# Patient Record
Sex: Male | Born: 1960 | Race: White | Hispanic: No | Smoking: Current every day smoker
Health system: Southern US, Community
[De-identification: ages and names within clinical notes are randomized; demographics above are authoritative.]

## PROBLEM LIST (undated history)

## (undated) DIAGNOSIS — F39 Unspecified mood [affective] disorder: Secondary | ICD-10-CM

## (undated) DIAGNOSIS — G0490 Encephalitis and encephalomyelitis, unspecified: Secondary | ICD-10-CM

## (undated) DIAGNOSIS — M25512 Pain in left shoulder: Secondary | ICD-10-CM

## (undated) DIAGNOSIS — R569 Unspecified convulsions: Secondary | ICD-10-CM

## (undated) DIAGNOSIS — Z5900 Homelessness unspecified: Secondary | ICD-10-CM

## (undated) DIAGNOSIS — A419 Sepsis, unspecified organism: Secondary | ICD-10-CM

## (undated) DIAGNOSIS — L03115 Cellulitis of right lower limb: Secondary | ICD-10-CM

## (undated) DIAGNOSIS — F1210 Cannabis abuse, uncomplicated: Secondary | ICD-10-CM

## (undated) DIAGNOSIS — G510 Bell's palsy: Secondary | ICD-10-CM

## (undated) DIAGNOSIS — G8929 Other chronic pain: Secondary | ICD-10-CM

## (undated) DIAGNOSIS — F4310 Post-traumatic stress disorder, unspecified: Secondary | ICD-10-CM

## (undated) DIAGNOSIS — F32A Depression, unspecified: Secondary | ICD-10-CM

## (undated) DIAGNOSIS — F319 Bipolar disorder, unspecified: Secondary | ICD-10-CM

## (undated) DIAGNOSIS — F329 Major depressive disorder, single episode, unspecified: Secondary | ICD-10-CM

## (undated) HISTORY — PX: KNEE SURGERY: SHX244

## (undated) HISTORY — DX: Bipolar disorder, unspecified: F31.9

## (undated) HISTORY — PX: APPENDECTOMY: SHX54

## (undated) HISTORY — PX: CHOLECYSTECTOMY: SHX55

## (undated) HISTORY — PX: FOOT SURGERY: SHX648

## (undated) HISTORY — PX: OTHER SURGICAL HISTORY: SHX169

---

## 2003-10-10 ENCOUNTER — Inpatient Hospital Stay (HOSPITAL_COMMUNITY): Admission: EM | Admit: 2003-10-10 | Discharge: 2003-10-13 | Payer: Self-pay | Admitting: Psychiatry

## 2003-11-24 ENCOUNTER — Emergency Department (HOSPITAL_COMMUNITY): Admission: AD | Admit: 2003-11-24 | Discharge: 2003-11-24 | Payer: Self-pay | Admitting: Emergency Medicine

## 2003-11-25 ENCOUNTER — Inpatient Hospital Stay (HOSPITAL_COMMUNITY): Admission: EM | Admit: 2003-11-25 | Discharge: 2003-11-28 | Payer: Self-pay | Admitting: Psychiatry

## 2004-06-02 ENCOUNTER — Other Ambulatory Visit: Payer: Self-pay

## 2004-07-19 ENCOUNTER — Inpatient Hospital Stay (HOSPITAL_COMMUNITY): Admission: EM | Admit: 2004-07-19 | Discharge: 2004-07-29 | Payer: Self-pay | Admitting: Psychiatry

## 2004-09-06 ENCOUNTER — Ambulatory Visit: Payer: Self-pay | Admitting: Psychiatry

## 2004-09-06 ENCOUNTER — Inpatient Hospital Stay (HOSPITAL_COMMUNITY): Admission: EM | Admit: 2004-09-06 | Discharge: 2004-09-16 | Payer: Self-pay | Admitting: Psychiatry

## 2004-12-16 ENCOUNTER — Inpatient Hospital Stay (HOSPITAL_COMMUNITY): Admission: RE | Admit: 2004-12-16 | Discharge: 2004-12-30 | Payer: Self-pay | Admitting: Psychiatry

## 2004-12-16 ENCOUNTER — Ambulatory Visit: Payer: Self-pay | Admitting: Psychiatry

## 2005-06-04 ENCOUNTER — Ambulatory Visit: Payer: Self-pay | Admitting: Psychiatry

## 2005-06-04 ENCOUNTER — Encounter: Payer: Self-pay | Admitting: Emergency Medicine

## 2005-06-04 ENCOUNTER — Inpatient Hospital Stay (HOSPITAL_COMMUNITY): Admission: EM | Admit: 2005-06-04 | Discharge: 2005-06-09 | Payer: Self-pay | Admitting: Psychiatry

## 2005-07-29 ENCOUNTER — Emergency Department (HOSPITAL_COMMUNITY): Admission: EM | Admit: 2005-07-29 | Discharge: 2005-07-30 | Payer: Self-pay | Admitting: Emergency Medicine

## 2005-08-03 ENCOUNTER — Emergency Department (HOSPITAL_COMMUNITY): Admission: EM | Admit: 2005-08-03 | Discharge: 2005-08-03 | Payer: Self-pay | Admitting: *Deleted

## 2006-03-14 ENCOUNTER — Ambulatory Visit: Payer: Self-pay | Admitting: *Deleted

## 2006-03-14 ENCOUNTER — Inpatient Hospital Stay (HOSPITAL_COMMUNITY): Admission: RE | Admit: 2006-03-14 | Discharge: 2006-03-21 | Payer: Self-pay | Admitting: *Deleted

## 2006-04-12 ENCOUNTER — Inpatient Hospital Stay (HOSPITAL_COMMUNITY): Admission: RE | Admit: 2006-04-12 | Discharge: 2006-04-19 | Payer: Self-pay | Admitting: Psychiatry

## 2006-04-13 ENCOUNTER — Ambulatory Visit: Payer: Self-pay | Admitting: Psychiatry

## 2006-05-05 ENCOUNTER — Emergency Department (HOSPITAL_COMMUNITY): Admission: EM | Admit: 2006-05-05 | Discharge: 2006-05-05 | Payer: Self-pay | Admitting: Emergency Medicine

## 2006-05-11 ENCOUNTER — Emergency Department (HOSPITAL_COMMUNITY): Admission: EM | Admit: 2006-05-11 | Discharge: 2006-05-11 | Payer: Self-pay | Admitting: Emergency Medicine

## 2006-05-17 ENCOUNTER — Emergency Department (HOSPITAL_COMMUNITY): Admission: EM | Admit: 2006-05-17 | Discharge: 2006-05-17 | Payer: Self-pay | Admitting: Emergency Medicine

## 2006-05-27 ENCOUNTER — Emergency Department (HOSPITAL_COMMUNITY): Admission: EM | Admit: 2006-05-27 | Discharge: 2006-05-27 | Payer: Self-pay | Admitting: Emergency Medicine

## 2006-06-11 ENCOUNTER — Inpatient Hospital Stay (HOSPITAL_COMMUNITY): Admission: AD | Admit: 2006-06-11 | Discharge: 2006-06-20 | Payer: Self-pay | Admitting: *Deleted

## 2006-06-12 ENCOUNTER — Ambulatory Visit: Payer: Self-pay | Admitting: *Deleted

## 2006-09-14 ENCOUNTER — Emergency Department (HOSPITAL_COMMUNITY): Admission: EM | Admit: 2006-09-14 | Discharge: 2006-09-15 | Payer: Self-pay | Admitting: Emergency Medicine

## 2006-10-18 ENCOUNTER — Emergency Department (HOSPITAL_COMMUNITY): Admission: EM | Admit: 2006-10-18 | Discharge: 2006-10-18 | Payer: Self-pay | Admitting: Family Medicine

## 2006-10-18 ENCOUNTER — Emergency Department (HOSPITAL_COMMUNITY): Admission: EM | Admit: 2006-10-18 | Discharge: 2006-10-18 | Payer: Self-pay | Admitting: Emergency Medicine

## 2006-12-04 ENCOUNTER — Emergency Department (HOSPITAL_COMMUNITY): Admission: EM | Admit: 2006-12-04 | Discharge: 2006-12-05 | Payer: Self-pay | Admitting: Emergency Medicine

## 2006-12-08 ENCOUNTER — Emergency Department (HOSPITAL_COMMUNITY): Admission: EM | Admit: 2006-12-08 | Discharge: 2006-12-08 | Payer: Self-pay | Admitting: Emergency Medicine

## 2006-12-23 ENCOUNTER — Inpatient Hospital Stay (HOSPITAL_COMMUNITY): Admission: AD | Admit: 2006-12-23 | Discharge: 2006-12-27 | Payer: Self-pay | Admitting: *Deleted

## 2006-12-23 ENCOUNTER — Ambulatory Visit: Payer: Self-pay | Admitting: *Deleted
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
## 2007-01-28 IMAGING — CR DG CHEST 2V
2 series · 2 of 2 positions shown · non-contrast
Comparison: 05/05/2006.

CLINICAL DATA: Dyspnea.  Cough. 
 CHEST - 2 VIEW:

[w chest pa]
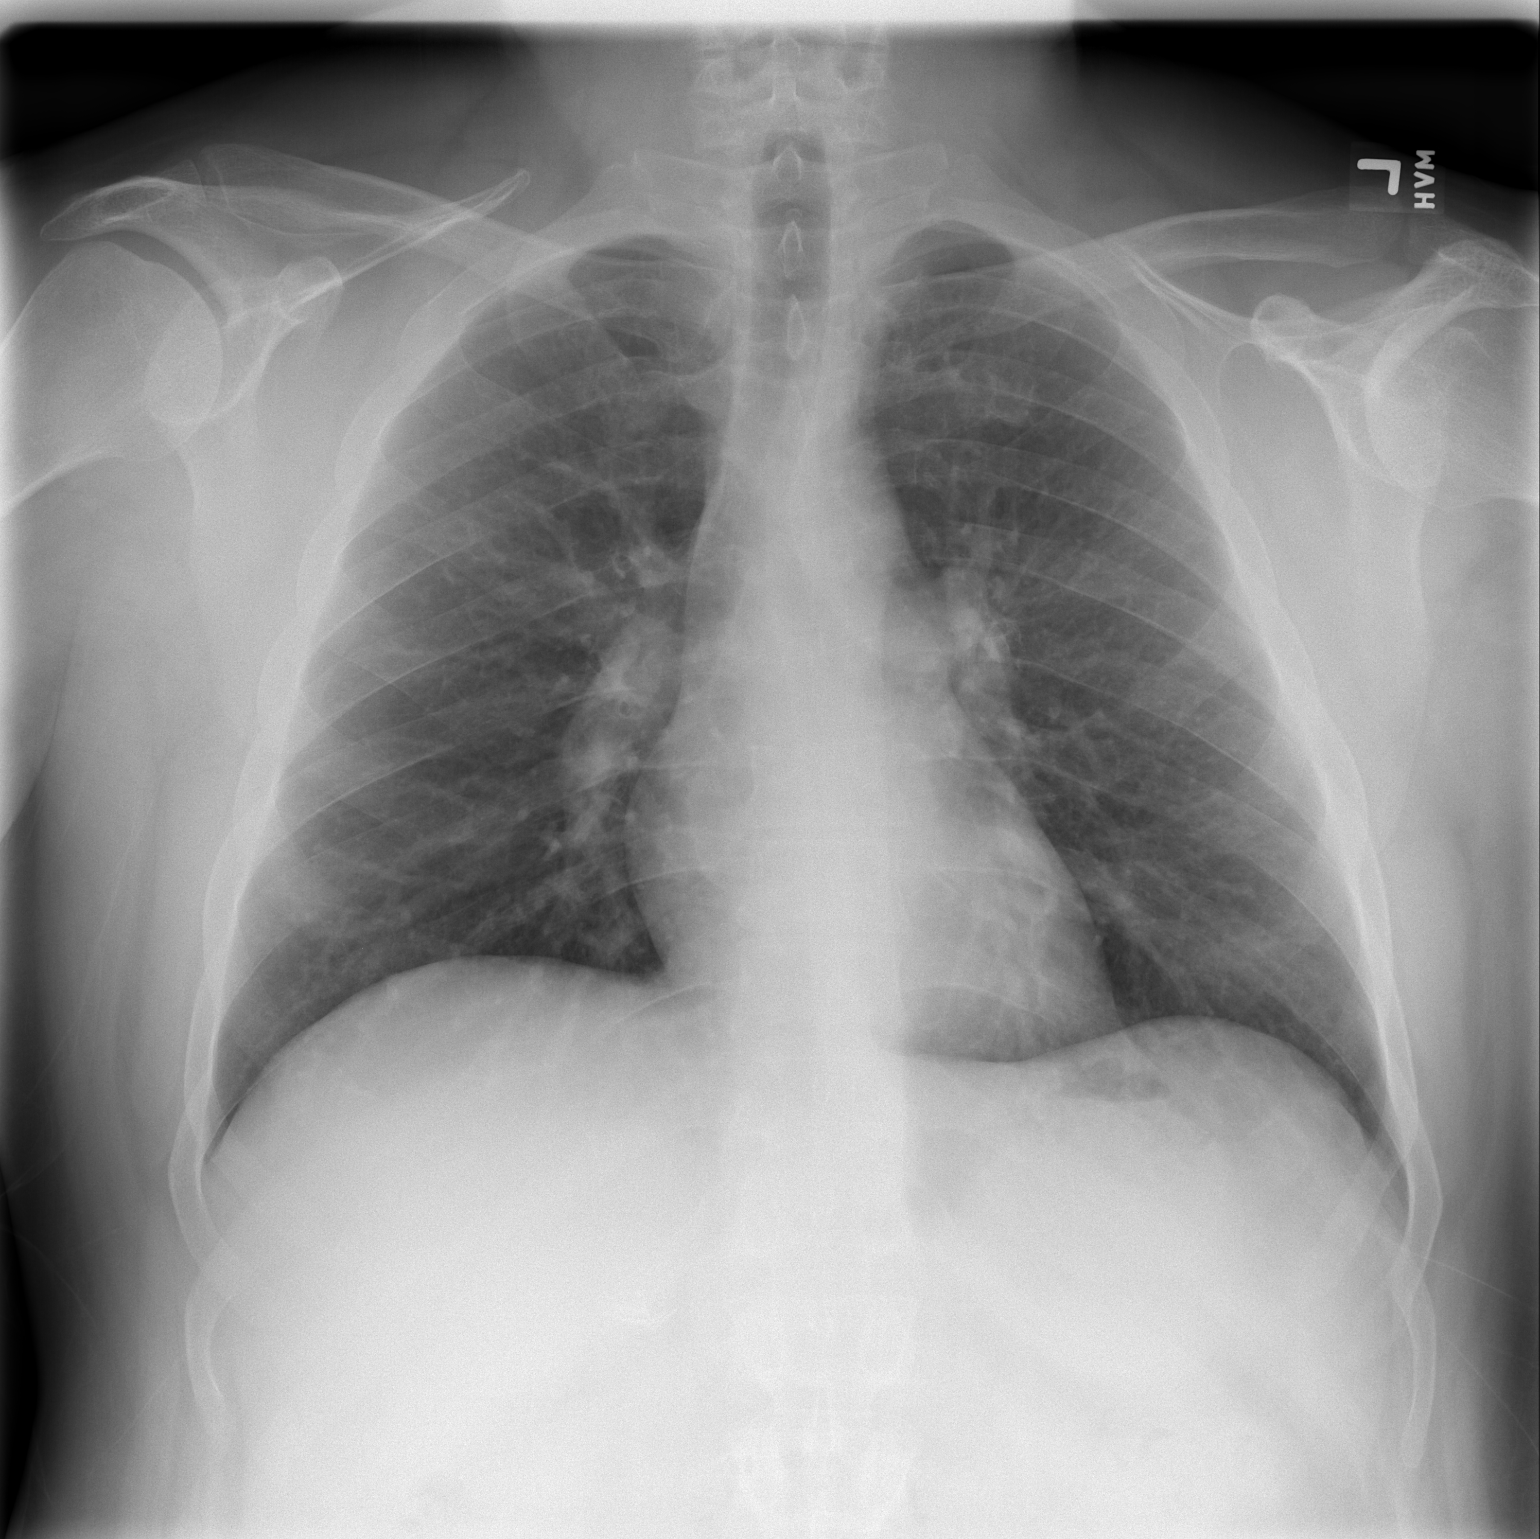

[w chest lat]
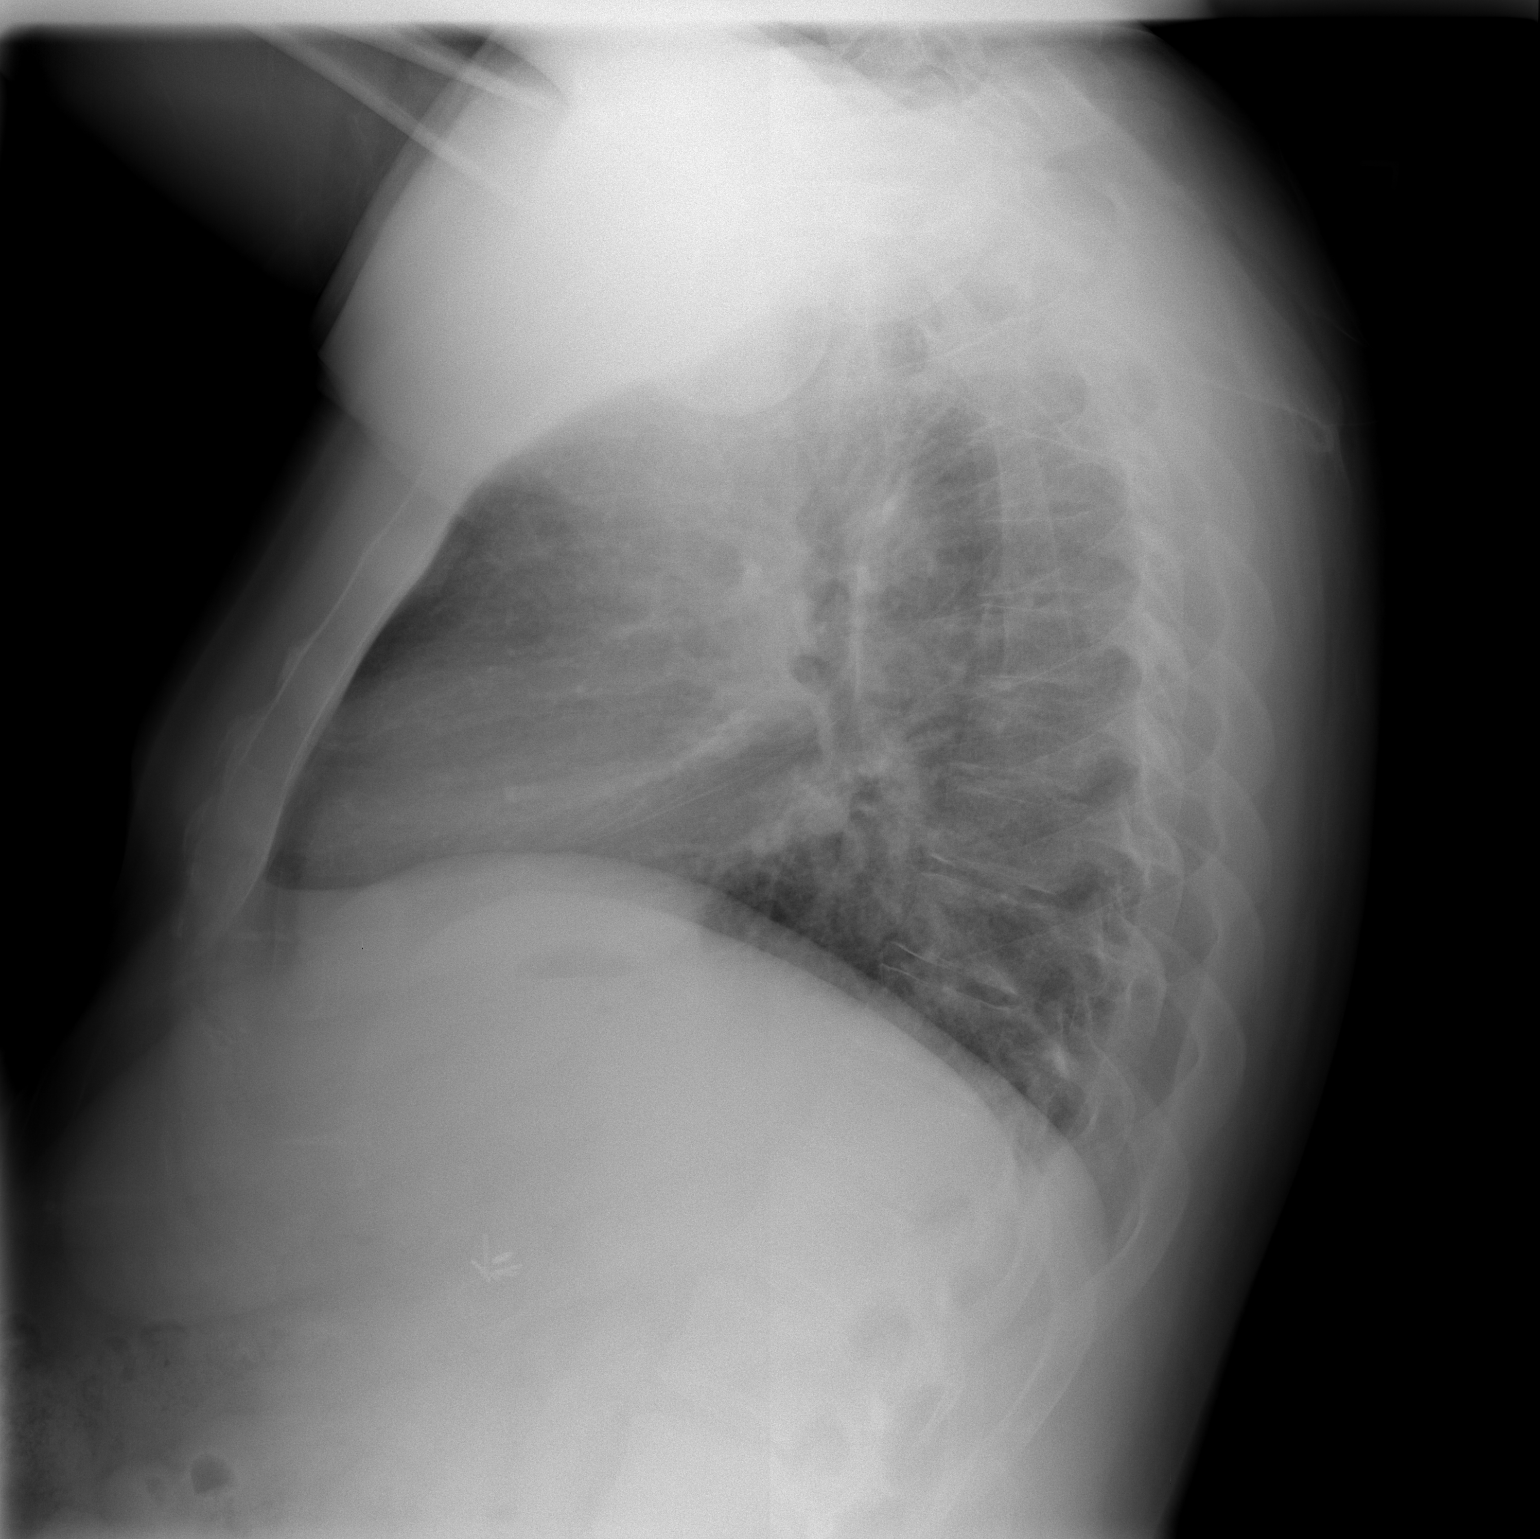

[2 of 2 positions shown; findings below may reference images not displayed]

Cardiomediastinal silhouette is stable.  Mild peribronchial thickening is unchanged.  The lungs are otherwise clear.  No evidence of pleural effusions or pneumothorax.
IMPRESSION: 1.  No evidence of acute cardiopulmonary disease. 
 2.  Chronic bronchitic changes.

## 2007-01-31 ENCOUNTER — Emergency Department (HOSPITAL_COMMUNITY): Admission: EM | Admit: 2007-01-31 | Discharge: 2007-01-31 | Payer: Self-pay | Admitting: Emergency Medicine

## 2007-02-10 ENCOUNTER — Emergency Department (HOSPITAL_COMMUNITY): Admission: EM | Admit: 2007-02-10 | Discharge: 2007-02-11 | Payer: Self-pay | Admitting: Emergency Medicine

## 2007-02-10 ENCOUNTER — Ambulatory Visit: Payer: Self-pay | Admitting: *Deleted

## 2014-09-10 ENCOUNTER — Emergency Department (HOSPITAL_COMMUNITY): Payer: Medicare HMO

## 2014-09-10 ENCOUNTER — Encounter (HOSPITAL_COMMUNITY): Payer: Self-pay | Admitting: Emergency Medicine

## 2014-09-10 ENCOUNTER — Emergency Department (HOSPITAL_COMMUNITY)
Admission: EM | Admit: 2014-09-10 | Discharge: 2014-09-11 | Disposition: A | Discharge status: Other Acute Inpatient Hospital | Payer: Medicare HMO | Source: Home / Self Care | Attending: Emergency Medicine | Admitting: Emergency Medicine

## 2014-09-10 DIAGNOSIS — Z79899 Other long term (current) drug therapy: Secondary | ICD-10-CM

## 2014-09-10 DIAGNOSIS — Z8669 Personal history of other diseases of the nervous system and sense organs: Secondary | ICD-10-CM

## 2014-09-10 DIAGNOSIS — F919 Conduct disorder, unspecified: Secondary | ICD-10-CM

## 2014-09-10 DIAGNOSIS — F329 Major depressive disorder, single episode, unspecified: Secondary | ICD-10-CM | POA: Insufficient documentation

## 2014-09-10 DIAGNOSIS — F332 Major depressive disorder, recurrent severe without psychotic features: Secondary | ICD-10-CM | POA: Diagnosis not present

## 2014-09-10 DIAGNOSIS — G40909 Epilepsy, unspecified, not intractable, without status epilepticus: Secondary | ICD-10-CM

## 2014-09-10 DIAGNOSIS — Z720 Tobacco use: Secondary | ICD-10-CM

## 2014-09-10 DIAGNOSIS — R45851 Suicidal ideations: Secondary | ICD-10-CM

## 2014-09-10 DIAGNOSIS — F32A Depression, unspecified: Secondary | ICD-10-CM

## 2014-09-10 DIAGNOSIS — R4689 Other symptoms and signs involving appearance and behavior: Secondary | ICD-10-CM

## 2014-09-10 HISTORY — DX: Bell's palsy: G51.0

## 2014-09-10 HISTORY — DX: Post-traumatic stress disorder, unspecified: F43.10

## 2014-09-10 HISTORY — DX: Depression, unspecified: F32.A

## 2014-09-10 HISTORY — DX: Unspecified convulsions: R56.9

## 2014-09-10 HISTORY — DX: Major depressive disorder, single episode, unspecified: F32.9

## 2014-09-10 LAB — COMPREHENSIVE METABOLIC PANEL
ALK PHOS: 56 U/L (ref 39–117)
ALT: 19 U/L (ref 0–53)
ANION GAP: 12 (ref 5–15)
AST: 18 U/L (ref 0–37)
Albumin: 3.8 g/dL (ref 3.5–5.2)
BUN: 22 mg/dL (ref 6–23)
CALCIUM: 8.9 mg/dL (ref 8.4–10.5)
CO2: 27 meq/L (ref 19–32)
Chloride: 105 mEq/L (ref 96–112)
Creatinine, Ser: 0.88 mg/dL (ref 0.50–1.35)
GLUCOSE: 88 mg/dL (ref 70–99)
POTASSIUM: 3.8 meq/L (ref 3.7–5.3)
Sodium: 144 mEq/L (ref 137–147)
TOTAL PROTEIN: 6.9 g/dL (ref 6.0–8.3)
Total Bilirubin: 0.2 mg/dL — ABNORMAL LOW (ref 0.3–1.2)

## 2014-09-10 LAB — CBC
HEMATOCRIT: 44.2 % (ref 39.0–52.0)
HEMOGLOBIN: 15.2 g/dL (ref 13.0–17.0)
MCH: 29.2 pg (ref 26.0–34.0)
MCHC: 34.4 g/dL (ref 30.0–36.0)
MCV: 84.8 fL (ref 78.0–100.0)
Platelets: 235 10*3/uL (ref 150–400)
RBC: 5.21 MIL/uL (ref 4.22–5.81)
RDW: 14 % (ref 11.5–15.5)
WBC: 8.2 10*3/uL (ref 4.0–10.5)

## 2014-09-10 LAB — SALICYLATE LEVEL: Salicylate Lvl: <2 mg/dL — ABNORMAL LOW (ref 2.8–20.0)

## 2014-09-10 LAB — RAPID URINE DRUG SCREEN, HOSP PERFORMED
Amphetamines: NOT DETECTED
Barbiturates: NOT DETECTED
Benzodiazepines: NOT DETECTED
Cocaine: NOT DETECTED
OPIATES: NOT DETECTED
TETRAHYDROCANNABINOL: NOT DETECTED

## 2014-09-10 LAB — ACETAMINOPHEN LEVEL: Acetaminophen (Tylenol), Serum: <15 ug/mL (ref 10–30)

## 2014-09-10 LAB — VALPROIC ACID LEVEL: Valproic Acid Lvl: 10.8 ug/mL — ABNORMAL LOW (ref 50.0–100.0)

## 2014-09-10 LAB — ETHANOL: Alcohol, Ethyl (B): <11 mg/dL (ref 0–11)

## 2014-09-10 MED ORDER — DIVALPROEX SODIUM ER 500 MG PO TB24
1000.0000 mg | ORAL_TABLET | Freq: Every evening | ORAL | Status: DC
  Administered 2014-09-11: 1000 mg via ORAL
  Filled 2014-09-10: qty 2

## 2014-09-10 MED ORDER — NICOTINE 21 MG/24HR TD PT24
21.0000 mg | MEDICATED_PATCH | Freq: Every day | TRANSDERMAL | Status: DC
  Administered 2014-09-10: 21 mg via TRANSDERMAL
  Filled 2014-09-10: qty 1

## 2014-09-10 MED ORDER — IBUPROFEN 800 MG PO TABS
800.0000 mg | ORAL_TABLET | Freq: Two times a day (BID) | ORAL | Status: DC
  Administered 2014-09-11: 800 mg via ORAL
  Filled 2014-09-10: qty 1

## 2014-09-10 MED ORDER — CITALOPRAM HYDROBROMIDE 10 MG PO TABS: 10.0000 mg | ORAL_TABLET | Freq: Every day | ORAL | Status: DC

## 2014-09-10 MED ORDER — QUETIAPINE FUMARATE 25 MG PO TABS
150.0000 mg | ORAL_TABLET | Freq: Every day | ORAL | Status: DC
  Administered 2014-09-10: 150 mg via ORAL
  Filled 2014-09-10: qty 6

## 2014-09-10 MED ORDER — CITALOPRAM HYDROBROMIDE 10 MG PO TABS
10.0000 mg | ORAL_TABLET | Freq: Every evening | ORAL | Status: DC
  Administered 2014-09-10: 10 mg via ORAL
  Filled 2014-09-10: qty 1

## 2014-09-10 MED ORDER — QUETIAPINE FUMARATE 25 MG PO TABS: 150.0000 mg | ORAL_TABLET | Freq: Every day | ORAL | Status: DC

## 2014-09-10 MED ORDER — DIVALPROEX SODIUM ER 500 MG PO TB24: 1000.0000 mg | ORAL_TABLET | Freq: Every evening | ORAL | Status: DC
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
## 2014-09-10 NOTE — ED Notes (Signed)
Pt reports recent SI w/ a plan to overdose on medications - pt admits to intermittent depression over the past 7127yrs however states he has not been suicidal in 6027yrs since he began his psych medications, pt is concerned and feels his medications may need to be adjusted. Pt is A&Ox4 - unable to recall date however knows it is currently Oct 2015.
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
## 2014-09-10 NOTE — ED Notes (Signed)
Pt to radiology with sitter.;

## 2014-09-10 NOTE — ED Notes (Signed)
Patient transported to X-ray 

## 2014-09-11 ENCOUNTER — Inpatient Hospital Stay (HOSPITAL_COMMUNITY)
Admission: RE | Admit: 2014-09-11 | Discharge: 2014-09-17 | DRG: 885 | Disposition: A | Discharge status: Home / Self Care | Payer: Medicare HMO | Source: Intra-hospital | Attending: Psychiatry | Admitting: Psychiatry

## 2014-09-11 ENCOUNTER — Encounter (HOSPITAL_COMMUNITY): Payer: Self-pay | Admitting: *Deleted

## 2014-09-11 DIAGNOSIS — R51 Headache: Secondary | ICD-10-CM | POA: Diagnosis present

## 2014-09-11 DIAGNOSIS — F39 Unspecified mood [affective] disorder: Secondary | ICD-10-CM | POA: Diagnosis present

## 2014-09-11 DIAGNOSIS — Z23 Encounter for immunization: Secondary | ICD-10-CM | POA: Diagnosis not present

## 2014-09-11 DIAGNOSIS — R45851 Suicidal ideations: Secondary | ICD-10-CM | POA: Diagnosis present

## 2014-09-11 DIAGNOSIS — F419 Anxiety disorder, unspecified: Secondary | ICD-10-CM | POA: Diagnosis present

## 2014-09-11 DIAGNOSIS — F17210 Nicotine dependence, cigarettes, uncomplicated: Secondary | ICD-10-CM | POA: Diagnosis present

## 2014-09-11 DIAGNOSIS — F1290 Cannabis use, unspecified, uncomplicated: Secondary | ICD-10-CM | POA: Diagnosis present

## 2014-09-11 DIAGNOSIS — F4310 Post-traumatic stress disorder, unspecified: Secondary | ICD-10-CM | POA: Diagnosis present

## 2014-09-11 DIAGNOSIS — G4700 Insomnia, unspecified: Secondary | ICD-10-CM | POA: Diagnosis present

## 2014-09-11 DIAGNOSIS — F332 Major depressive disorder, recurrent severe without psychotic features: Principal | ICD-10-CM | POA: Diagnosis present

## 2014-09-11 DIAGNOSIS — F322 Major depressive disorder, single episode, severe without psychotic features: Secondary | ICD-10-CM | POA: Diagnosis present

## 2014-09-11 LAB — TSH: TSH: 2.38 u[IU]/mL (ref 0.350–4.500)

## 2014-09-11 MED ORDER — ALUM & MAG HYDROXIDE-SIMETH 200-200-20 MG/5ML PO SUSP
30.0000 mL | ORAL | Status: DC | PRN
  Administered 2014-09-13 – 2014-09-15 (×2): 30 mL via ORAL

## 2014-09-11 MED ORDER — CITALOPRAM HYDROBROMIDE 10 MG PO TABS
10.0000 mg | ORAL_TABLET | Freq: Every day | ORAL | Status: DC
  Administered 2014-09-11: 10 mg via ORAL
  Filled 2014-09-11 (×3): qty 1

## 2014-09-11 MED ORDER — DIVALPROEX SODIUM ER 500 MG PO TB24
1500.0000 mg | ORAL_TABLET | Freq: Every evening | ORAL | Status: DC
  Administered 2014-09-11 – 2014-09-12 (×2): 1500 mg via ORAL
  Administered 2014-09-13: 500 mg via ORAL
  Administered 2014-09-14 – 2014-09-16 (×3): 1500 mg via ORAL
  Filled 2014-09-11 (×4): qty 3
  Filled 2014-09-11 (×2): qty 9
  Filled 2014-09-11 (×4): qty 3

## 2014-09-11 MED ORDER — ACETAMINOPHEN 325 MG PO TABS
650.0000 mg | ORAL_TABLET | Freq: Four times a day (QID) | ORAL | Status: DC | PRN
  Administered 2014-09-13 – 2014-09-15 (×3): 650 mg via ORAL
  Filled 2014-09-11 (×3): qty 2

## 2014-09-11 MED ORDER — IBUPROFEN 800 MG PO TABS
800.0000 mg | ORAL_TABLET | Freq: Two times a day (BID) | ORAL | Status: DC
  Administered 2014-09-11 – 2014-09-17 (×13): 800 mg via ORAL
  Filled 2014-09-11 (×19): qty 1

## 2014-09-11 MED ORDER — DIVALPROEX SODIUM ER 500 MG PO TB24
1000.0000 mg | ORAL_TABLET | Freq: Every evening | ORAL | Status: DC
  Filled 2014-09-11 (×2): qty 2

## 2014-09-11 MED ORDER — HYDROXYZINE HCL 25 MG PO TABS
25.0000 mg | ORAL_TABLET | Freq: Four times a day (QID) | ORAL | Status: DC | PRN
  Administered 2014-09-14 – 2014-09-16 (×5): 25 mg via ORAL
  Filled 2014-09-11 (×5): qty 1

## 2014-09-11 MED ORDER — MAGNESIUM HYDROXIDE 400 MG/5ML PO SUSP: 30.0000 mL | Freq: Every day | ORAL | Status: DC | PRN

## 2014-09-11 MED ORDER — PNEUMOCOCCAL VAC POLYVALENT 25 MCG/0.5ML IJ INJ
0.5000 mL | INJECTION | INTRAMUSCULAR | Status: AC
  Administered 2014-09-12: 0.5 mL via INTRAMUSCULAR

## 2014-09-11 MED ORDER — NICOTINE 21 MG/24HR TD PT24
21.0000 mg | MEDICATED_PATCH | Freq: Every day | TRANSDERMAL | Status: DC
  Administered 2014-09-11 – 2014-09-17 (×7): 21 mg via TRANSDERMAL
  Filled 2014-09-11 (×10): qty 1

## 2014-09-11 MED ORDER — TRAZODONE HCL 50 MG PO TABS
50.0000 mg | ORAL_TABLET | Freq: Every evening | ORAL | Status: DC | PRN
  Administered 2014-09-13 – 2014-09-15 (×6): 50 mg via ORAL
  Filled 2014-09-11 (×4): qty 1
  Filled 2014-09-11 (×2): qty 6
  Filled 2014-09-11 (×3): qty 1
  Filled 2014-09-11: qty 6
  Filled 2014-09-11 (×7): qty 1
  Filled 2014-09-11: qty 6
  Filled 2014-09-11 (×2): qty 1

## 2014-09-11 MED ORDER — OLANZAPINE 5 MG PO TBDP
5.0000 mg | ORAL_TABLET | Freq: Every evening | ORAL | Status: DC | PRN
  Administered 2014-09-11 – 2014-09-12 (×2): 5 mg via ORAL
  Filled 2014-09-11 (×2): qty 1

## 2014-09-11 MED ORDER — CLONAZEPAM 1 MG PO TABS
1.0000 mg | ORAL_TABLET | Freq: Two times a day (BID) | ORAL | Status: DC | PRN
  Administered 2014-09-11 – 2014-09-12 (×4): 1 mg via ORAL
  Filled 2014-09-11 (×4): qty 1

## 2014-09-11 NOTE — Progress Notes (Signed)
Patient ID: Anthony [name], male   DOB: 07/08/1961, 53 y.o.   MRN: [id number] Received report from Kim B. RN  D: Client in bed eyes closed, respirations even. A: Writer observed s/s of distress. Staff will monitor q5215min for safety. R: Client is safe on unit, no distress noted.
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
## 2014-09-11 NOTE — Progress Notes (Signed)
Patient ID: St. Leo BingJeffrey Jerrett, male   DOB: Nov 24, 1961, 53 y.o.   MRN: [id number] PER STATE REGULATIONS 482.30  THIS CHART WAS REVIEWED FOR MEDICAL NECESSITY WITH RESPECT TO THE PATIENT'S ADMISSION/ DURATION OF STAY.  NEXT REVIEW DATE: 09/15/2014  Willa RoughJENNIFER JONES Juanelle Trueheart, RN, BSN CASE MANAGER

## 2014-09-11 NOTE — [facility] Group Notes (Signed)
Adult Psychoeducational Group Note  Date:  09/11/2014 Time:  10:30 PM  Group Topic/Focus:  NA Meeting  Participation Level:  Minimal  Participation Quality:  Appropriate and Attentive  Affect:  Flat  Cognitive:  Alert  Insight: Good  Engagement in Group:  Limited  Modes of Intervention:  Discussion and Education  Additional Comments:  Anthony [name] attended group.  Anthony [name], Anthony [name] 09/11/2014, 10:30 PM

## 2014-09-11 NOTE — Clinical Social Work Note (Signed)
CSW attempted to complete PSA, patient asleep and difficult to awaken. CSW to complete PSA at another time.  Samuella BruinKristin Jalesha Plotz, MSW, [employer] IncLCSWA Clinical Social Worker [facility] [phone]
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
## 2014-09-11 NOTE — [facility] Group Notes (Signed)
[facility] LCSW Group Therapy 09/11/2014  1:15 PM   Type of Therapy: Group Therapy  Participation Level: Did Not Attend.   Laqueta Bonaventura, MSW, LCSWA Clinical Social Worker [facility] [phone]   
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
## 2014-09-11 NOTE — [facility] Group Notes (Signed)
[facility]  09/11/2014  8:45 AM  Participation Quality: Did Not Attend.  Samuella BruinKristin Haleigh Desmith, MSW, [employer]CSWA Clinical Social Worker [facility] [phone]
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
## 2014-09-12 DIAGNOSIS — F4310 Post-traumatic stress disorder, unspecified: Secondary | ICD-10-CM | POA: Diagnosis present

## 2014-09-12 DIAGNOSIS — F332 Major depressive disorder, recurrent severe without psychotic features: Secondary | ICD-10-CM | POA: Diagnosis not present

## 2014-09-12 NOTE — Progress Notes (Signed)
The focus of this group is to educate the patient on the purpose and policies of crisis stabilization and provide a format to answer questions about their admission.  The group details unit policies and expectations of patients while admitted. Patient did not attend this group. 
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
## 2014-09-12 NOTE — Progress Notes (Signed)
Patient ID: Anthony [name], male   DOB: 03/03/1961, 53 y.o.   MRN: [id number] D: Patient reported "I did not feel like killing myself today". Pt reports his medication regimen is improving his mood and also leaving home and not dealing with his fiance's running her mouth. Pt mood and affect appeared depressed and flat. Pt denies SI/HI/AVH and pain. Pt attended evening NA group and engage in discussion. Cooperative with assessment. No acute distressed noted at this time.   A: Met with pt 1:1. Medications administered as prescribed. Writer encouraged pt to discuss feelings. Pt encouraged to come to staff with any question or concerns.   R: Patient remains safe. He is complaint with medications and denies any adverse reaction.

## 2014-09-12 NOTE — Progress Notes (Signed)
Adult Psychoeducational Group Note  Date:  09/12/2014 Time:  10:55 PM  Group Topic/Focus:  Wrap-Up Group:   The focus of this group is to help patients review their daily goal of treatment and discuss progress on daily workbooks.  Participation Level:  Active  Participation Quality:  Appropriate  Affect:  Appropriate  Cognitive:  Alert  Insight: Appropriate  Engagement in Group:  Engaged  Modes of Intervention:  Discussion  Additional Comments:  Pt stated that he felt "up and down" all day because he's Bipolar. He also stated that his medications are starting to work. His goal is to [facility]. He has been pleasant and cooperative all evening.   [place] Shihomas, Mayrene Bastarache K 09/12/2014, 10:55 PM

## 2014-09-12 NOTE — [facility] Suicide Risk Assessment (Signed)
[facility] INPATIENT:  Family/Significant Other Suicide Prevention Education  Suicide Prevention Education:  Education Completed; girlfriend Anthony [name] [phone] ,  (name of family member/significant other) has been identified by the patient as the family member/significant other with whom the patient will be residing, and identified as the person(s) who will aid the patient in the event of a mental health crisis (suicidal ideations/suicide attempt).  With written consent from the patient, the family member/significant other has been provided the following suicide prevention education, prior to the and/or following the discharge of the patient.  The suicide prevention education provided includes the following:  Suicide risk factors  Suicide prevention and interventions  National Suicide Hotline telephone number  [facility] assessment telephone number  [facility] Emergency Assistance 911  [facility] and/or Residential Mobile Crisis Unit telephone number  Request made of family/significant other to:  Remove weapons (e.g., guns, rifles, knives), all items previously/currently identified as safety concern.    Remove drugs/medications (over-the-counter, prescriptions, illicit drugs), all items previously/currently identified as a safety concern.  The family member/significant other verbalizes understanding of the suicide prevention education information provided.  The family member/significant other agrees to remove the items of safety concern listed above.  Anthony [name], Anthony [name] 09/12/2014, 1:01 PM
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
## 2014-09-12 NOTE — [facility] Group Notes (Signed)
[facility] LCSW Group Therapy 09/12/2014 1:15 PM Type of Therapy: Group Therapy Participation Level: Active  Participation Quality: Attentive, Sharing and Supportive  Affect: Depressed and Flat  Cognitive: Alert and Oriented  Insight: Developing/Improving and Engaged  Engagement in Therapy: Developing/Improving and Engaged  Modes of Intervention: Activity, Clarification, Confrontation, Discussion, Education, Exploration, Limit-setting, Orientation, Problem-solving, Rapport Building, Dance movement psychotherapisteality Testing, Socialization and Support  Summary of Progress/Problems: Patient was attentive and engaged with speaker from [employer]. Patient was attentive to speaker while they shared their story of dealing with mental health and overcoming it. Patient expressed interest in their programs and services and received information on their agency. Patient processed ways they can relate to the speaker. Patient attended group but was observed sleeping during most of presentation.  Samuella BruinKristin Pang Robers, MSW, [employer]CSWA Clinical Social Worker [facility] [phone]

## 2014-09-12 NOTE — Progress Notes (Signed)
Patient ID: Anthony [name], male   DOB: September 14, 1961, 53 y.o.   MRN: [id number] He has been up and to three groups today.  He  Voided in the bed last night and he did take a shower. He stated" I slept all night the 1st time in a long time".  He is a fall rist due to his unsteady gait..Marland Kitchen

## 2014-09-13 DIAGNOSIS — F4310 Post-traumatic stress disorder, unspecified: Secondary | ICD-10-CM

## 2014-09-13 NOTE — Progress Notes (Signed)
09/13/14 2309 09/13/14 2330 09/13/14 2348  What Happened  Was fall witnessed? --  --  No  Was patient injured? --  --  No  Patient found --  --  on floor  Found by --  --  Staff-comment  Stated prior activity --  --  other (comment) (pt reported he was trying to move table next to bed)  Follow Up  MD notified --  --  Eloisa Northern(Kober)  Time MD notified --  --  2330  Family notified --  --  No- patient refusal  Additional tests --  --  No  Progress note created (see row info) --  --  Yes  Adult Fall Risk Assessment  Risk Factor Category (scoring not indicated) --  --  Fall has occurred during this admission (document High fall risk)  Age --  --  0  Fall History: Fall within 6 months prior to admission --  --  5  Elimination; Bowel and/or Urine Incontinence --  --  0  Elimination; Bowel and/or Urine Urgency/Frequency --  --  0  Medications: includes PCA/Opiates, Anti-convulsants, Anti-hypertensives, Diuretics, Hypnotics, Laxatives, Sedatives, and Psychotropics --  --  5  Patient Care Equipment --  --  2  Mobility-Assistance --  --  2  Mobility-Gait --  --  2  Mobility-Sensory Deficit --  --  0  Cognition-Awareness --  --  0  Cognition-Impulsiveness --  --  0  Cognition-Limitations --  --  4  Total Score --  --  20  Patient's Fall Risk --  --  High Fall Risk (>13 points)  Adult Fall Risk Interventions  Required Bundle Interventions *See Row Information* --  --  High fall risk - low, moderate, and high requirements implemented  Additional Interventions --  --  Assess orthostatic BP;Fall risk signage;Individualized elimination schedule;Specialty bed:  Low bed;24 hour supervision/sitter  Vitals  Temp 97.5 F (36.4 C) --  --   Temp Source Oral --  --   BP 115/62 mmHg 107/85 mmHg --   BP Method Automatic Automatic --   Patient Position (if appropriate) Lying Sitting --   Pulse Rate 75 84 --   Pulse Rate Source Dinamap Dinamap --   Resp 18 16 --   Oxygen Therapy  SpO2 95 % --  --   Pain  Assessment  Pain Assessment --  --  No/denies pain  Pain Score --  --  0  Neurological  Neuro (WDL) --  --  WDL  Musculoskeletal  Musculoskeletal (WDL) --  --  WDL  Integumentary  Integumentary (WDL) --  --  WDL

## 2014-09-13 NOTE — [facility] Group Notes (Signed)
[facility]   09/13/2014 12:29 PM  Participation Quality:  Patient did not attend group - patient in bed.    Anthony [name], Anthony [name]

## 2014-09-13 NOTE — Progress Notes (Signed)
Pt observed walking using his walker and he has significant diff remembering to walk INTO the walker and to consistently pick up his right foot  When he walks and he is seen walking down the hall leaning to the right, dragging his foot . He refuses to use a wheel chair but he will agree to use the call bell and call for help when he has to get OOB. He is moved from bed #2 ( by the window) to bed #1 ( closest to the call bell jack in the wall)and   Team is updated on need for pt to use call bell and rely on staff help when he has to get OOB.

## 2014-09-13 NOTE — Plan of Care (Signed)
Problem: Diagnosis: Increased Risk For Suicide Attempt Goal: STG-Patient Will Comply With Medication Regime Outcome: Progressing Pt compliant with medication regime this shift

## 2014-09-13 NOTE — Tx Team (Signed)
Interdisciplinary Treatment Plan Update   Date Reviewed:  09/13/2014  Time Reviewed:  8:45 AM  Progress in Treatment:   Attending groups: Yes Participating in groups: Yes Taking medication as prescribed: Yes  Tolerating medication: Yes Family/Significant other contact made:  No, but will ask patient for consent for collateral contact Patient understands diagnosis: Yes  Discussing patient identified problems/goals with staff: Yes Medical problems stabilized or resolved: Yes Denies suicidal/homicidal ideation: Yes Patient has not harmed self or others: Yes  For review of initial/current patient goals, please see plan of care.  Estimated Length of Stay:  3-5 days  Reasons for Continued Hospitalization:  Anxiety Depression Medication stabilization   New Problems/Goals identified:    Discharge Plan or Barriers:   Home with outpatient follow up to be determined  Additional Comments:  53 Y/O male who states he has been feeling increasingly more depressed and suicidal for the last several months. He states that being disabled is getting to him. He states he feels the medications are not working. Thinks that they might be making things worst for him. He admits to having mood instability, with episodic agressiveness   Patient and CSW reviewed patients identified goals and treatment plan.  Patient verbalized understanding and agreed to treatment plan.   Attendees:  Patient:  09/13/2014 8:45 AM   Signature:  Sallyanne HaversF. Cobos, MD 09/13/2014 8:45 AM  Signature: Geoffery LyonsIrving Lugo, MD 09/13/2014 8:45 AM  Signature: Lamount Crankerhris Judge, RN 09/13/2014 8:45 AM  Signature:  Robbie LouisVivian Kent, RN 09/13/2014 8:45 AM  Signature:  Genelle GatherPatti Dukes, RN 09/13/2014 8:45 AM  Signature:  Juline PatchQuylle Brailyn Delman, LCSW 09/13/2014 8:45 AM  Signature:  Belenda CruiseKristin Drinkard, LCSW-A 09/13/2014 8:45 AM  Signature:  Cleopatra Cedareggie King,  [facility] Team 09/13/2014 8:45 AM  Signature:  09/13/2014 8:45 AM  Signature:  09/13/2014  8:45 AM  Signature:    Onnie BoerJennifer Clark, RN [facility] 09/13/2014  8:45 AM  Signature:  Santa GeneraAnne Cunningham Lead Social Worker LCSW 09/13/2014  8:45 AM    Scribe for Treatment Team:   [place] Energy[employer]ylle Catheleen Langhorne,  09/13/2014 8:45 AM

## 2014-09-13 NOTE — [facility] Group Notes (Signed)
[facility] LCSW Group Therapy      Feelings About Diagnosis 1:15 - 2:30 PM          09/13/2014 3:27 PM  Type of Therapy:  Group Therapy  Participation Level:  Did Not Attend - patient in bed.  Wynn BankerHodnett, [place] Welcome Hairston 09/13/2014, 3:27 PM

## 2014-09-13 NOTE — Plan of Care (Signed)
Problem: Ineffective individual coping Goal: STG: Patient will remain free from self harm Outcome: Progressing Pt denies SI and is free of self harm     

## 2014-09-13 NOTE — Progress Notes (Signed)
Pt was found in his room lying on his right side on the ground.  Pt stated "I fell.  I have to use the bathroom.  I tried to move the table and I fell."  Earlier in shift, pt was using walker to ambulate.  Pt had no complaints of dizziness prior to fall.  Pt's only complaint prior to fall was chronic left shoulder pain of 8 out of 10 and PRN medication was administered.  Prior to fall, pt was instructed to use call bell prior to attempting to get up because pt's gait is unsteady.  Pt verbalized understanding, however pt did not use call bell prior to fall.  Pt reports he fell on his right side.  Pt has no complaints of pain or injury after fall.  Pt reports "I'm fine.  I fall all the time at home."  Pt refused to allow staff to notify a family member of fall.  Pt's vitals after fall were: blood pressure 115/62, pulse 75, respirations 18, O2: 95% lying down.  Pt's vitals while sitting were: blood pressure 107/85, pulse 84, respirations 16.  Pt was provided with yellow socks, pt has high fall risk wristband, high fall risk signage on door and on check sheet.  On call provider was notified of pt's fall and pt was placed on a 1:1 for safety.  Pt educated on fall prevention strategies and that he was now on a 1:1 for safety.  Pt was irritable about being placed on 1:1 initially, stating "I don't need someone here.  I can't go to sleep with somebody watching me.  I'm not going to fall.  I'll use the call bell if I need to get up."  Informed pt of importance of 1:1 monitoring for pt's safety and to prevent another fall, pt verbalized understanding.  Staff is currently monitoring pt 1:1 for safety.  Pt is in no distress at this time.  Will continue to monitor and assess for safety.

## 2014-09-13 NOTE — Evaluation (Signed)
Physical Therapy Evaluation Patient Details Name: Anthony [name]: [id number] DOB: 1961-10-10 Today's Date: 09/13/2014   History of Present Illness  pt admit due to feelign suicidal with hx of depression, substance abuse, PTSD and TBI.   Clinical Impression  Pt with increased weakness from baseline and pt stated he had recently just left a rehab facility [place] therapy in [place]. Pt has unsteady gait and with cues adjusts, to benefit from Pt to find appropriate AD for safety of pt and to help increase balance and awareness for improved mobility.     Follow Up Recommendations Home health PT    Equipment Recommendations  Rolling walker with 5" wheels    Recommendations for Other Services       Precautions / Restrictions Precautions Precautions: Fall      Mobility  Bed Mobility Overal bed mobility: Needs Assistance Bed Mobility: Supine to Sit     Supine to sit: Min assist     General bed mobility comments: pt had trouble getting supine to sit with his LES and upper body as well. also had a lot of difficulty scooting hips forward to prepare for sit to stand at EOB.   Transfers Overall transfer level: Needs assistance Equipment used: Rolling walker (2 wheeled) Transfers: Sit to/from Stand Sit to Stand: Min assist            Ambulation/Gait Ambulation/Gait assistance: Min assist Ambulation Distance (Feet): 50 Feet Assistive device: Rolling walker (2 wheeled) Gait Pattern/deviations: Step-through pattern     General Gait Details: synergistic pattern on R swing through phase with festinating gait at times . With cues to slow downa dn isolate hip flexion and knee flexion for foot clearnce pt was able to do so. Educated pt on how to be aware of this when gait speed gets to fast and balance off to slow and pick up RLE for foot clearance. he was aware of this.   Stairs            Wheelchair Mobility    Modified Rankin (Stroke Patients Only)       Balance                                              Pertinent Vitals/Pain Pain Assessment: No/denies pain    Home Living Family/patient expects to be discharged to:: Private residence Living Arrangements: Spouse/significant other Available Help at Discharge: Friend(s) Type of Home: House Home Access: Stairs to enter Entrance Stairs-Rails: Right (pt states he holds ontot he rail and doesn't have trouble with the steps) Entrance Stairs-Number of Steps: 4-5 Home Layout: One level Home Equipment: Cane - single point (unsure what other equipment he has)      Prior Function Level of Independence: Independent         Comments: really unclear, pt states he was doing fine that these new meds have him not very steady, but he refuses to use a WC . Nurse has seen him over the year and states that he does look a little more unsteady and weak this time.      Hand Dominance        Extremity/Trunk Assessment               Lower Extremity Assessment: Generalized weakness         Communication   Communication: No difficulties  Cognition Arousal/Alertness: Awake/alert Behavior  During Therapy: WFL for tasks assessed/performed Overall Cognitive Status: Within Functional Limits for tasks assessed                      General Comments      Exercises        Assessment/Plan    PT Assessment Patient needs continued PT services  PT Diagnosis Difficulty walking;Generalized weakness   PT Problem List Decreased strength;Decreased activity tolerance;Decreased mobility;Decreased balance;Decreased knowledge of use of DME  PT Treatment Interventions DME instruction;Gait training;Functional mobility training;Therapeutic exercise;Therapeutic activities;Neuromuscular re-education   PT Goals (Current goals can be found in the Care Plan section) Acute Rehab PT Goals Patient Stated Goal: I don't want to use a WC .Marland Kitchen. if I fall I will get back up.  PT Goal Formulation:  With patient Time For Goal Achievement: 09/27/14 Potential to Achieve Goals: Good    Frequency Min 2X/week   Barriers to discharge        Co-evaluation               End of Session   Activity Tolerance: Patient tolerated treatment well Patient left: Other (comment) [facility] Nurse Communication: Mobility status    Functional Assessment Tool Used: clinical judgement Functional Limitation: Mobility: Walking and moving around Mobility: Walking and Moving Around Current Status ([id number]([id number]): At least 1 percent but less than 20 percent impaired, limited or restricted Mobility: Walking and Moving Around Goal Status [phone]([id number]): At least 1 percent but less than 20 percent impaired, limited or restricted    Time: 1600-1630 PT Time Calculation (min): 30 min   Charges:   PT Evaluation $Initial PT Evaluation Tier I: 1 Procedure PT Treatments $Gait Training: 8-22 mins $Therapeutic Activity: 8-22 mins   PT G Codes:   Functional Assessment Tool Used: clinical judgement Functional Limitation: Mobility: Walking and moving around    [place]ITT, [facility] Hospital[facility]ARRON 09/13/2014, 7:00 PM Marella BileSharron Railyn House, PT Pager: [phone] 09/13/2014

## 2014-09-13 NOTE — Progress Notes (Addendum)
Anthony [name] stayed in his bed late  This morning....and then when he got up, he urinated a large amount of foul-smelling urine on himself and his bed. He is observed ambulating with a cane  , leaning to the far right as he walks and he assures this Clinical research associatewriter that he does not need help walking, that he is stable and that he will be safe and pay attention as he walks.    A He is compliant with his medications this morning, saying  " I'm ok....*I don't need anybody.. I  won't fall". He denies SI within the past 24 hrs.   R Pr remains safe. Addendum: Pt slept a long period of time this afternoon. He was difficult to arouse and it took about 5 minutes for him to acclamate, but he was able to do so and then he was able to practice ambulating using a walker ( he requested to do this and that's when Dr. Dub MikesLugo ordered PT consult. PT was able to observe Anthony [name] walk, using his walker and give nursing and pt input on how to continue safety with this patient. Pt was able to verbalize " I'm not going to fall...ibuprofen been leaning like this for a long time and I know myslef". Pt given pointers from PT as well as nursing and pt agreed to use walker when ambulating and HIGH FALL RISK documented.

## 2014-09-13 NOTE — ED Provider Notes (Signed)
Medical screening examination/treatment/procedure(s) were performed by non-physician practitioner and as supervising physician I was immediately available for consultation/collaboration.   EKG Interpretation   Date/Time:  Tuesday September 10 2014 22:25:51 EDT Ventricular Rate:  61 PR Interval:  135 QRS Duration: 77 QT Interval:  398 QTC Calculation: 401 R Axis:   54 Text Interpretation:  Sinus rhythm Low voltage, precordial leads  Borderline T abnormalities, inferior leads No old tracing to compare  Confirmed by Kairy Folsom  MD, Hilmer Aliberti ([id number]) on 09/10/2014 10:31:40 PM        Rolan BuccoMelanie Caragh Gasper, MD 09/13/14 [phone]
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
## 2014-09-14 DIAGNOSIS — F322 Major depressive disorder, single episode, severe without psychotic features: Secondary | ICD-10-CM

## 2014-09-14 LAB — VALPROIC ACID LEVEL: Valproic Acid Lvl: 47.3 ug/mL — ABNORMAL LOW (ref 50.0–100.0)

## 2014-09-14 NOTE — [facility] Group Notes (Signed)
[facility] Group Notes: (Clinical Social Work)   09/14/2014      Type of Therapy:  Group Therapy   Participation Level:  Did Not Attend - refused   Ambrose MantleMareida Grossman-Orr, LCSW 09/14/2014, 12:55 PM

## 2014-09-14 NOTE — Progress Notes (Signed)
Patient ID: Belle BingJeffrey Dunk, male   DOB: 04-26-1961, 53 y.o.   MRN: [id number]   D:Pt in the dayroom interacting with his peers, pt is  not in any distress..  Pt reported that he is looking forward to being off the 1:1, pt reported that he was walking much better.  Pt reported that soon he is going to start using his cane. Pt reported being negative SI/HI, no AH/VH noted. A: Pt on a 1:1 for patient safety and high fall risk. R: Pt safety maintained.

## 2014-09-14 NOTE — Progress Notes (Signed)
Patient did attend the evening speaker AA meeting.  

## 2014-09-14 NOTE — Progress Notes (Signed)
D: Pt is resting in his room with eyes closed. Respirations are even, unlabored, WNL. Pt is in no distress. A: 1:1 staff remains with pt for safety. R: Will continue to monitor and assess pt for safety. Pt remains on 1:1 per order.   

## 2014-09-14 NOTE — Progress Notes (Signed)
D:Pt in the dayroom interacting with his peers, pt reported that he was feeling much better. Pt reported that he felt like the only reason why he fail was because of his medications. Pt reported that he is looking forward to being off the 1:1, pt reported that he was walking much better. Pt was not in any distress. Pt reported being negative SI/HI, no AH/VH noted. A: Pt on a 1:1 for patient safety and high fall risk. R: Pt safety maintained.

## 2014-09-14 NOTE — Progress Notes (Signed)
D: Pt is resting in his room and is in no distress.   A: 1:1 staff remains with patient for high fall risk and for safety. R: Pt denies needs and concerns at this time.  Will continue to monitor pt 1:1 per order.

## 2014-09-14 NOTE — Progress Notes (Signed)
The patient attended the A. A. Meeting and was appropriate. Patient admits to having attended A.A. Meetings in the past but lost interest in the matter.
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
## 2014-09-14 NOTE — Progress Notes (Addendum)
Patient ID: Anthony [name], male   DOB: December 15, 1960, 53 y.o.   MRN: [id number]   D:Pt is resting in his room and is in no distress.  Pt reported being negative SI/HI, no AH/VH noted. A: Pt on a 1:1 for patient safety and high fall risk. R: Pt safety maintained.

## 2014-09-14 NOTE — Plan of Care (Signed)
Problem: Diagnosis: Increased Risk For Suicide Attempt Goal: STG-Patient Will Attend All Groups On The Unit Outcome: Progressing Pt attended evening group on 09/13/2014.  Problem: Alteration in mood Goal: LTG-Patient reports reduction in suicidal thoughts (Patient reports reduction in suicidal thoughts and is able to verbalize a safety plan for whenever patient is feeling suicidal)  Outcome: Progressing Pt denied SI this shift.

## 2014-09-14 NOTE — Progress Notes (Signed)
D: Pt is currently in dayroom talking with 1:1 staff.  Pt denies SI/HI, denies hallucinations, denies pain.  Pt reports "I've been getting around better today.  I'm ready to see PT tomorrow so I can get this pink band off of me."   A: Reminded pt of fall prevention techniques.  Provided encouragement and support.  Notified pt that his night medication would be brought to his room for him when he was ready to go to bed.  Safety maintained with 1:1 staff. R: Pt states "okay, that'll be good" in regard to his night medication.    Pt verbally contracts for safety and is in no distress.  Will continue to monitor pt with 1:1 staff for high fall risk and safety.

## 2014-09-14 NOTE — Progress Notes (Signed)
D:  Pt has depressed affect, anxious mood. Pt denies SI, denies hallucinations.  Pt reports "I stayed in the bed most of the day.  I didn't make it to all of the groups like I wanted to."  Pt interacts with peers and staff appropriately.  Attended evening group. Pt's gait is unsteady and pt uses a walker.  Pt is a high fall risk. A: Medications administered per order.  Pt received PRN medication for anxiety, pain, and insomnia, see flowsheet.  Met with pt 1:1 and provided support and encouragement.  Pt was educated on fall prevention techniques, verbalized understanding.  Placed on 1:1 for high fall risk and for safety.   R: Pt is compliant with medications.  Pt verbally contracts for safety and is in no distress.  1:1 sitter is at bedside for safety.  Pt is resting in room with eyes closed.  Respirations are even, unlabored, WNL.  Will continue to monitor and assess for safety.

## 2014-09-15 MED ORDER — MIRTAZAPINE 7.5 MG PO TABS
7.5000 mg | ORAL_TABLET | Freq: Every day | ORAL | Status: DC
  Administered 2014-09-15: 7.5 mg via ORAL
  Filled 2014-09-15: qty 3
  Filled 2014-09-15 (×3): qty 1
  Filled 2014-09-15: qty 3
  Filled 2014-09-15: qty 1

## 2014-09-15 NOTE — Progress Notes (Signed)
Patient did attend the evening speaker AA meeting.  

## 2014-09-15 NOTE — Progress Notes (Signed)
Psychoeducational Group Note  Date:  09/15/2014 Time:  1015  Group Topic/Focus:  Making Healthy Choices:   The focus of this group is to help patients identify negative/unhealthy choices they were using prior to admission and identify positive/healthier coping strategies to replace them upon discharge.  Participation Level:  Did not attend Anthony [name], Anthony [name] 09/15/2014

## 2014-09-15 NOTE — Plan of Care (Signed)
Problem: Diagnosis: Increased Risk For Suicide Attempt Goal: LTG-Patient Will Report Improved Mood and Deny Suicidal LTG (by discharge) Patient will report improved mood and deny suicidal ideation.  Outcome: Progressing Pt denied SI this shift.
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
## 2014-09-15 NOTE — Progress Notes (Addendum)
Anthony [name] remains quiet and asleep. HE sleeps very quietly , remains quite instable when he gets up and continues to be unhappy about having a 1:1 sitter.   A HE says " just let me go back to sleep" and he gets back into the bed...after  Using the bathroom.   R Safety in place.

## 2014-09-15 NOTE — [facility] Group Notes (Signed)
[facility] Group Notes:  (Nursing/MHT/Case Management/Adjunct)  Date:  09/15/2014  Time:  2:03 PM  Type of Therapy:  Psychoeducational Skills- Pt Self Inventory Group  Participation Level:  Did Not Attend  Ada Holness Shanta 09/15/2014, 2:03 PM 

## 2014-09-15 NOTE — Plan of Care (Signed)
Problem: Diagnosis: Increased Risk For Suicide Attempt Goal: STG-Patient Will Report Suicidal Feelings to Staff Outcome: Progressing Pt denied SI this shift.  Pt agreed to notify staff if he thinks of harming himself.

## 2014-09-15 NOTE — Progress Notes (Signed)
1:1 1834 Anthony [name] is seen sitting in the dayroom, watching TV with the other patients. When he stops this nurse , to speak with me, he is observed drooling as he talks...with saliva dribbling out of the right side of his lips.  A HE does not complete his morning assessment and says " I don't want to do it" when prompted by this Clinical research associatewriter. He remaisn unsteady on his feet.HE refuses to use wheelchair and to allow staff to help him as he ambulates. R Safety is in palc enad poc cont.

## 2014-09-15 NOTE — Progress Notes (Signed)
D: Pt is resting in his room with eyes closed. Respirations are even, unlabored, WNL. Pt is in no distress. A: 1:1 staff remains with pt for safety. R: Will continue to monitor and assess pt for safety. Pt remains on 1:1 per order.

## 2014-09-15 NOTE — Progress Notes (Signed)
D: Pt has flat affect, depressed mood.  Pt denies SI/HI, denies hallucinations.  Pt is in dayroom interacting with staff and peer.  Pt reports "I was walking around a lot today.  I had someone with me, but I was getting around okay."  Pt attended evening group.   A: Informed pt that scheduled medications at bedtime would be brought to his room.  Provided support and encouragement.  Safety maintained with 1:1 staff. R: Pt verbally contracts for safety and is in no distress.  Will continue to monitor pt on 1:1 for high fall risk and for safety per order.

## 2014-09-15 NOTE — Progress Notes (Signed)
Patient ID: Anthony [name], male   DOB: Apr 30, 1961, 53 y.o.   MR[facility]: [id number] [facility]  09/15/2014 1:50 PM Anthony [name]  MRN:  [id number] Subjective:   Patient states "I am still very tired. I am still depressed and anxious. I talked to my fiance. I told her to stop calling me names. I don't want to end up in jail."   Objective:  Patient assessed in his room this morning. He continues to appear depressed and his hygiene is very poor. The patient was encouraged to shower and attend groups. Nursing staff report that he continues to sleep during the day and only gets up to use the bathroom. His gait remains unstable and he is a high fall risk. Anthony [name] was encouraged by writer to get up so as to not disrupt his sleep tonight. He continues to justify staying in bed by reporting poor sleep. Patient reminded that staying in bed all day was only worsening the problem. He compliant with medications and denies adverse effects. The patient appears to have low energy and no motivation.   Diagnosis:   DSM5: Depressive Disorders:  Major Depressive Disorder - Severe (296.23) Total Time spent with patient: 20 minutes  Axis I: Major Depression, Recurrent severe and Post Traumatic Stress Disorder  ADL's:  Impaired, Affected by his physical limitations  Sleep: Fair  Appetite:  Fair  Psychiatric Specialty Exam: Physical Exam  Review of Systems  Constitutional: Positive for malaise/fatigue.  HENT: Negative.   Eyes: Negative.   Respiratory: Negative.   Cardiovascular: Negative.   Gastrointestinal: Negative.   Genitourinary: Negative.   Musculoskeletal: Positive for back pain.  Skin: Negative.   Neurological: Positive for weakness.  Endo/Heme/Allergies: Negative.   Psychiatric/Behavioral: Positive for depression. The patient has insomnia.     Blood pressure 98/78, pulse 81, temperature 97.5 F (36.4 C), temperature source Oral, resp. rate 18, SpO2 98.00%.There is no height or weight on  file to calculate BMI.  General Appearance: Fairly Groomed  Patent attorneyye Contact::  Fair  Speech:  Slurred  Volume:  Decreased  Mood:  Anxious and worried  Affect:  anxious worried  Thought Process:  Coherent and Goal Directed  Orientation:  Full (Time, Place, and Person)  Thought Content:  symtpoms events worries concerns  Suicidal Thoughts:  No  Homicidal Thoughts:  No  Memory:  Immediate;   Fair Recent;   Fair Remote;   Fair  Judgement:  Fair  Insight:  Present and Shallow  Psychomotor Activity:  Normal  Concentration:  Fair  Recall:  [employer]Fair  Fund of Knowledge:Fair  Language: Fair  Akathisia:  No  Handed:    AIMS (if indicated):     Assets:  Desire for Improvement Housing Social Support  Sleep:  Number of Hours: 2.75   Musculoskeletal: Strength & Muscle Tone: abnormal and decreased Gait & Station: affected by his physical limitations Patient leans: N/A  Current Medications: Current Facility-Administered Medications  Medication Dose Route Frequency Provider Last Rate Last Dose  . acetaminophen (TYLENOL) tablet 650 mg  650 mg Oral Q6H PRN Kerry HoughSpencer E Simon, PA-C   650 mg at 09/15/14 0055  . alum & mag hydroxide-simeth (MAALOX/MYLANTA) 200-200-20 MG/5ML suspension 30 mL  30 mL Oral Q4H PRN Kerry HoughSpencer E Simon, PA-C   30 mL at 09/13/14 0123  . divalproex (DEPAKOTE ER) 24 hr tablet 1,500 mg  1,500 mg Oral QPM Rachael FeeIrving A Lugo, MD   1,500 mg at 09/14/14 1609  . hydrOXYzine (ATARAX/VISTARIL) tablet 25 mg  25 mg  Oral Q6H PRN Kerry HoughSpencer E Simon, PA-C   25 mg at 09/15/14 0055  . ibuprofen (ADVIL,MOTRIN) tablet 800 mg  800 mg Oral BID Kerry HoughSpencer E Simon, PA-C   800 mg at 09/15/14 1251  . magnesium hydroxide (MILK OF MAGNESIA) suspension 30 mL  30 mL Oral Daily PRN Kerry HoughSpencer E Simon, PA-C      . nicotine (NICODERM CQ - dosed in mg/24 hours) patch 21 mg  21 mg Transdermal Daily Rachael FeeIrving A Lugo, MD   21 mg at 09/15/14 1251  . traZODone (DESYREL) tablet 50 mg  50 mg Oral QHS,MR X 1 Kerry HoughSpencer E Simon, PA-C   50 mg  at 09/15/14 16100004    Lab Results:  Results for orders placed during the hospital encounter of 09/11/14 (from the past 48 hour(s))  VALPROIC ACID LEVEL     Status: Abnormal   Collection Time    09/14/14  7:40 PM      Result Value Ref Range   Valproic Acid Lvl 47.3 (*) 50.0 - 100.0 ug/mL   Comment[facility]    Physical Findings: AIMS: Facial and Oral Movements Muscles of Facial Expression: None, normal Lips and Perioral Area: None, normal Jaw: None, normal Tongue: None, normal,Extremity Movements Upper (arms, wrists, hands, fingers): None, normal Lower (legs, knees, ankles, toes): None, normal, Trunk Movements Neck, shoulders, hips: None, normal, Overall Severity Severity of abnormal movements (highest score from questions above): None, normal Incapacitation due to abnormal movements: None, normal Patient's awareness of abnormal movements (rate only patient's report): No Awareness, Dental Status Current problems with teeth and/or dentures?: No Does patient usually wear dentures?: No  CIWA:  CIWA-Ar Total: 0 COWS:     Treatment Plan Summary: Daily contact with patient to assess and evaluate symptoms and progress in treatment Medication management  Plan:  1. Continue crisis management and stabilization.  2. Medication management:  -Continue Depakote 1,500 mg every evening for improved mood stability. Depakote level currently is 47.3 which is sub therapeutic and can be increased the dose if needed.  -Start Remeron 7.5 mg hs to help with depression and insomnia.  3. Encouraged patient to attend groups and participate in group counseling sessions and activities.  4. Discharge plan in progress.  5. Continue current treatment plan.  6. Address health issues: Vitals reviewed and stable. Continue 1:1 observation for fall prevention. Physical therapy consult has been ordered.   Medical Decision Making Problem Points:  Established problem, worsening (2) and  Review of psycho-social stressors (1) Data Points:  Review of medication regiment & side effects (2) Review of new medications or change in dosage (2)  I certify that inpatient services furnished can reasonably be expected to improve the patient's condition.   DAVIS, LAURA NP-C 09/15/2014, 1:50 PM  Reviewed the information documented and agree with the treatment plan.  Bettie Swavely,JANARDHAHA R. 09/15/2014 2:24 PM

## 2014-09-15 NOTE — Progress Notes (Signed)
Anthony [name] remains asleep at the present. He got OOB mid=morning and requested his morning medication, which he took without diff.   A He remains a very high fall risk , tottering back and forth as he ambulates down the hall  With his MHT sitter at his side. He contracts for safety. He remains intrusive.compulsive and has limited short term memory. 1:1 order renewed by practitioner for pt safety.   R Safety in place.

## 2014-09-15 NOTE — Progress Notes (Signed)
D: Pt is resting in room and is in no distress.  Respirations are even and unlabored.  Pt appears to be asleep. A: 1:1 staff remains with pt for high fall risk and for safety.  R: Will continue to monitor and assess pt.  Remains on 1:1 per order.

## 2014-09-16 MED ORDER — QUETIAPINE FUMARATE 50 MG PO TABS
50.0000 mg | ORAL_TABLET | Freq: Every evening | ORAL | Status: DC | PRN
  Administered 2014-09-16: 50 mg via ORAL
  Filled 2014-09-16: qty 3
  Filled 2014-09-16: qty 1

## 2014-09-16 NOTE — Progress Notes (Addendum)
0745  Patient sitting in bed eating breakfast with 1:1 present per MD order for safety.  Patient stated he slept well last night.  Denied SI and HI.  Denied A/V hallucinations.  Denied pain.  Ate 100% of breakfast.  Patient smiling and talking.  No signs/symptoms of pain/distress noted on patient's face or body movements.  Respirations even and unlabored.  1:1 continues for safety.  0830  Patient stated his main goal today is to work on discharge.  Plans to go to more groups today.  1:1 continues for safety.   Safety maintained.    0920  Patient's self inventory sheet, patient slept good last night, sleep medication is helpful.  Good appetite, normal energy level, poor concentration.  Rated depression and hopeless #6, rated anxiety #8.  Denied withdrawals.  Denied SI.  Physical problems, shoulder pain, worst pain #7, pain meds are helpful.  Goal is to get out of hospital, talk to MD.  No discharge plans.  No problems following discharge.  1:1 continues for safety.  Safety maintained.  1130  Patient woke up from nap.  1:1 present for safety.  Patient denied SI and HI.  Denied A/V hallucinations.  Denied pain.  Respirations even and unlabored.  No signs/symptoms of pain/distress noted on patient's face/body movements.  1:1 continues per MD order for safety.  1430  Patient in group this afternoon.  Patient went to dining room for lunch.  1:1 continues for safety.   Safety maintained.  Patient talked with MD after lunch.  Respirations even and unlabored.  No signs/symptoms of pain/distress noted on patient's face/body movements.  Patient ambulating with cane.    1500  Patient continues to be in group.  Patient denied SI and HI.  Denied A/V hallucinations.  Stated he will be discharged tomorrow and is looking forward to his discharge.  Patient continues to ambulate with cane and 1:1 present for safety.  Respirations even and unlabored.  No signs/symptoms of pain/distress noted on patient's face or body  movements.    1730  Patient sitting in dayroom with 1:1 present, talking to peer.  MHT stated patient had been disrespectful to her.  Patient stated he wanted to keep MHT with him at this time and that he would be nicer to her.  Charge nurse informed.  Patient given vistaril for anxiety, stated he wanted stronger anxiety medication.  Patient informed that MD had written order for seroquel tonight.  1:1 continues for safety.  Respirations even and unlabored.  No signs/symptoms of pain/distress noted on patient's face/body movements.    1845  Patient went to dining room for dinner, ambulating with cane.  Stated he ate good dinner.  1:1 present for safety.  Patient denied SI and HI.  Denied A/V hallucination.  Looking forward to discharge tomorrow.  MD aware that patient wants stronger anxiety medication.  Patient sitting in dayroom looking at ball game.  Patient stated he is feeling good.  Respirations even and unlabored.  No signs/symptoms of pain/distress noted on patient's face/body movements.  Safety maintained with 1:1 present.

## 2014-09-16 NOTE — Progress Notes (Addendum)
1:1 Note  D:  Patient in the bathroom on approach. Patient is unsteady on her feel and on 1:1.  Sitter is using the gait belt but patient does not want assistance.  Patient states he had a good day.  Patient states he is supposed to discharge tomorrow.  Patient denies SI/HI and denies AVH.  Patient has had some periods of irritability with sitters but none witness by Clinical research associatewriter.  Patient sitting in the dayroom watching football quietly. A: Staff to monitor Q 15 mins for safety.  Encouragement and support offered.  Scheduled medications administered per orders. R: Patient remains safe on the unit.  Patient attended group tonight.    Patient visible on the unit.  Patient is flirtatious with staff.

## 2014-09-16 NOTE — Progress Notes (Signed)
[facility] Group Notes:  (Nursing/MHT/Case Management/Adjunct)  Date:  09/16/2014  Time:  2:54 PM  Type of Therapy:  Therapeutic Activity  Participation Level:  Active  Participation Quality:  Appropriate  Affect:  Appropriate  Cognitive:  Appropriate  Insight:  Appropriate  Engagement in Group:  Engaged  Modes of Intervention:  Activity  Summary of Progress/Problems: Pts played "Leisure ABC's." Pts identified positive, healthy activities that correspond with each letter of the alphabet.  Anthony [name] C 09/16/2014, 2:54 PM 

## 2014-09-16 NOTE — Progress Notes (Addendum)
D: Pt is in no distress and is resting in room with eyes closed.  Respirations are even and unlabored. A: 1:1 staff remains with patient for high fall risk and for safety.   R: Will continue to monitor pt 1:1 per order.

## 2014-09-16 NOTE — Progress Notes (Addendum)
1:1 Note D: Patient resting in bed with eyes closed.  Respirations even and unlabored.  Patient appears to be in no apparent distress. A: Staff to monitor Q 15 mins for safety.  Patient remains on 1:1 for safety. R:Patient remains safe on the unit.  

## 2014-09-16 NOTE — [facility] Group Notes (Signed)
[facility]  09/16/2014  8:45 AM  Participation Quality: Did Not Attend- patient in bed.  Anthony BruinKristin Jazon Jipson, MSW, [employer]LCSWA Clinical Social Worker [facility] [phone]

## 2014-09-16 NOTE — Progress Notes (Signed)
Adult Psychoeducational Group Note  Date:  09/16/2014 Time:  10:00AM  Group Topic/Focus:  Dimensions of Wellness:   The focus of this group is to introduce the topic of wellness and discuss the role each dimension of wellness plays in total health.  Participation Level:  Did Not Attend  Additional Comments:  Pt did not attend group therapy. Pt was in his room, in the bed asleep at this time  [name] K 09/16/2014, 10:27 AM

## 2014-09-16 NOTE — [facility] Group Notes (Signed)
[facility] LCSW Group Therapy 09/16/2014  1:15 pm  Type of Therapy: Group Therapy Participation Level: Active  Participation Quality: Attentive and Supportive  Affect: Depressed and Flat  Cognitive: Alert and Oriented  Insight: Developing/Improving and Engaged  Engagement in Therapy: Developing/Improving and Engaged  Modes of Intervention: Clarification, Confrontation, Discussion, Education, Exploration,  Limit-setting, Orientation, Problem-solving, Rapport Building, Dance movement psychotherapisteality Testing, Socialization and Support  Summary of Progress/Problems: Pt identified obstacles faced currently and processed barriers involved in overcoming these obstacles. Pt identified steps necessary for overcoming these obstacles and explored motivation (internal and external) for facing these difficulties head on. Pt further identified one area of concern in their lives and chose a goal to focus on for today. Patient actively listened during group but did not want to share on topic today.  Samuella BruinKristin Maxima Skelton, MSW, [employer]LCSWA Clinical Social Worker [facility] [phone]

## 2014-09-16 NOTE — Progress Notes (Signed)
Patient ID: Anthony [name], male   DOB: 03-Jun-1961, 53 y.o.   MRN: [id number] PER STATE REGULATIONS 482.30  THIS CHART WAS REVIEWED FOR MEDICAL NECESSITY WITH RESPECT TO THE PATIENT'S ADMISSION/ DURATION OF STAY.  NEXT REVIEW DATE: 09/18/2014  Anthony RoughJENNIFER JONES Keaton Stirewalt, RN, BSN CASE MANAGER

## 2014-09-16 NOTE — Progress Notes (Signed)
D: Pt has woken up multiple times, but is able to go back to sleep without difficulty.  Pt is currently resting in his room and is in no distress. Respirations are even and unlabored. A: 1:1 staff remains with pt for high fall risk and for safety. R: Will continue to monitor pt 1:1 per order.
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
## 2014-09-16 NOTE — Progress Notes (Signed)
Physical Therapy Treatment Patient Details Name: Anthony [name] MRN: [id number] DOB: Apr 06, 1961 Today's Date: 09/16/2014    History of Present Illness Pt is a 53 year old male [facility] for feeling suicidal and depression with hx of depression, substance abuse, PTSD, TBI    PT Comments    Pt assisted with ambulation using SPC today and performed LE exercises.  Pt making good progress.  Follow Up Recommendations  Home health PT     Equipment Recommendations  Rolling walker with 5" wheels    Recommendations for Other Services       Precautions / Restrictions Precautions Precautions: Fall    Mobility  Bed Mobility               General bed mobility comments: pt up in bathroom on entering room  Transfers Overall transfer level: Needs assistance Equipment used: Rolling walker (2 wheeled) Transfers: Sit to/from Stand Sit to Stand: Min assist         General transfer comment: sit to stand from bed with min assist to rise  Ambulation/Gait Ambulation/Gait assistance: Min guard;Min assist Ambulation Distance (Feet): 350 Feet Assistive device: Rolling walker (2 wheeled);Straight cane Gait Pattern/deviations: Step-through pattern;Decreased dorsiflexion - left;Decreased dorsiflexion - right     General Gait Details: synergistic pattern on R swing through phase with festinating gait at times still observed this visit. continues to need cues for improving hip flexion and knee flexion for foot clearnce with improvement in clearance observed just after cues however short duration. pt used RW for 150 feet and then [facility]     Stairs            Wheelchair Mobility    Modified Rankin (Stroke Patients Only)       Balance Overall balance assessment: Needs assistance                             High Level Balance Comments: pt performed eyes closed x2, only able to hold 10 sec, performed SLS x2 each LE with SPC approx 30 sec each    Cognition  Arousal/Alertness: Awake/alert Behavior During Therapy: WFL for tasks assessed/performed Overall Cognitive Status: Within Functional Limits for tasks assessed                      Exercises Total Joint Exercises Hip ABduction/ADduction: AROM;Both;10 reps;Limitations;Standing Hip Abduction/Adduction Limitations: decreased range on L LE Long Arc Quad: AROM;Both;10 reps;Seated Marching in Standing: AROM;Seated;Both;10 reps General Exercises - Lower Extremity Toe Raises: AROM;Standing;Both;15 reps Heel Raises: AROM;Standing;Both;15 reps Mini-Sqauts: AROM;Strengthening;Both;15 reps    General Comments        Pertinent Vitals/Pain Pain Assessment: No/denies pain    Home Living                      Prior Function            PT Goals (current goals can now be found in the care plan section) Progress towards PT goals: Progressing toward goals    Frequency  Min 2X/week    PT Plan Current plan remains appropriate    Co-evaluation             End of Session Equipment Utilized During Treatment: Gait belt Activity Tolerance: Patient tolerated treatment well Patient left:  (with nsg tech 1:1 for lunch)     Time: 4098-11911200-1223 PT Time Calculation (min): 23 min  Charges:  $Gait Training: 8-22 mins $Therapeutic  Exercise: 8-22 mins                    G Codes:      Anthony [name],Anthony [name] 09/16/2014, 2:14 PM Anthony JarredKati Venna [name], PT, DPT 09/16/2014 Pager: [phone]

## 2014-09-17 DIAGNOSIS — F332 Major depressive disorder, recurrent severe without psychotic features: Principal | ICD-10-CM

## 2014-09-17 MED ORDER — TRAZODONE HCL 50 MG PO TABS: 50.0000 mg | ORAL_TABLET | Freq: Every evening | ORAL | Status: DC | PRN

## 2014-09-17 MED ORDER — MIRTAZAPINE 7.5 MG PO TABS: 7.5000 mg | ORAL_TABLET | Freq: Every day | ORAL | Status: DC

## 2014-09-17 MED ORDER — IBUPROFEN 800 MG PO TABS: 800.0000 mg | ORAL_TABLET | Freq: Two times a day (BID) | ORAL | Status: DC

## 2014-09-17 MED ORDER — QUETIAPINE FUMARATE 50 MG PO TABS: 50.0000 mg | ORAL_TABLET | Freq: Every evening | ORAL | Status: DC | PRN

## 2014-09-17 MED ORDER — DIVALPROEX SODIUM ER 500 MG PO TB24
1500.0000 mg | ORAL_TABLET | Freq: Every evening | ORAL | Status: DC
Start: 2014-09-17 — End: 2016-10-20
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
## 2014-09-17 NOTE — Progress Notes (Addendum)
0830  Patient has 1:1 for safety.  Patient denied SI and HI.  Denied A/V hallucinations.  Denied pain.  Stated he is ready to go home today.  Ate 100% breakfast.  Respirations even and unlabored.  No signs/symptoms of pain/distreses noted on patient's face or body movements.  1:1 continues for safety per MD order.  1120  Patient attended group this morning with 1:1 present.  Patient participated in group.  Looking forward to discharge today.  Denied SI and HI.  Denied A/V hallucinations.  Denied pain.  Patient sitting in dayroom talking to staff and peers.  Safety maintained with 1:1 per MD orders.  Respirations even and unlabored.  No signs/symptoms of pain/distress noted on patient's face/body movements.  1230  Patient went to dining room for lunch with 1:1 for safety.  Patient anxious for discharge.  Respirations even and unlabored.  No signs/symptoms of pain/distress noted on patient's face/body movements.   Patient's self inventory sheet, patient had poor sleep, sleep medication is not helpful.  Good appetite, normal energy level, good concentration.  Rated depression 5, hopeless and anxiety 6.  Denied withdrawals.  SI sometimes, contracts for safety.  Sometimes feels lightheaded.  Physical pain in past 24 hours, worst pain 8, shoulders and head.  Goal today is balance, use walker.  Needs better sleep medicine.  No discharge plans.  No problems with follow up after discharge.

## 2014-09-17 NOTE — Progress Notes (Signed)
Recreation Therapy Notes  Animal-Assisted Activity/Therapy (AAA/T) Program Checklist/Progress Notes Patient Eligibility Criteria Checklist & Daily Group note for Rec Tx Intervention  Date: 10.20.2015 Time: 2:45pm Location: [address] Dayroom   AAA/T Program Assumption of Risk Form signed by Patient/ or Parent Legal Guardian yes  Patient is free of allergies or sever asthma yes  Patient reports no fear of animals yes  Patient reports no history of cruelty to animals yes   Patient understands his/her participation is voluntary yes  Patient washes hands before animal contact yes  Patient washes hands after animal contact yes  Behavioral Response: Appropriate   Education: Hand Washing, Appropriate Animal Interaction   Education Outcome: Acknowledges education.   Clinical Observations/Feedback: Patient interacted appropriately with therapy dog, petting him appropriately. Patient additionally shared information about his pet with group and asked appropriate questions about therapy dog and his training.   Marykay Lexenise L Roshaunda Starkey, LRT/CTRS  Korey Prashad L 09/17/2014 4:11 PM

## 2014-09-17 NOTE — [facility] Group Notes (Signed)
The focus of this group is to educate the patient on the purpose and policies of crisis stabilization and provide a format to answer questions about their admission.  The group details unit policies and expectations of patients while admitted.  Patient attended 0900 nurse education orientation group this morning.  Patient actively participated, appropriate affect, alert, appropriate insight and engagement.  Today patient will work on 3 goals for discharge.  

## 2014-09-17 NOTE — [facility] Group Notes (Signed)
[facility] LCSW Group Therapy 09/17/2014  1:15 PM   Type of Therapy: Group Therapy  Participation Level: Did Not Attend.   Samuella BruinKristin Judah Chevere, MSW, [employer] Clinical Social Worker [facility] [phone]

## 2014-09-17 NOTE — Progress Notes (Signed)
[facility] Discharge [facility] :  Will you be returning to the same living situation after discharge: Yes,  patient will return home At discharge, do you have transportation home?:Yes,  patient reports that he can have someone pick him up Do you have the ability to pay for your medications:Yes,  patient will be provided with prescriptions at discharge and verbalizes his ability to obtain his medications.  Release of information consent forms completed and in the chart;  Patient's signature needed at discharge.  Patient to Follow up at: Follow-up Information   Follow up with [facility] On 09/20/2014. (Appointment on Friday September 20, 2014 at 1 pm for assessment for therapy services. Please bring your I.D., social security card, and insurance card. Please call office if you need to reschedule appointment.)    Contact information:   Phone: [phone] Fax: [phone] Address: [address], [place], [place] [address]      [facility] On 09/[facility]/2014. (Please present to medication management appointment with Dr. Ethelene Halamos on Wednesday Oct. 28th at 10:45. Please bring your medications with you. Call office if you need to reschedule.)    Contact information:   Address: [address], [place], [place] [address] [phone]      Patient denies SI/HI:   Yes,  denies    Safety Planning and Suicide Prevention discussed:  Yes,  with patient and girlfriend  Anthony [name], [place] CarboKristin [name] 09/17/2014, 10:42 AM

## 2014-09-17 NOTE — [facility] Suicide Risk Assessment (Signed)
Suicide Risk Assessment  Discharge Assessment     Demographic Factors:  Caucasian  Total Time spent with patient: 30 minutes  Psychiatric Specialty Exam:     Blood pressure 94/66, pulse 81, temperature 97.6 F (36.4 C), temperature source Oral, resp. rate 16, SpO2 98.00%.There is no height or weight on file to calculate BMI.  General Appearance: Fairly Groomed  Patent attorneyye Contact::  Fair  Speech:  Clear and Coherent  Volume:  Normal  Mood:  Anxious  Affect:  Appropriate  Thought Process:  Coherent and Goal Directed  Orientation:  Full (Time, Place, and Person)  Thought Content:  plans as he he moves on, what he needs to do to do well at home  Suicidal Thoughts:  No  Homicidal Thoughts:  No  Memory:  Immediate;   Fair Recent;   Fair Remote;   Fair  Judgement:  Fair  Insight:  Present and Shallow  Psychomotor Activity:  Restlessness  Concentration:  Fair  Recall:  [employer]  Fund of Knowledge:NA  Language: Fair  Akathisia:  No  Handed:    AIMS (if indicated):     Assets:  Desire for Improvement Housing  Sleep:  Number of Hours: 4.75    Musculoskeletal: Strength & Muscle Tone: spastic and decreased Gait & Station: unsteady Patient leans: N/A   Mental Status Per Nursing Assessment::   On Admission:  NA  Current Mental Status by Physician: In full contact with reality. There are no active SI plans or intent. He states that he now realizes he needs to go back to taking the Depakote every day. He wants to be sure he does not go off on his wife again. [facility] and [facility] medical doctor and plans to ask them to help with medications for anxiety (specifically wants Klonopin)    Loss Factors: Decline in physical health  Historical Factors: NA  Risk Reduction Factors:   Sense of responsibility to family and Living with another person, especially a relative  Continued Clinical Symptoms:  Bipolar Disorder:   Depressive phase Depression:    Aggression Impulsivity  Cognitive Features That Contribute To Risk:  Closed-mindedness Polarized thinking Thought constriction (tunnel vision)    Suicide Risk:  Minimal: No identifiable suicidal ideation.  Patients presenting with no risk factors but with morbid ruminations; may be classified as minimal risk based on the severity of the depressive symptoms  Discharge Diagnoses:   AXIS I:  PTSD, Major Depression recurrent severe, Bipolar Depression( R/O) AXIS II:  No diagnosis AXIS III:   Past Medical History  Diagnosis Date  . Depression   . Post traumatic stress disorder   . Bell's palsy   . Seizures     last seizure 12 yrs ago   AXIS IV:  other psychosocial or environmental problems AXIS V:  61-70 mild symptoms  Plan Of Care/Follow-up recommendations:  Activity:  as tolerated Diet:  regular [place] [facility] Is patient on multiple antipsychotic therapies at discharge:  No   Has Patient had three or more failed trials of antipsychotic monotherapy by history:  No  Recommended Plan for Multiple Antipsychotic Therapies: NA    Shirla Hodgkiss A 09/17/2014, 12:17 PM

## 2014-09-17 NOTE — Progress Notes (Signed)
D: Patient resting in bed with eyes closed.  Respirations even and unlabored.  Patient appears to be in no apparent distress. A: Staff to monitor Q 15 mins for safety.  Patient remains on 1:1 for fall risk safety R:Patient remains safe on the unit.  

## 2014-09-17 NOTE — Progress Notes (Addendum)
Discharge Note:  Patient discharged home with girlfriend.  Denied SI and HI.  Denied A/V hallucinations.  Suicide prevention information given and discussed with patient who stated he understood and had no questions.  Patient stated he received all his belongings, clothing, toiletries, misc items, prescriptions, medications.  Patient stated he appreciated all [facility].  When patient was being discharged, girlfriend and another friend came back on unit after asking security guard to go to the bathroom.  When nurse took patient to lobby after discharge, patient's girlfriend and friend started asking nurse about patient's status.  Girlfriend very close to nurse when hands on her hips, demanding to know information about patient.  Nurse informed charge nurse.
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
## 2014-09-20 NOTE — Progress Notes (Signed)
Patient Discharge Instructions:  After Visit Summary (AVS):   Faxed to:  09/20/14 Discharge Summary Note:   Faxed to:  09/20/14 Psychiatric Admission Assessment Note:   Faxed to:  09/20/14 Suicide Risk Assessment - Discharge Assessment:   Faxed to:  09/20/14 Faxed/Sent to the Next Level Care provider:  09/20/14 Faxed to [facility] @ [phone] Faxed to [facility] @ [phone]  Jerelene ReddenSheena E [place], 09/20/2014, 1:28 PM

## 2016-10-19 ENCOUNTER — Encounter (HOSPITAL_COMMUNITY): Payer: Self-pay | Admitting: Emergency Medicine

## 2016-10-19 ENCOUNTER — Emergency Department (HOSPITAL_COMMUNITY)
Admission: EM | Admit: 2016-10-19 | Discharge: 2016-10-19 | Disposition: A | Discharge status: Home / Self Care | Payer: Medicare HMO | Attending: Emergency Medicine | Admitting: Emergency Medicine

## 2016-10-19 DIAGNOSIS — R21 Rash and other nonspecific skin eruption: Secondary | ICD-10-CM | POA: Insufficient documentation

## 2016-10-19 DIAGNOSIS — Z79899 Other long term (current) drug therapy: Secondary | ICD-10-CM | POA: Diagnosis not present

## 2016-10-19 DIAGNOSIS — F17210 Nicotine dependence, cigarettes, uncomplicated: Secondary | ICD-10-CM | POA: Insufficient documentation

## 2016-10-19 MED ORDER — FAMOTIDINE 20 MG PO TABS
20.0000 mg | ORAL_TABLET | Freq: Once | ORAL | Status: AC
  Administered 2016-10-19: 20 mg via ORAL
  Filled 2016-10-19: qty 1

## 2016-10-19 MED ORDER — DIPHENHYDRAMINE HCL 25 MG PO CAPS: 25.0000 mg | ORAL_CAPSULE | Freq: Four times a day (QID) | ORAL | 0 refills | Status: DC | PRN

## 2016-10-19 MED ORDER — HYDROCORTISONE 1 % EX CREA: TOPICAL_CREAM | CUTANEOUS | 0 refills | Status: DC

## 2016-10-19 MED ORDER — DIPHENHYDRAMINE HCL 25 MG PO CAPS
25.0000 mg | ORAL_CAPSULE | Freq: Once | ORAL | Status: AC
  Administered 2016-10-19: 25 mg via ORAL
  Filled 2016-10-19: qty 1

## 2016-10-19 MED ORDER — DEXAMETHASONE SODIUM PHOSPHATE 10 MG/ML IJ SOLN
10.0000 mg | Freq: Once | INTRAMUSCULAR | Status: AC
  Administered 2016-10-19: 10 mg via INTRAMUSCULAR
  Filled 2016-10-19: qty 1

## 2016-10-19 MED ORDER — FAMOTIDINE 20 MG PO TABS: 20.0000 mg | ORAL_TABLET | Freq: Two times a day (BID) | ORAL | 0 refills | Status: DC

## 2016-10-19 MED ORDER — PREDNISONE 20 MG PO TABS: ORAL_TABLET | ORAL | 0 refills | Status: DC

## 2016-10-19 NOTE — ED Triage Notes (Signed)
Per pt, states he broke out in rash around 0630 this am-on trunk, upper legs and arms-warm to touch-doesn't think he ate anything different, nothing new

## 2016-10-19 NOTE — ED Provider Notes (Signed)
[facility]-EMERGENCY DEPT Provider Note    By signing my name below, I, Earmon PhoenixJennifer Waddell, attest that this documentation has been prepared under the direction and in the presence of Felicie Mornavid Eston Heslin, FNP. Electronically Signed: Earmon PhoenixJennifer Waddell, ED Scribe. 10/19/16. 6:55 PM.   History   Chief Complaint Chief Complaint  Patient presents with  . Rash   The history is provided by the patient and medical records. No language interpreter was used.    HPI Comments:  Anthony [name] is a 55 y.o. male who presents to the Emergency Department complaining of a burning, itching rash that appeared about 12 hours ago. He reports the rash is located on torso, forehead, BLE and BUE. He has taken Vistaril for itching. He denies modifying factors. He denies any new soaps, creams, detergents, other personal hygiene products, foods or medications. He denies fever, chills, nausea, vomiting, difficultly swallowing or breathing. PCP is Dr. Kathrynn SpeedArvind.   Past Medical History:  Diagnosis Date  . Bell's palsy   . Depression   . Post traumatic stress disorder   . Seizures (HCC)    last seizure 12 yrs ago    Patient Active Problem List   Diagnosis Date Noted  . PTSD (post-traumatic stress disorder) 09/12/2014  . MDD (major depressive disorder), severe (HCC) 09/11/2014  . Episodic mood disorder (HCC) 09/11/2014    Past Surgical History:  Procedure Laterality Date  . brain damage         Home Medications    Prior to Admission medications   Medication Sig Start Date End Date Taking? Authorizing Provider  diphenhydrAMINE (BENADRYL) 25 mg capsule Take 1 capsule (25 mg total) by mouth every 6 (six) hours as needed. 10/19/16   Felicie Mornavid Krisinda Giovanni, NP  divalproex (DEPAKOTE ER) 500 MG 24 hr tablet Take 3 tablets (1,500 mg total) by mouth every evening. 09/17/14   Thermon LeylandLaura A Davis, NP  famotidine (PEPCID) 20 MG tablet Take 1 tablet (20 mg total) by mouth 2 (two) times daily. 10/19/16   Felicie Mornavid Tulio Facundo, NP  hydrocortisone cream 1  % Apply to affected area 2 times daily 10/19/16   Felicie Mornavid Lynna Zamorano, NP  ibuprofen (ADVIL,MOTRIN) 800 MG tablet Take 1 tablet (800 mg total) by mouth 2 (two) times daily. 09/17/14   Thermon LeylandLaura A Davis, NP  mirtazapine (REMERON) 7.5 MG tablet Take 1 tablet (7.5 mg total) by mouth at bedtime. 09/17/14   Thermon LeylandLaura A Davis, NP  predniSONE (DELTASONE) 20 MG tablet 2 tabs po daily x 3 days. 10/22/16   Felicie Mornavid Estevan Kersh, NP  QUEtiapine (SEROQUEL) 50 MG tablet Take 1 tablet (50 mg total) by mouth at bedtime as needed (sleep). 09/17/14   Thermon LeylandLaura A Davis, NP  traZODone (DESYREL) 50 MG tablet Take 1 tablet (50 mg total) by mouth at bedtime and may repeat dose one time if needed. 09/17/14   Thermon LeylandLaura A Davis, NP    Family History No family history on file.  Social History Social History  Substance Use Topics  . Smoking status: Current Every Day Smoker    Packs/day: 1.00    Years: 38.00    Types: Cigarettes  . Smokeless tobacco: Not on file  . Alcohol use No     Comment: no etoh ffor 12 yrs     Allergies   Patient has no known allergies.   Review of Systems Review of Systems  Constitutional: Negative for chills and fever.  HENT: Negative for trouble swallowing.   Respiratory: Negative for shortness of breath.   Gastrointestinal: Negative for nausea  and vomiting.  Skin: Positive for rash.  All other systems reviewed and are negative.    Physical Exam Updated Vital Signs BP 129/93 (BP Location: Right Arm)   Pulse 105   Temp 97.9 F (36.6 C) (Oral)   Resp 18   SpO2 100%   Physical Exam  Constitutional: He is oriented to person, place, and time. He appears well-developed and well-nourished.  HENT:  Head: Normocephalic and atraumatic.  Neck: Normal range of motion.  Cardiovascular: Normal rate.   Pulmonary/Chest: Effort normal.  Musculoskeletal: Normal range of motion.  Neurological: He is alert and oriented to person, place, and time.  Skin: Skin is warm and dry. Rash noted.  Erythematous, pruritic  rash on lower extremites, trunk and face.  Psychiatric: He has a normal mood and affect. His behavior is normal.  Nursing note and vitals reviewed.    ED Treatments / Results  DIAGNOSTIC STUDIES: Oxygen Saturation is 100% on RA, normal by my interpretation.   COORDINATION OF CARE: 5:52 PM- Will treat with Decadron injection, Benadryl and Pepcid. Pt verbalizes understanding and agrees to plan.  Medications  dexamethasone (DECADRON) injection 10 mg (10 mg Intramuscular Given 10/19/16 1820)  diphenhydrAMINE (BENADRYL) capsule 25 mg (25 mg Oral Given 10/19/16 1818)  famotidine (PEPCID) tablet 20 mg (20 mg Oral Given 10/19/16 1818)    Labs (all labs ordered are listed, but only abnormal results are displayed) Labs Reviewed - No data to display  EKG  EKG Interpretation None       Radiology No results found.  Procedures Procedures (including critical care time)  Medications Ordered in ED Medications  dexamethasone (DECADRON) injection 10 mg (10 mg Intramuscular Given 10/19/16 1820)  diphenhydrAMINE (BENADRYL) capsule 25 mg (25 mg Oral Given 10/19/16 1818)  famotidine (PEPCID) tablet 20 mg (20 mg Oral Given 10/19/16 1818)     Initial Impression / Assessment and Plan / ED Course  I have reviewed the triage vital signs and the nursing notes.  Pertinent labs & imaging results that were available during my care of the patient were reviewed by me and considered in my medical decision making (see chart for details).  Clinical Course     Patient with nonspecific eruption. No signs of infection. Discharge with symptomatic treatment. Follow up with PCP in 2-3 days.  I personally performed the services described in this documentation, which was scribed in my presence. The recorded information has been reviewed and is accurate.   Final Clinical Impressions(s) / ED Diagnoses   Final diagnoses:  Rash and nonspecific skin eruption    New Prescriptions New Prescriptions    DIPHENHYDRAMINE (BENADRYL) 25 MG CAPSULE    Take 1 capsule (25 mg total) by mouth every 6 (six) hours as needed.   FAMOTIDINE (PEPCID) 20 MG TABLET    Take 1 tablet (20 mg total) by mouth 2 (two) times daily.   HYDROCORTISONE CREAM 1 %    Apply to affected area 2 times daily   PREDNISONE (DELTASONE) 20 MG TABLET    2 tabs po daily x 3 days.     Felicie Mornavid Ajax Schroll, NP 10/19/16 1920    Doug SouSam Jacubowitz, MD 10/20/16 [phone]

## 2016-10-20 ENCOUNTER — Emergency Department (HOSPITAL_COMMUNITY)
Admission: EM | Admit: 2016-10-20 | Discharge: 2016-10-22 | Disposition: A | Discharge status: Other Acute Inpatient Hospital | Payer: Medicare HMO | Attending: Emergency Medicine | Admitting: Emergency Medicine

## 2016-10-20 ENCOUNTER — Encounter (HOSPITAL_COMMUNITY): Payer: Self-pay

## 2016-10-20 DIAGNOSIS — Z79899 Other long term (current) drug therapy: Secondary | ICD-10-CM | POA: Diagnosis not present

## 2016-10-20 DIAGNOSIS — F17210 Nicotine dependence, cigarettes, uncomplicated: Secondary | ICD-10-CM | POA: Insufficient documentation

## 2016-10-20 DIAGNOSIS — R45851 Suicidal ideations: Secondary | ICD-10-CM | POA: Insufficient documentation

## 2016-10-20 DIAGNOSIS — R21 Rash and other nonspecific skin eruption: Secondary | ICD-10-CM | POA: Diagnosis present

## 2016-10-20 DIAGNOSIS — F322 Major depressive disorder, single episode, severe without psychotic features: Secondary | ICD-10-CM | POA: Diagnosis not present

## 2016-10-20 DIAGNOSIS — Z9889 Other specified postprocedural states: Secondary | ICD-10-CM | POA: Diagnosis not present

## 2016-10-20 DIAGNOSIS — T7840XA Allergy, unspecified, initial encounter: Secondary | ICD-10-CM | POA: Insufficient documentation

## 2016-10-20 LAB — RAPID URINE DRUG SCREEN, HOSP PERFORMED
Amphetamines: NOT DETECTED
BARBITURATES: NOT DETECTED
BENZODIAZEPINES: NOT DETECTED
COCAINE: NOT DETECTED
OPIATES: NOT DETECTED
TETRAHYDROCANNABINOL: NOT DETECTED

## 2016-10-20 LAB — CBC WITH DIFFERENTIAL/PLATELET
BASOS ABS: 0 10*3/uL (ref 0.0–0.1)
Basophils Relative: 0 %
EOS ABS: 0 10*3/uL (ref 0.0–0.7)
Eosinophils Relative: 0 %
HEMATOCRIT: 41.4 % (ref 39.0–52.0)
HEMOGLOBIN: 14.2 g/dL (ref 13.0–17.0)
LYMPHS PCT: 4 %
Lymphs Abs: 0.8 10*3/uL (ref 0.7–4.0)
MCH: 29.4 pg (ref 26.0–34.0)
MCHC: 34.3 g/dL (ref 30.0–36.0)
MCV: 85.7 fL (ref 78.0–100.0)
MONOS PCT: 3 %
Monocytes Absolute: 0.6 10*3/uL (ref 0.1–1.0)
NEUTROS ABS: 18.3 10*3/uL — AB (ref 1.7–7.7)
NEUTROS PCT: 93 %
Platelets: 285 10*3/uL (ref 150–400)
RBC: 4.83 MIL/uL (ref 4.22–5.81)
RDW: 14 % (ref 11.5–15.5)
WBC: 19.7 10*3/uL — AB (ref 4.0–10.5)

## 2016-10-20 LAB — ETHANOL

## 2016-10-20 LAB — COMPREHENSIVE METABOLIC PANEL
ALBUMIN: 3.8 g/dL (ref 3.5–5.0)
ALT: 20 U/L (ref 17–63)
ANION GAP: 11 (ref 5–15)
AST: 25 U/L (ref 15–41)
Alkaline Phosphatase: 54 U/L (ref 38–126)
BILIRUBIN TOTAL: 0.6 mg/dL (ref 0.3–1.2)
BUN: 37 mg/dL — AB (ref 6–20)
CALCIUM: 8.8 mg/dL — AB (ref 8.9–10.3)
CO2: 21 mmol/L — AB (ref 22–32)
CREATININE: 1.49 mg/dL — AB (ref 0.61–1.24)
Chloride: 106 mmol/L (ref 101–111)
GFR calc non Af Amer: 52 mL/min — ABNORMAL LOW (ref >60)
Glucose, Bld: 130 mg/dL — ABNORMAL HIGH (ref 65–99)
POTASSIUM: 4.4 mmol/L (ref 3.5–5.1)
Sodium: 138 mmol/L (ref 135–145)
Total Protein: 6.4 g/dL — ABNORMAL LOW (ref 6.5–8.1)

## 2016-10-20 MED ORDER — PREDNISONE 20 MG PO TABS
60.0000 mg | ORAL_TABLET | Freq: Once | ORAL | Status: AC
  Administered 2016-10-20: 60 mg via ORAL
  Filled 2016-10-20: qty 3

## 2016-10-20 MED ORDER — HYDROXYZINE HCL 50 MG PO TABS
50.0000 mg | ORAL_TABLET | Freq: Three times a day (TID) | ORAL | Status: DC | PRN
  Administered 2016-10-21 (×2): 50 mg via ORAL
  Filled 2016-10-20 (×2): qty 1

## 2016-10-20 MED ORDER — VENLAFAXINE HCL ER 150 MG PO CP24
150.0000 mg | ORAL_CAPSULE | Freq: Every day | ORAL | Status: DC
  Administered 2016-10-21 – 2016-10-22 (×2): 150 mg via ORAL
  Filled 2016-10-20 (×2): qty 1

## 2016-10-20 MED ORDER — DIPHENHYDRAMINE HCL 50 MG/ML IJ SOLN: 25.0000 mg | Freq: Once | INTRAMUSCULAR | Status: DC

## 2016-10-20 MED ORDER — RANITIDINE HCL 150 MG/10ML PO SYRP
150.0000 mg | ORAL_SOLUTION | Freq: Once | ORAL | Status: AC
  Administered 2016-10-20: 150 mg via ORAL
  Filled 2016-10-20: qty 10

## 2016-10-20 MED ORDER — DIPHENHYDRAMINE HCL 50 MG/ML IJ SOLN
INTRAMUSCULAR | Status: AC
  Administered 2016-10-20: 25 mg
  Filled 2016-10-20: qty 1

## 2016-10-20 NOTE — ED Notes (Signed)
Pt on with TTS.  

## 2016-10-20 NOTE — BH Assessment (Addendum)
Tele Assessment Note   [place] [name] is a 55 y.o. male, [facility]. Pt initially presented to the ED for treatment of an apparent allergic reaction. He was also in the ED on yesterday for that same reason. Upon completion of EDP's assessment, pt indicated that he was having SI w/ a plan to jump off of a bridge and requested a psych eval.  Pt reported that he had not been on his meds in 2 months. He indicated that he had been having SI "all last week" and had a plan to jump off of a bridge sometime last week, but didn't b/c "it got too late". Pt has been participating in an IP drug treatment program [facility] and has been there "1 week today". Pt has not advised staff with the program of his SI nor did he advise ED staff yesterday. Upon inquiry, pt stated, "I felt more suicidal today than yesterday". Pt identified the trigger to his SI being his "ex-old lady left me in August and I've been smoking crack and going crazy". It is noted that pt rec'd IP treatment at [facility] in August and September. When asked if he could contract for safety, pt replied, "yeah".  Clinician reiterated the question, explaining what contracting for safety meant and pt again replied, "yeah". When asked about last use of drugs, pt first stated "last month" and then indicated "last month and the beginning of this month". Lastly pt admitted that he used "10 days ago". Pt's UDS has not yet been completed. Disposition is deferred until labs have resulted. Pt will be seen in the morning by psychiatry.    Diagnosis: MDD, recurrent episode, severe; Cocaine Use d/o, moderate  Past Medical History:  Past Medical History:  Diagnosis Date  . Bell's palsy   . Depression   . Post traumatic stress disorder   . Seizures (HCC)    last seizure 12 yrs ago    Past Surgical History:  Procedure Laterality Date  . APPENDECTOMY    . brain damage    . CHOLECYSTECTOMY    . FOOT  SURGERY Bilateral   . KNEE SURGERY Right     Family History: No family history on file.  Social History:  reports that he has been smoking Cigarettes.  He has a 38.00 pack-year smoking history. He has never used smokeless tobacco. He reports that he uses drugs, including Marijuana. He reports that he does not drink alcohol.  Additional Social History:  Alcohol / Drug Use Pain Medications: see PTA meds Prescriptions: see PTA meds Over the Counter: see PTA meds History of alcohol / drug use?: Yes (pt admits to crack use. Vague with the details. Labs not completed yet.)  CIWA: CIWA-Ar BP: 123/79 Pulse Rate: 89 COWS:    PATIENT STRENGTHS: (choose at least two) Active sense of humor Average or above average intelligence Capable of independent living Motivation for treatment/growth  Allergies: No Known Allergies  Home Medications:  (Not in a hospital admission)  OB/GYN Status:  No LMP for male patient.  General Assessment Data [facility] Assessment: In system Is this a Tele or Face-to-Face Assessment?: Tele Assessment Is this an Initial Assessment or a Re-assessment for this encounter?: Initial Assessment Marital status: Single Living Arrangements: Other (Comment) [facility] Can pt return to current living arrangement?: Yes Admission Status: Voluntary Is patient capable of signing voluntary admission?: Yes Referral Source: Self/Family/Friend     Crisis Care  Plan Living Arrangements: Other (Comment) [facility] Name of Psychiatrist: pt [facility] Lil[facility]McDonald [facility] Name of Therapist: [facility]  Education Status Is patient currently in school?: No  Risk to self with the past 6 months Suicidal Ideation: Yes-Currently Present Has patient been a risk to self within the past 6 months prior to admission? : No Suicidal Intent: No-Not Currently/Within Last 6 Months Has patient had any suicidal intent within the past 6 months prior  to admission? : No Is patient at risk for suicide?: Yes Suicidal Plan?: No-Not Currently/Within Last 6 Months Has patient had any suicidal plan within the past 6 months prior to admission? : Yes Access to Means: Yes What has been your use of drugs/alcohol within the last 12 months?: see above Previous Attempts/Gestures: Yes How many times?: 2 Triggers for Past Attempts: Unknown Intentional Self Injurious Behavior: None Family Suicide History: Unknown Recent stressful life event(s): Other (Comment) (girlfriend left him in August) Persecutory voices/beliefs?: No Depression: Yes Depression Symptoms: Insomnia, Tearfulness, Isolating, Guilt, Loss of interest in usual pleasures, Feeling worthless/self pity, Feeling angry/irritable Substance abuse history and/or treatment for substance abuse?: Yes Suicide prevention information given to non-admitted patients: Not applicable  Risk to Others within the past 6 months Homicidal Ideation: No Does patient have any lifetime risk of violence toward others beyond the six months prior to admission? : No Thoughts of Harm to Others: No Current Homicidal Intent: No Current Homicidal Plan: No Access to Homicidal Means: No History of harm to others?: No Assessment of Violence: None Noted Does patient have access to weapons?: No Criminal Charges Pending?: No Does patient have a court date: No Is patient on probation?: No  Psychosis Hallucinations: None noted Delusions: None noted  Mental Status Report Appearance/Hygiene: Unremarkable Eye Contact: Good Motor Activity: Unremarkable Speech: Logical/coherent Level of Consciousness: Alert Mood: Apathetic Affect: Appropriate to circumstance Anxiety Level: None Thought Processes: Coherent, Relevant Judgement: Partial Orientation: Person, Place, Time, Situation Obsessive Compulsive Thoughts/Behaviors: None  Cognitive Functioning Concentration: Normal Memory: Recent Intact, Remote Impaired IQ:  Average Insight: see judgement above Impulse Control: Fair Appetite: Good Weight Gain: 10 Sleep: Decreased Vegetative Symptoms: None  ADLScreening [facility]) Patient's cognitive ability adequate to safely complete daily activities?: Yes Patient able to express need for assistance with ADLs?: Yes Independently performs ADLs?: Yes (appropriate for developmental age)  Prior Inpatient Therapy Prior Inpatient Therapy: Yes Prior Therapy Dates: 06/2016; 07/2016; 2015 Prior Therapy Facilty/Provider(s): [facility]; [facility] Reason for Treatment: SI; drug use  Prior Outpatient Therapy Prior Outpatient Therapy: No Does patient have an ACCT team?: No Does patient have Intensive In-House Services?  : No Does patient have [facility] services? : No Does patient have P4CC services?: No  ADL Screening (condition at time of admission) Patient's cognitive ability adequate to safely complete daily activities?: Yes Is the patient deaf or have difficulty hearing?: No Does the patient have difficulty seeing, even when wearing glasses/contacts?: No Does the patient have difficulty concentrating, remembering, or making decisions?: No Patient able to express need for assistance with ADLs?: Yes Does the patient have difficulty dressing or bathing?: No Independently performs ADLs?: Yes (appropriate for developmental age) Does the patient have difficulty walking or climbing stairs?: No Weakness of Legs: None Weakness of Arms/Hands: None  Home Assistive Devices/Equipment Home Assistive Devices/Equipment: None    Abuse/Neglect Assessment (Assessment to be complete while patient is alone) Physical Abuse: Denies Verbal Abuse: Denies Sexual Abuse: Denies Exploitation of patient/patient's resources: Denies Self-Neglect: Denies Values / Beliefs  Cultural Requests During Hospitalization: None   Advance Directives (For Healthcare) Does Patient Have a Medical Advance Directive?: Yes Does patient want  to make changes to medical advance directive?: No - Patient declined Type of Advance Directive: Living will    Additional Information 1:1 In Past 12 Months?: No CIRT Risk: No Elopement Risk: No Does patient have medical clearance?: No     Disposition:  Disposition Initial Assessment Completed for this Encounter: Yes (consulted with Elta Guadeloupe, NP) Disposition of Patient: Other dispositions Other disposition(s): Other (Comment) (Pt to be re-evaluated by psych in the AM once labs result)  Laddie Aquas 10/20/2016 7:22 PM

## 2016-10-20 NOTE — ED Notes (Signed)
Pt called out\. Increase in hives and itching. Charge Nurse made aware

## 2016-10-20 NOTE — ED Triage Notes (Signed)
Per [facility]: [facility] [place] [facility], was given medication and sent home. About 4 hours ago the rash started to reappear. Over the last 2 hours the pt started having shortness of breath and wheezing. Pt had hives on arms, back, chest, groin, and legs. Pt had sensation of tightness in his throat. EMS placed the pt on 4 L [place].   EMS gave 50 mg benadryl PO 0.3 epi 1:1 IM left deltoid 125 mg solumedrol IV 5 mg albuterol hhn  Wheezing has stopped, does still have some coarse rhonchi.  The hotel recently changed its soap.

## 2016-10-20 NOTE — ED Notes (Signed)
Pt urinated in the bed, full linen changed.

## 2016-10-20 NOTE — ED Provider Notes (Signed)
[facility] DEPT Provider Note   CSN: [id number] Arrival date & time: 10/20/16  1634     History   Chief Complaint No chief complaint on file.   HPI Anthony [name] is a 55 y.o. male.  HPI   Inpt drug treatment program, new foods, [place], ham, chicken, cabbage, greens, mac and cheese, biscuit No new medications yet No hx of allergic reaction  Yesterday after eating also had rash (hamburger/rice) itching, went to [place] and took steroids, famotidine, benadryl Unable to fill rx yet  Today 12PM right after lunch was in drug treatment class and started to have symptoms of itching, shortness of breath Benadryl in ambulance and feeling better Now rash and breathing is better, in fact rash has resolved.  Suicidal ideation over last week, worse today.  Thinks about jumping off of a bridge. Would like a psychiatric evaluation.     Past Medical History:  Diagnosis Date  . Bell's palsy   . Depression   . Post traumatic stress disorder   . Seizures (HCC)    last seizure 12 yrs ago    Patient Active Problem List   Diagnosis Date Noted  . PTSD (post-traumatic stress disorder) 09/12/2014  . MDD (major depressive disorder), severe (HCC) 09/11/2014  . Episodic mood disorder (HCC) 09/11/2014    Past Surgical History:  Procedure Laterality Date  . APPENDECTOMY    . brain damage    . CHOLECYSTECTOMY    . FOOT SURGERY Bilateral   . KNEE SURGERY Right        Home Medications    Prior to Admission medications   Medication Sig Start Date End Date Taking? Authorizing Provider  hydrOXYzine (ATARAX/VISTARIL) 50 MG tablet Take 50 mg by mouth 3 (three) times daily as needed for anxiety or itching.    Historical Provider, MD  QUEtiapine (SEROQUEL) 50 MG tablet Take 1 tablet (50 mg total) by mouth at bedtime as needed (sleep). Patient not taking: Reported on 10/20/2016 09/17/14   Thermon LeylandLaura A Davis, NP  traZODone (DESYREL) 50 MG tablet Take 1 tablet (50 mg total) by mouth at  bedtime and may repeat dose one time if needed. Patient not taking: Reported on 10/20/2016 09/17/14   Thermon LeylandLaura A Davis, NP  venlafaxine XR (EFFEXOR-XR) 150 MG 24 hr capsule Take 150 mg by mouth daily with breakfast.    Historical Provider, MD    Family History No family history on file.  Social History Social History  Substance Use Topics  . Smoking status: Current Every Day Smoker    Packs/day: 1.00    Years: 38.00    Types: Cigarettes  . Smokeless tobacco: Never Used  . Alcohol use No     Comment: no etoh ffor 20     Allergies   Nuedexta [dextromethorphan-quinidine]   Review of Systems Review of Systems  Constitutional: Negative for fever.  HENT: Negative for sore throat.   Eyes: Negative for visual disturbance.  Respiratory: Positive for shortness of breath.   Cardiovascular: Negative for chest pain.  Gastrointestinal: Negative for abdominal pain, nausea and vomiting.  Genitourinary: Negative for difficulty urinating.  Musculoskeletal: Negative for back pain and neck stiffness.  Skin: Positive for rash.  Neurological: Negative for syncope and headaches.  Psychiatric/Behavioral: Positive for suicidal ideas.     Physical Exam Updated Vital Signs BP 115/75   Pulse 81   Temp 98.5 F (36.9 C)   Resp 26   Ht 5\' 9"  (1.753 m)   Wt 220 lb (99.8 kg)  SpO2 94%   BMI 32.49 kg/m   Physical Exam  Constitutional: He is oriented to person, place, and time. He appears well-developed and well-nourished. No distress.  HENT:  Head: Normocephalic and atraumatic.  Eyes: Conjunctivae and EOM are normal.  Neck: Normal range of motion.  Cardiovascular: Normal rate, regular rhythm, normal heart sounds and intact distal pulses.  Exam reveals no gallop and no friction rub.   No murmur heard. Pulmonary/Chest: Effort normal and breath sounds normal. No respiratory distress. He has no wheezes. He has no rales.  Abdominal: Soft. He exhibits no distension. There is no tenderness. There  is no guarding.  Musculoskeletal: He exhibits no edema.  Neurological: He is alert and oriented to person, place, and time.  Skin: Skin is warm and dry. No rash (no rash at this time) noted. He is not diaphoretic.  Psychiatric: He is not actively hallucinating. He expresses suicidal ideation. He expresses no homicidal ideation. He expresses suicidal plans. He expresses no homicidal plans.  Nursing note and vitals reviewed.    ED Treatments / Results  Labs (all labs ordered are listed, but only abnormal results are displayed) Labs Reviewed  COMPREHENSIVE METABOLIC PANEL - Abnormal; Notable for the following:       Result Value   CO2 21 (*)    Glucose, Bld 130 (*)    BUN 37 (*)    Creatinine, Ser 1.49 (*)    Calcium 8.8 (*)    Total Protein 6.4 (*)    GFR calc non Af Amer 52 (*)    All other components within normal limits  CBC WITH DIFFERENTIAL/PLATELET - Abnormal; Notable for the following:    WBC 19.7 (*)    Neutro Abs 18.3 (*)    All other components within normal limits  ETHANOL  RAPID URINE DRUG SCREEN, HOSP PERFORMED    EKG  EKG Interpretation None       Radiology No results found.  Procedures Procedures (including critical care time)  Medications Ordered in ED Medications  diphenhydrAMINE (BENADRYL) injection 25 mg ( Intravenous Canceled Entry 10/20/16 2007)  hydrOXYzine (ATARAX/VISTARIL) tablet 50 mg (50 mg Oral Given 10/21/16 0034)  venlafaxine XR (EFFEXOR-XR) 24 hr capsule 150 mg (not administered)  predniSONE (DELTASONE) tablet 20 mg (not administered)  predniSONE (DELTASONE) tablet 60 mg (60 mg Oral Given 10/20/16 1917)  ranitidine (ZANTAC) 150 MG/10ML syrup 150 mg (150 mg Oral Given 10/20/16 1917)  diphenhydrAMINE (BENADRYL) 50 MG/ML injection (25 mg  Given 10/20/16 1958)  diphenhydrAMINE (BENADRYL) injection 25 mg (25 mg Intravenous Given 10/21/16 0120)     Initial Impression / Assessment and Plan / ED Course  I have reviewed the triage vital  signs and the nursing notes.  Pertinent labs & imaging results that were available during my care of the patient were reviewed by me and considered in my medical decision making (see chart for details).  Clinical Course    55 year old male with history of depression, posttraumatic stress disorder, seizure, presents with concern for rash and suicidal ideation. Patient was evaluated at [facility] last night for concern of pruritic rash, reports that he received prednisone, Zantac and Benadryl and felt significantly improved and went home. Unclear trigger for these episodes, with other episode beginning today after lunch. Patient reported shortness of breath with the episode, however denies any dyspnea at this time and reports full resolution with Benadryl. At this time there are no signs of anaphylaxis.  Suspect possible allergic reaction, although unclear cause, with several new  exposures (soaps/linen/food) at inpatient drug treatment facility. Recommend continuing the prednisone that was prescribed last night.  Patient is medically cleared, concern for mild allergic reaction. Recommend continuing the prednisone he been previously prescribed at discharge.  After discussing patient's allergic reaction, he reports that he's having suicidal ideation with thoughts of jumping off a bridge. Consult TTS. Patient is voluntary at this time and medically cleared. Home meds ordered.   Final Clinical Impressions(s) / ED Diagnoses   Final diagnoses:  Rash  Allergic reaction, initial encounter  Suicidal ideation    New Prescriptions New Prescriptions   No medications on file     Alvira MondayErin Ameliya Nicotra, MD 10/21/16 [phone]

## 2016-10-21 DIAGNOSIS — F322 Major depressive disorder, single episode, severe without psychotic features: Secondary | ICD-10-CM

## 2016-10-21 DIAGNOSIS — R21 Rash and other nonspecific skin eruption: Secondary | ICD-10-CM | POA: Diagnosis not present

## 2016-10-21 DIAGNOSIS — Z9889 Other specified postprocedural states: Secondary | ICD-10-CM

## 2016-10-21 DIAGNOSIS — Z888 Allergy status to other drugs, medicaments and biological substances status: Secondary | ICD-10-CM

## 2016-10-21 DIAGNOSIS — T7840XA Allergy, unspecified, initial encounter: Secondary | ICD-10-CM

## 2016-10-21 DIAGNOSIS — R45851 Suicidal ideations: Secondary | ICD-10-CM | POA: Insufficient documentation

## 2016-10-21 DIAGNOSIS — R45850 Homicidal ideations: Secondary | ICD-10-CM

## 2016-10-21 DIAGNOSIS — F17210 Nicotine dependence, cigarettes, uncomplicated: Secondary | ICD-10-CM

## 2016-10-21 DIAGNOSIS — Z79899 Other long term (current) drug therapy: Secondary | ICD-10-CM

## 2016-10-21 LAB — VALPROIC ACID LEVEL: Valproic Acid Lvl: <10 ug/mL — ABNORMAL LOW (ref 50.0–100.0)

## 2016-10-21 MED ORDER — PREDNISONE 20 MG PO TABS
20.0000 mg | ORAL_TABLET | Freq: Every day | ORAL | Status: DC
  Administered 2016-10-21 – 2016-10-22 (×2): 20 mg via ORAL
  Filled 2016-10-21 (×2): qty 1

## 2016-10-21 MED ORDER — DIPHENHYDRAMINE HCL 25 MG PO CAPS
25.0000 mg | ORAL_CAPSULE | Freq: Four times a day (QID) | ORAL | Status: DC | PRN
  Administered 2016-10-21 – 2016-10-22 (×3): 25 mg via ORAL
  Filled 2016-10-21 (×4): qty 1

## 2016-10-21 MED ORDER — QUETIAPINE FUMARATE 25 MG PO TABS
50.0000 mg | ORAL_TABLET | Freq: Every evening | ORAL | Status: DC | PRN
  Administered 2016-10-21: 50 mg via ORAL
  Filled 2016-10-21: qty 2

## 2016-10-21 MED ORDER — DIPHENHYDRAMINE HCL 50 MG/ML IJ SOLN
25.0000 mg | Freq: Once | INTRAMUSCULAR | Status: AC
  Administered 2016-10-21: 25 mg via INTRAVENOUS
  Filled 2016-10-21: qty 1

## 2016-10-21 MED ORDER — NICOTINE 21 MG/24HR TD PT24
21.0000 mg | MEDICATED_PATCH | Freq: Every day | TRANSDERMAL | Status: DC
  Administered 2016-10-21 – 2016-10-22 (×2): 21 mg via TRANSDERMAL
  Filled 2016-10-21 (×2): qty 1
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
## 2016-10-21 NOTE — ED Notes (Signed)
Pt reports increased itching and requests benadryl - states Vistaril made itching worse yesterday. Dansie PA made aware, VSS.

## 2016-10-21 NOTE — ED Notes (Signed)
Pt ambulated to restroom with sitter. Gait normal

## 2016-10-21 NOTE — ED Notes (Signed)
Spoke with PA regarding the low Valpoiric level he is aware and does not want to order any additional medications at this time

## 2016-10-21 NOTE — ED Notes (Signed)
MD notified that patient is itching and was seen for allergic reaction. MD gave order for some medication

## 2016-10-21 NOTE — ED Notes (Signed)
Pt using phone appears calm and cooperative at this time

## 2016-10-21 NOTE — Progress Notes (Signed)
Patient Referral submitted to:  [facility], [place], [facility], [facility], [place], [place], [place], [address] [facility], [address], [place], [place], [place], [place], [place], [place], [place], [place], [place], [facility]  Dalicia Kisner K. Sherlon HandingHarris, LCAS-A, LPC-A, [facility]  Counselor 10/21/2016 5:54 PM

## 2016-10-21 NOTE — ED Notes (Signed)
[facility] for [facility] for placement at this time will expect to hear something the earliest tomorrow

## 2016-10-22 DIAGNOSIS — T7840XA Allergy, unspecified, initial encounter: Secondary | ICD-10-CM | POA: Diagnosis not present

## 2016-10-22 NOTE — ED Notes (Signed)
[facility] and may be transferred after 10am

## 2016-10-22 NOTE — BH Assessment (Signed)
[facility] stating Pt has been accepted to their 3-North unit by Dr. Enedina FinnerGoli. Pt can be transported after 1000. Number for nursing report is [phone]. Notified Marlon Peliffany Greene, PA and Shanda BumpsJessica, RN of acceptance.   Harlin RainFord Ellis Patsy BaltimoreWarrick Jr, LPC, [facility], [facility] Triage Specialist [phone]

## 2016-10-22 NOTE — ED Provider Notes (Signed)
Assumed care at 7 AM. Patient accepted to outpt bed. On assessment, well-appearing. Labs reviewed, medically stable. He has mild leukocytosis 2/2 steroid use. Cr 1.49 but can follow-up as outpt, tolerating PO. Will have outpt PCP f/u, admit to psych unit.   Anthony Pollackameron Lugenia Assefa, MD 10/22/16 [phone]

## 2016-10-22 NOTE — ED Notes (Signed)
[facility] but is concerned about elevated WBC. Would like to be called at [phone], [phone]

## 2016-11-25 ENCOUNTER — Emergency Department (HOSPITAL_COMMUNITY)
Admission: EM | Admit: 2016-11-25 | Discharge: 2016-11-25 | Disposition: A | Discharge status: Home / Self Care | Payer: Medicare HMO | Attending: Emergency Medicine | Admitting: Emergency Medicine

## 2016-11-25 ENCOUNTER — Encounter (HOSPITAL_COMMUNITY): Payer: Self-pay | Admitting: Emergency Medicine

## 2016-11-25 DIAGNOSIS — F17210 Nicotine dependence, cigarettes, uncomplicated: Secondary | ICD-10-CM | POA: Insufficient documentation

## 2016-11-25 DIAGNOSIS — M545 Low back pain, unspecified: Secondary | ICD-10-CM

## 2016-11-25 DIAGNOSIS — M62830 Muscle spasm of back: Secondary | ICD-10-CM | POA: Diagnosis not present

## 2016-11-25 MED ORDER — METHOCARBAMOL 500 MG PO TABS
500.0000 mg | ORAL_TABLET | Freq: Once | ORAL | Status: AC
Start: 2016-11-25 — End: 2016-11-25
  Administered 2016-11-25: 500 mg via ORAL
  Filled 2016-11-25: qty 1

## 2016-11-25 MED ORDER — NAPROXEN 500 MG PO TABS: 500.0000 mg | ORAL_TABLET | Freq: Two times a day (BID) | ORAL | 0 refills | Status: DC

## 2016-11-25 MED ORDER — CARISOPRODOL 350 MG PO TABS: 350.0000 mg | ORAL_TABLET | Freq: Four times a day (QID) | ORAL | 0 refills | Status: DC | PRN

## 2016-11-25 MED ORDER — KETOROLAC TROMETHAMINE 60 MG/2ML IM SOLN
60.0000 mg | Freq: Once | INTRAMUSCULAR | Status: AC
  Administered 2016-11-25: 60 mg via INTRAMUSCULAR
  Filled 2016-11-25: qty 2

## 2016-11-25 NOTE — ED Notes (Signed)
Pt departed in NAD, refused use of wheelchair.  

## 2016-11-25 NOTE — ED Provider Notes (Signed)
[facility]-EMERGENCY DEPT Provider Note   CSN: [id number] Arrival date & time: 11/25/16  1934  By signing my name below, I, Orpah CobbMaurice Copeland, attest that this documentation has been prepared under the direction and in the presence of Felicie Mornavid Jahni Nazar, NP-C. Electronically Signed: Orpah CobbMaurice Copeland , ED Scribe. 11/25/16. 9:52 PM.   History   Chief Complaint Chief Complaint  Patient presents with  . Back Pain    HPI  HPI Comments:  Anthony [name] is a 55 y.o. male with h/o brain damage (1989) who presents to the Emergency Department complaining of mild to moderate back pain with sudden onset x1 day. Pt states that after getting out of the shower earlier tonight, he started experiencing sharp back spasms across the lower back. Pt denies any previous injuries, heavy lifting, falls. He states that ambulation exacerbates the back pain. Pt denies weakness, bladder/bowel incontinence, numbness between the legs. Pt is a smoker.    The history is provided by the patient. No language interpreter was used.    Past Medical History:  Diagnosis Date  . Bell's palsy   . Depression   . Post traumatic stress disorder   . Seizures ([facility])    last seizure 12 yrs ago    Patient Active Problem List   Diagnosis Date Noted  . Allergic reaction   . Rash   . Suicidal ideation   . PTSD (post-traumatic stress disorder) 09/12/2014  . MDD (major depressive disorder), severe ([facility]) 09/11/2014  . Episodic mood disorder ([facility]) 09/11/2014    Past Surgical History:  Procedure Laterality Date  . APPENDECTOMY    . brain damage    . CHOLECYSTECTOMY    . FOOT SURGERY Bilateral   . KNEE SURGERY Right        Home Medications    Prior to Admission medications   Medication Sig Start Date End Date Taking? Authorizing Provider  hydrOXYzine (ATARAX/VISTARIL) 50 MG tablet Take 50 mg by mouth 3 (three) times daily as needed for anxiety or itching.    Historical Provider, MD  QUEtiapine (SEROQUEL) 50 MG tablet Take 1  tablet (50 mg total) by mouth at bedtime as needed (sleep). Patient not taking: Reported on 10/20/2016 09/17/14   Thermon LeylandLaura A Davis, NP  traZODone (DESYREL) 50 MG tablet Take 1 tablet (50 mg total) by mouth at bedtime and may repeat dose one time if needed. Patient not taking: Reported on 10/20/2016 09/17/14   Thermon LeylandLaura A Davis, NP  venlafaxine XR (EFFEXOR-XR) 150 MG 24 hr capsule Take 150 mg by mouth daily with breakfast.    Historical Provider, MD    Family History No family history on file.  Social History Social History  Substance Use Topics  . Smoking status: Current Every Day Smoker    Packs/day: 1.00    Years: 38.00    Types: Cigarettes  . Smokeless tobacco: Never Used  . Alcohol use No     Comment: no etoh ffor 20     Allergies   Nuedexta [dextromethorphan-quinidine]   Review of Systems Review of Systems  Constitutional: Negative for chills and fever.  HENT: Negative for ear pain and sore throat.   Eyes: Negative for pain and visual disturbance.  Respiratory: Negative for cough and shortness of breath.   Cardiovascular: Negative for chest pain and palpitations.  Gastrointestinal: Negative for abdominal pain and vomiting.  Genitourinary: Negative for dysuria and hematuria.  Musculoskeletal: Positive for back pain. Negative for arthralgias.  Skin: Negative for color change and rash.  Neurological: Negative for seizures,  syncope, weakness and numbness.  All other systems reviewed and are negative.    Physical Exam Updated Vital Signs BP 121/85 (BP Location: Right Arm)   Pulse 66   Temp 98.7 F (37.1 C) (Oral)   Resp 16   Ht 5\' 9"  (1.753 m)   Wt 214 lb (97.1 kg)   SpO2 96%   BMI 31.60 kg/m   Physical Exam  Constitutional: He appears well-developed and well-nourished.  HENT:  Head: Normocephalic and atraumatic.  Eyes: Conjunctivae are normal.  Neck: Neck supple.  Cardiovascular: Normal rate and regular rhythm.   No murmur heard. Pulmonary/Chest: Effort  normal and breath sounds normal. No respiratory distress.  Abdominal: Soft. There is no tenderness.  Musculoskeletal: He exhibits no edema.  Neurological: He is alert. He has normal strength. No cranial nerve deficit.  No strength deficits in legs, reluctance to move due to pain, yet strength is equal bilaterally.   Skin: Skin is warm and dry.  Psychiatric: He has a normal mood and affect.  Nursing note and vitals reviewed.    ED Treatments / Results   DIAGNOSTIC STUDIES: Oxygen Saturation is 96% on RA, adequate by my interpretation.   COORDINATION OF CARE: 9:53 PM-Discussed next steps with pt. Pt verbalized understanding and is agreeable with the plan.    Labs (all labs ordered are listed, but only abnormal results are displayed) Labs Reviewed - No data to display  EKG  EKG Interpretation None       Radiology No results found.  Procedures Procedures (including critical care time)  Medications Ordered in ED Medications - No data to display   Initial Impression / Assessment and Plan / ED Course  I have reviewed the triage vital signs and the nursing notes.  Pertinent labs & imaging results that were available during my care of the patient were reviewed by me and considered in my medical decision making (see chart for details).  Clinical Course   Patient with back pain.  No neurological deficits and normal neuro exam.  Patient is ambulatory.  No loss of bowel or bladder control.  No concern for cauda equina.  No fever, night sweats, weight loss, h/o cancer, IVDA, no recent procedure to back. No urinary symptoms suggestive of UTI.  Supportive care and return precaution discussed. Appears safe for discharge at this time. Follow up as indicated in discharge paperwork.     Final Clinical Impressions(s) / ED Diagnoses   Final diagnoses:  Acute bilateral low back pain without sciatica  Muscle spasm of back    New Prescriptions Discharge Medication List as of  11/25/2016 10:47 PM    START taking these medications   Details  carisoprodol (SOMA) 350 MG tablet Take 1 tablet (350 mg total) by mouth 4 (four) times daily as needed for muscle spasms., Starting Thu 11/25/2016, Print    naproxen (NAPROSYN) 500 MG tablet Take 1 tablet (500 mg total) by mouth 2 (two) times daily., Starting Thu 11/25/2016, Print       I personally performed the services described in this documentation, which was scribed in my presence. The recorded information has been reviewed and is accurate.     Felicie Mornavid Jasma Seevers, NP 11/26/16 0118    Laurence Spatesachel Morgan Little, MD 11/26/16 [phone]

## 2016-11-25 NOTE — ED Triage Notes (Signed)
Patient reports low back pain onset last night , denies injury /ambulatory , no urinary discomfort , pain increases with movement /changing positions .

## 2016-12-08 ENCOUNTER — Encounter (HOSPITAL_COMMUNITY): Payer: Self-pay | Admitting: Behavioral Health

## 2016-12-08 ENCOUNTER — Emergency Department (HOSPITAL_COMMUNITY)
Admission: EM | Admit: 2016-12-08 | Discharge: 2016-12-09 | Disposition: A | Discharge status: Other Acute Inpatient Hospital | Payer: Medicare HMO | Attending: Emergency Medicine | Admitting: Emergency Medicine

## 2016-12-08 ENCOUNTER — Encounter (HOSPITAL_COMMUNITY): Payer: Self-pay | Admitting: Emergency Medicine

## 2016-12-08 ENCOUNTER — Ambulatory Visit (HOSPITAL_COMMUNITY)
Admission: RE | Admit: 2016-12-08 | Discharge: 2016-12-08 | Disposition: A | Discharge status: Home / Self Care | Payer: Medicare HMO | Attending: Psychiatry | Admitting: Psychiatry

## 2016-12-08 DIAGNOSIS — F17210 Nicotine dependence, cigarettes, uncomplicated: Secondary | ICD-10-CM | POA: Insufficient documentation

## 2016-12-08 DIAGNOSIS — Z79899 Other long term (current) drug therapy: Secondary | ICD-10-CM | POA: Insufficient documentation

## 2016-12-08 DIAGNOSIS — F4310 Post-traumatic stress disorder, unspecified: Secondary | ICD-10-CM | POA: Diagnosis present

## 2016-12-08 DIAGNOSIS — F314 Bipolar disorder, current episode depressed, severe, without psychotic features: Secondary | ICD-10-CM | POA: Diagnosis present

## 2016-12-08 DIAGNOSIS — F322 Major depressive disorder, single episode, severe without psychotic features: Secondary | ICD-10-CM | POA: Diagnosis present

## 2016-12-08 DIAGNOSIS — F332 Major depressive disorder, recurrent severe without psychotic features: Secondary | ICD-10-CM | POA: Insufficient documentation

## 2016-12-08 DIAGNOSIS — F251 Schizoaffective disorder, depressive type: Secondary | ICD-10-CM | POA: Diagnosis present

## 2016-12-08 LAB — COMPREHENSIVE METABOLIC PANEL
ALT: 19 U/L (ref 17–63)
ANION GAP: 7 (ref 5–15)
AST: 20 U/L (ref 15–41)
Albumin: 4.8 g/dL (ref 3.5–5.0)
Alkaline Phosphatase: 70 U/L (ref 38–126)
BILIRUBIN TOTAL: 0.6 mg/dL (ref 0.3–1.2)
BUN: 22 mg/dL — ABNORMAL HIGH (ref 6–20)
CO2: 25 mmol/L (ref 22–32)
Calcium: 9 mg/dL (ref 8.9–10.3)
Chloride: 104 mmol/L (ref 101–111)
Creatinine, Ser: 0.77 mg/dL (ref 0.61–1.24)
Glucose, Bld: 98 mg/dL (ref 65–99)
POTASSIUM: 4 mmol/L (ref 3.5–5.1)
Sodium: 136 mmol/L (ref 135–145)
TOTAL PROTEIN: 8 g/dL (ref 6.5–8.1)

## 2016-12-08 LAB — CBC
HCT: 44 % (ref 39.0–52.0)
Hemoglobin: 15.3 g/dL (ref 13.0–17.0)
MCH: 29.5 pg (ref 26.0–34.0)
MCHC: 34.8 g/dL (ref 30.0–36.0)
MCV: 84.9 fL (ref 78.0–100.0)
PLATELETS: 212 10*3/uL (ref 150–400)
RBC: 5.18 MIL/uL (ref 4.22–5.81)
RDW: 14.3 % (ref 11.5–15.5)
WBC: 13.3 10*3/uL — ABNORMAL HIGH (ref 4.0–10.5)

## 2016-12-08 LAB — RAPID URINE DRUG SCREEN, HOSP PERFORMED
AMPHETAMINES: NOT DETECTED
Barbiturates: NOT DETECTED
Benzodiazepines: NOT DETECTED
Cocaine: NOT DETECTED
OPIATES: NOT DETECTED
Tetrahydrocannabinol: NOT DETECTED

## 2016-12-08 LAB — ETHANOL

## 2016-12-08 LAB — SALICYLATE LEVEL

## 2016-12-08 LAB — ACETAMINOPHEN LEVEL: Acetaminophen (Tylenol), Serum: <10 ug/mL — ABNORMAL LOW (ref 10–30)

## 2016-12-08 MED ORDER — NICOTINE 21 MG/24HR TD PT24
21.0000 mg | MEDICATED_PATCH | Freq: Every day | TRANSDERMAL | Status: DC
  Administered 2016-12-09: 21 mg via TRANSDERMAL
  Filled 2016-12-08: qty 1

## 2016-12-08 MED ORDER — ALUM & MAG HYDROXIDE-SIMETH 200-200-20 MG/5ML PO SUSP: 30.0000 mL | ORAL | Status: DC | PRN

## 2016-12-08 MED ORDER — HYDROXYZINE HCL 25 MG PO TABS: 50.0000 mg | ORAL_TABLET | Freq: Three times a day (TID) | ORAL | Status: DC | PRN

## 2016-12-08 MED ORDER — ONDANSETRON HCL 4 MG PO TABS: 4.0000 mg | ORAL_TABLET | Freq: Three times a day (TID) | ORAL | Status: DC | PRN

## 2016-12-08 NOTE — ED Notes (Signed)
Patient escorted to shower for privacy to perform safety check for contraband. Patient educated about search process and term "contraband " and routine search performed.

## 2016-12-08 NOTE — ED Notes (Signed)
Blood draw attempt x1 unsuccessful.

## 2016-12-08 NOTE — ED Notes (Signed)
No respiratory or acute distress noted alert and oriented no pain voiced.

## 2016-12-08 NOTE — ED Provider Notes (Signed)
[facility]-EMERGENCY DEPT Provider Note   CSN: [id number] Arrival date & time: 12/08/16  1709     History   Chief Complaint Chief Complaint  Patient presents with  . Suicidal    HPI Anthony [name] is a 56 y.o. male.  56 year old-male who presented to behavior health because of suicidal ideations with plan to step into traffic and the run over by a bus. States that he has had increased stress recently related to having an argument with his significant other. He denies any homicidal ideations.  Does have a history of major depressive disorder. Denies any history of suicide attempt in the past. His symptoms have been persistent nothing makes them better. After being seen by behavior health was recommended that he needs inpatient hospitalization.      Past Medical History:  Diagnosis Date  . Bell's palsy   . Depression   . Post traumatic stress disorder   . Seizures (HCC)    last seizure 12 yrs ago    Patient Active Problem List   Diagnosis Date Noted  . Allergic reaction   . Rash   . Suicidal ideation   . PTSD (post-traumatic stress disorder) 09/12/2014  . MDD (major depressive disorder), severe (HCC) 09/11/2014  . Episodic mood disorder (HCC) 09/11/2014    Past Surgical History:  Procedure Laterality Date  . APPENDECTOMY    . brain damage    . CHOLECYSTECTOMY    . FOOT SURGERY Bilateral   . KNEE SURGERY Right        Home Medications    Prior to Admission medications   Medication Sig Start Date End Date Taking? Authorizing Provider  carisoprodol (SOMA) 350 MG tablet Take 1 tablet (350 mg total) by mouth 4 (four) times daily as needed for muscle spasms. 11/25/16   Felicie Morn, NP  hydrOXYzine (ATARAX/VISTARIL) 50 MG tablet Take 50 mg by mouth 3 (three) times daily as needed for anxiety or itching.    Historical Provider, MD  naproxen (NAPROSYN) 500 MG tablet Take 1 tablet (500 mg total) by mouth 2 (two) times daily. 11/25/16   Felicie Morn, NP  QUEtiapine (SEROQUEL)  50 MG tablet Take 1 tablet (50 mg total) by mouth at bedtime as needed (sleep). Patient not taking: Reported on 10/20/2016 09/17/14   Thermon Leyland, NP  traZODone (DESYREL) 50 MG tablet Take 1 tablet (50 mg total) by mouth at bedtime and may repeat dose one time if needed. Patient not taking: Reported on 10/20/2016 09/17/14   Thermon Leyland, NP  venlafaxine XR (EFFEXOR-XR) 150 MG 24 hr capsule Take 150 mg by mouth daily with breakfast.    Historical Provider, MD    Family History No family history on file.  Social History Social History  Substance Use Topics  . Smoking status: Current Every Day Smoker    Packs/day: 1.00    Years: 38.00    Types: Cigarettes  . Smokeless tobacco: Never Used  . Alcohol use No     Comment: no etoh ffor 20     Allergies   Nuedexta [dextromethorphan-quinidine]   Review of Systems Review of Systems  All other systems reviewed and are negative.    Physical Exam Updated Vital Signs BP 108/93 (BP Location: Left Arm)   Pulse 74   Temp 98.4 F (36.9 C) (Oral)   Resp 18   SpO2 97%   Physical Exam  Constitutional: He is oriented to person, place, and time. He appears well-developed and well-nourished.  Non-toxic appearance. No distress.  HENT:  Head: Normocephalic and atraumatic.  Eyes: Conjunctivae, EOM and lids are normal. Pupils are equal, round, and reactive to light.  Neck: Normal range of motion. Neck supple. No tracheal deviation present. No thyroid mass present.  Cardiovascular: Normal rate, regular rhythm and normal heart sounds.  Exam reveals no gallop.   No murmur heard. Pulmonary/Chest: Effort normal and breath sounds normal. No stridor. No respiratory distress. He has no decreased breath sounds. He has no wheezes. He has no rhonchi. He has no rales.  Abdominal: Soft. Normal appearance and bowel sounds are normal. He exhibits no distension. There is no tenderness. There is no rebound and no CVA tenderness.  Musculoskeletal: Normal  range of motion. He exhibits no edema or tenderness.  Neurological: He is alert and oriented to person, place, and time. He has normal strength. No cranial nerve deficit or sensory deficit. GCS eye subscore is 4. GCS verbal subscore is 5. GCS motor subscore is 6.  Skin: Skin is warm and dry. No abrasion and no rash noted.  Psychiatric: He has a normal mood and affect. His speech is normal and behavior is normal. He expresses suicidal ideation. He expresses no homicidal ideation. He expresses suicidal plans. He expresses no homicidal plans.  Nursing note and vitals reviewed.    ED Treatments / Results  Labs (all labs ordered are listed, but only abnormal results are displayed) Labs Reviewed  CBC - Abnormal; Notable for the following:       Result Value   WBC 13.3 (*)    All other components within normal limits  RAPID URINE DRUG SCREEN, HOSP PERFORMED  COMPREHENSIVE METABOLIC PANEL  ETHANOL  SALICYLATE LEVEL  ACETAMINOPHEN LEVEL    EKG  EKG Interpretation None       Radiology No results found.  Procedures Procedures (including critical care time)  Medications Ordered in ED Medications - No data to display   Initial Impression / Assessment and Plan / ED Course  I have reviewed the triage vital signs and the nursing notes.  Pertinent labs & imaging results that were available during my care of the patient were reviewed by me and considered in my medical decision making (see chart for details).  Clinical Course     Patient is a preliminary medically cleared pending his labs which will be followed by me. He is stable to go to [facility] for disposition by psychiatry  Final Clinical Impressions(s) / ED Diagnoses   Final diagnoses:  None    New Prescriptions New Prescriptions   No medications on file     Lorre NickAnthony Ronneisha Jett, MD 12/08/16 1924

## 2016-12-08 NOTE — H&P (Signed)
Behavioral Health Medical Screening Exam  Anthony [name] is an 56 y.o. male.  Patient came in as a walk in.  States that his ex wife called him "a son of a bitch."  Afterwards, he wanted to jump infront of traffic.  States he lives in a group home.  He states he is on Depakote, Seroquel and Effexor but its not working as he is crying all the time.  States that even when he is watching football, he is crying.  He states that he last saw his provider 6 mos ago and he missed a recent appt.  He denies non compliance with medications.  He also reports that he was in the [employer] and has "PBA."  Total Time spent with patient: 30 minutes  Psychiatric Specialty Exam: Physical Exam  Nursing note and vitals reviewed.   ROS  There were no vitals taken for this visit.There is no height or weight on file to calculate BMI.  General Appearance: Casual  Eye Contact:  Good  Speech:  Clear and Coherent and Slurred at times  Volume:  Normal  Mood:  Anxious  Affect:  Appropriate  Thought Process:  Coherent  Orientation:  Full (Time, Place, and Person)  Thought Content:  Logical and Rumination  Suicidal Thoughts:  Yes.  with intent/plan  Homicidal Thoughts:  No  Memory:  Immediate;   Fair Recent;   Fair Remote;   Fair  Judgement:  Poor  Insight:  Lacking  Psychomotor Activity:  Normal  Concentration: Concentration: Poor and Attention Span: Poor  Recall:  Poor  Fund of Knowledge:Poor  Language: Poor  Akathisia:  No  Handed:  Right  AIMS (if indicated):     Assets:  Desire for Improvement Physical Health Social Support  Sleep:       Musculoskeletal: Strength & Muscle Tone: within normal limits Gait & Station: normal Patient leans: N/A  There were no vitals taken for this visit.  Recommendations:  Based on my evaluation the patient appears to have an emergency medical condition for which I recommend the patient be transferred to the emergency department for further evaluation.  Lindwood QuaSheila  [name], NP [facility] 12/08/2016, 4:43 PM

## 2016-12-08 NOTE — BH Assessment (Signed)
Assessment Note  Anthony [name] is a 56 y.o. male who presented as a voluntary walk-in to [facility] with complaint of suicidal ideation.  Pt was last assessed by TTS in November 2017 for suicidal ideation.  At that time, he was treated inpatient.  Pt provided history.  Pt reported that he has a history of Major Depressive Disorder (Severe), PTSD, and Cocaine Use Disorder as well as Cannabis Use Disorder.  He is currently enrolled in the [facility] program for drug treatment and also receives psychiatric services with them (prescribed Seroquel, Effexor, Depakote).  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  Pt reported that today he got into an argument with his ex-wife (she called him an SOB), and as a result, he became suicidal with plan to walk into traffic or step onto train tracks.  Pt also endorsed persistent and unremitting despondency, tearfulness, irritability, and feelings of worthlessness and hopelessness.  Pt stated that he has attempted suicide on two other occasions.  Pt denied homicidal ideation, auditory/visual hallucination, or self-injury.  Pt declined to plan for safety.  During assessment, Pt presented as alert and oriented.  He had good eye contact and was cooperative.  Pt's demeanor was calm.  Pt was dressed in street clothes and appeared appropriately groomed.  Pt's mood was reported as depressed, and affect was congruent.  Pt endorsed ongoing suicidal ideation with plan as well as other depressive symptoms (see above).  Pt also endorsed ongoing cannabis use (last use was 12/01/16) and past crack cocaine use (last use was reported to be three months ago).  Pt's speech was normal in rate, rhythm, and volume.  Pt's thought processes were within normal range, and thought content was logical and coherent.  There was no evidence of delusion.  Pt's recent memory was intact, and remote memory was impaired.  Pt's concentration was grossly intact.  Insight  and judgment were poor.  Impulse control was fair.  Consulted with Durene Fruits, NP who determined that Pt meets inpatient criteria.  Diagnosis: MDD, Recurrent Ep, Severe w/o Psychotic Symptoms; Cocaine Use D/O Partial Remission; Cannabis Use Disorder   Past Medical History:  Past Medical History:  Diagnosis Date  . Bell's palsy   . Depression   . Post traumatic stress disorder   . Seizures (HCC)    last seizure 12 yrs ago    Past Surgical History:  Procedure Laterality Date  . APPENDECTOMY    . brain damage    . CHOLECYSTECTOMY    . FOOT SURGERY Bilateral   . KNEE SURGERY Right     Family History: No family history on file.  Social History:  reports that he has been smoking Cigarettes.  He has a 38.00 pack-year smoking history. He has never used smokeless tobacco. He reports that he uses drugs, including Marijuana, about 2 times per week. He reports that he does not drink alcohol.  Additional Social History:  Alcohol / Drug Use Pain Medications: See PTA Prescriptions: See PTA Over the Counter: See PTA History of alcohol / drug use?: Yes Substance #1 Name of Substance 1: Marijuana 1 - Amount (size/oz): $20 worth 1 - Frequency: Every other day 1 - Duration: Ongoing 1 - Last Use / Amount: 12/01/16  CIWA:   COWS:    Allergies:  Allergies  Allergen Reactions  . Nuedexta [Dextromethorphan-Quinidine] Other (See Comments)    hallucinations    Home Medications:  (Not in a hospital admission)  OB/GYN Status:  No LMP for male patient.  General Assessment Data [facility] Assessment: In system Is this a Tele or Face-to-Face Assessment?: Face-to-Face Is this an Initial Assessment or a Re-assessment for this encounter?: Initial Assessment Marital status: Divorced Living Arrangements: Group Home Can pt return to current living arrangement?: Yes Admission Status: Voluntary Is patient capable of signing voluntary admission?:  Yes Referral Source: Self/Family/Friend Insurance type: [place]  Medical Screening Exam [facility] Walk[facility] ONLY) Medical Exam completed: Yes  Crisis Care Plan Living Arrangements: Group Home Name of Psychiatrist: [facility] Name of Therapist: [facility]  Education Status Is patient currently in school?: No  Risk to self with the past 6 months Suicidal Ideation: Yes-Currently Present Has patient been a risk to self within the past 6 months prior to admission? : Yes Suicidal Intent: Yes-Currently Present Has patient had any suicidal intent within the past 6 months prior to admission? : Yes Is patient at risk for suicide?: Yes Suicidal Plan?: Yes-Currently Present Has patient had any suicidal plan within the past 6 months prior to admission? : Yes Specify Current Suicidal Plan: Step into traffic, train Access to Means: Yes What has been your use of drugs/alcohol within the last 12 months?: Marijuana (Pt also has hx of crack cocaine use) Previous Attempts/Gestures: Yes How many times?: 2 Triggers for Past Attempts: Unknown ("I can't remember") Intentional Self Injurious Behavior: None Family Suicide History: Unknown Recent stressful life event(s): Conflict (Comment), Other (Comment) (Conflict with ex-wife) Persecutory voices/beliefs?: No Depression: Yes Depression Symptoms: Despondent, Insomnia, Tearfulness, Isolating, Loss of interest in usual pleasures, Feeling angry/irritable, Feeling worthless/self pity Substance abuse history and/or treatment for substance abuse?: Yes Suicide prevention information given to non-admitted patients: Not applicable  Risk to Others within the past 6 months Homicidal Ideation: No Does patient have any lifetime risk of violence toward others beyond the six months prior to admission? : No Thoughts of Harm to Others: No Current Homicidal Intent: No Current Homicidal Plan: No Access to Homicidal Means: No History of harm  to others?: No Assessment of Violence: None Noted Does patient have access to weapons?: No Criminal Charges Pending?: No Does patient have a court date: No Is patient on probation?: No  Psychosis Hallucinations: None noted Delusions: None noted  Mental Status Report Appearance/Hygiene: Unremarkable, Other (Comment) (Street clothes) Eye Contact: Fair Motor Activity: Unremarkable Speech: Logical/coherent, Unremarkable Level of Consciousness: Alert Mood: Depressed Affect: Appropriate to circumstance Anxiety Level: None Thought Processes: Coherent, Relevant Judgement: Impaired Orientation: Person, Place, Time, Situation Obsessive Compulsive Thoughts/Behaviors: None  Cognitive Functioning Concentration: Good Memory: Recent Intact, Remote Impaired IQ: Average Insight: Poor Impulse Control: Fair Appetite: Good Sleep: No Change Vegetative Symptoms: None  ADLScreening [facility] Assessment Services) Patient's cognitive ability adequate to safely complete daily activities?: Yes Patient able to express need for assistance with ADLs?: Yes Independently performs ADLs?: Yes (appropriate for developmental age)  Prior Inpatient Therapy Prior Inpatient Therapy: Yes Prior Therapy Dates: 2017 and others [facility] Reason for Treatment: SI, PTSD  Prior Outpatient Therapy Prior Outpatient Therapy: Yes Prior Therapy Dates: Ongoing [facility] Reason for Treatment: Substance use, mood Does patient have an ACCT team?: No Does patient have Intensive In-House Services?  : No Does patient have [facility] services? : No Does patient have P4CC services?: No  ADL Screening (condition at time of admission) Patient's cognitive ability adequate to safely complete daily activities?: Yes Is the patient  deaf or have difficulty hearing?: No Does the patient have difficulty seeing, even when wearing  glasses/contacts?: No Does the patient have difficulty concentrating, remembering, or making decisions?: No Patient able to express need for assistance with ADLs?: Yes Does the patient have difficulty dressing or bathing?: No Independently performs ADLs?: Yes (appropriate for developmental age) Does the patient have difficulty walking or climbing stairs?: No Weakness of Legs: None Weakness of Arms/Hands: None  Home Assistive Devices/Equipment Home Assistive Devices/Equipment: None  Therapy Consults (therapy consults require a physician order) PT Evaluation Needed: No OT Evalulation Needed: No SLP Evaluation Needed: No Abuse/Neglect Assessment (Assessment to be complete while patient is alone) Physical Abuse: Denies Verbal Abuse: Denies Sexual Abuse: Denies Exploitation of patient/patient's resources: Denies Self-Neglect: Denies Values / Beliefs Cultural Requests During Hospitalization: None Spiritual Requests During Hospitalization: None Consults Spiritual Care Consult Needed: No Social Work Consult Needed: No Merchant navy officer (For Healthcare) Does Patient Have a Medical Advance Directive?: Yes Type of Advance Directive: Living will Copy of Living Will in Chart?: No - copy requested    Additional Information 1:1 In Past 12 Months?: No CIRT Risk: No Elopement Risk: No Does patient have medical clearance?: Yes     Disposition:  Disposition Initial Assessment Completed for this Encounter: Yes Disposition of Patient: Inpatient treatment program Type of inpatient treatment program: Adult (Per May Agustin NP, Pt meets inpt criteria)  On Site Evaluation by:   Reviewed with Physician:    Dorris Fetch Taya Ashbaugh 12/08/2016 5:08 PM

## 2016-12-08 NOTE — ED Notes (Signed)
[facility] out in front of traffic. Patient denies HI. Patient states " now I don't want to hurt my wife anymore, I just want to kill myself". Patient denies AVH at this time. Patient states "I'm not schizo". Plan of care discussed and patient given a [place] sandwich and soda. High fall risk band placed on patient. Patient voices no complaints or concerns at this time. Encouragement and support provided and safety maintain. Q 15 min safety check in place and video monitoring.

## 2016-12-08 NOTE — ED Notes (Signed)
Pt has been seen and wand by security. 

## 2016-12-08 NOTE — ED Notes (Signed)
Unable to collect labs at this time patient is getting dress in scrubs

## 2016-12-08 NOTE — ED Triage Notes (Signed)
[facility] [place] with plan of stepping out in traffic or bus.  Patient reports arguing today with "his old lady" and around 3pm is when Si came about. Patient also reports having a "cold" and would like to be seen for those symptoms. Cough and runny nose.

## 2016-12-09 ENCOUNTER — Encounter (HOSPITAL_COMMUNITY): Payer: Self-pay | Admitting: *Deleted

## 2016-12-09 ENCOUNTER — Inpatient Hospital Stay (HOSPITAL_COMMUNITY)
Admission: AD | Admit: 2016-12-09 | Discharge: 2016-12-14 | DRG: 885 | Disposition: A | Discharge status: Home / Self Care | Payer: Medicare HMO | Source: Intra-hospital | Attending: Psychiatry | Admitting: Psychiatry

## 2016-12-09 DIAGNOSIS — Z79899 Other long term (current) drug therapy: Secondary | ICD-10-CM

## 2016-12-09 DIAGNOSIS — R45851 Suicidal ideations: Secondary | ICD-10-CM | POA: Diagnosis present

## 2016-12-09 DIAGNOSIS — F1210 Cannabis abuse, uncomplicated: Secondary | ICD-10-CM | POA: Diagnosis present

## 2016-12-09 DIAGNOSIS — T7840XA Allergy, unspecified, initial encounter: Secondary | ICD-10-CM | POA: Diagnosis present

## 2016-12-09 DIAGNOSIS — Z8661 Personal history of infections of the central nervous system: Secondary | ICD-10-CM | POA: Diagnosis not present

## 2016-12-09 DIAGNOSIS — F314 Bipolar disorder, current episode depressed, severe, without psychotic features: Secondary | ICD-10-CM | POA: Diagnosis present

## 2016-12-09 DIAGNOSIS — Z888 Allergy status to other drugs, medicaments and biological substances status: Secondary | ICD-10-CM

## 2016-12-09 DIAGNOSIS — F17210 Nicotine dependence, cigarettes, uncomplicated: Secondary | ICD-10-CM | POA: Diagnosis present

## 2016-12-09 DIAGNOSIS — F332 Major depressive disorder, recurrent severe without psychotic features: Principal | ICD-10-CM | POA: Diagnosis present

## 2016-12-09 DIAGNOSIS — Y929 Unspecified place or not applicable: Secondary | ICD-10-CM

## 2016-12-09 DIAGNOSIS — F4310 Post-traumatic stress disorder, unspecified: Secondary | ICD-10-CM | POA: Diagnosis present

## 2016-12-09 DIAGNOSIS — F1411 Cocaine abuse, in remission: Secondary | ICD-10-CM | POA: Diagnosis present

## 2016-12-09 DIAGNOSIS — Z818 Family history of other mental and behavioral disorders: Secondary | ICD-10-CM

## 2016-12-09 DIAGNOSIS — F251 Schizoaffective disorder, depressive type: Secondary | ICD-10-CM | POA: Diagnosis present

## 2016-12-09 DIAGNOSIS — R21 Rash and other nonspecific skin eruption: Secondary | ICD-10-CM | POA: Diagnosis present

## 2016-12-09 DIAGNOSIS — Z9049 Acquired absence of other specified parts of digestive tract: Secondary | ICD-10-CM

## 2016-12-09 DIAGNOSIS — F39 Unspecified mood [affective] disorder: Secondary | ICD-10-CM | POA: Diagnosis present

## 2016-12-09 DIAGNOSIS — Z9889 Other specified postprocedural states: Secondary | ICD-10-CM

## 2016-12-09 DIAGNOSIS — Z915 Personal history of self-harm: Secondary | ICD-10-CM

## 2016-12-09 DIAGNOSIS — Z639 Problem related to primary support group, unspecified: Secondary | ICD-10-CM | POA: Diagnosis not present

## 2016-12-09 DIAGNOSIS — Z811 Family history of alcohol abuse and dependence: Secondary | ICD-10-CM

## 2016-12-09 DIAGNOSIS — Z8249 Family history of ischemic heart disease and other diseases of the circulatory system: Secondary | ICD-10-CM | POA: Diagnosis not present

## 2016-12-09 DIAGNOSIS — Z808 Family history of malignant neoplasm of other organs or systems: Secondary | ICD-10-CM | POA: Diagnosis not present

## 2016-12-09 MED ORDER — GABAPENTIN 100 MG PO CAPS
200.0000 mg | ORAL_CAPSULE | Freq: Two times a day (BID) | ORAL | Status: DC
  Administered 2016-12-09: 200 mg via ORAL
  Filled 2016-12-09: qty 2

## 2016-12-09 MED ORDER — NICOTINE 21 MG/24HR TD PT24
21.0000 mg | MEDICATED_PATCH | Freq: Every day | TRANSDERMAL | Status: DC
  Administered 2016-12-10 – 2016-12-14 (×5): 21 mg via TRANSDERMAL
  Filled 2016-12-09 (×7): qty 1

## 2016-12-09 MED ORDER — TRAZODONE HCL 50 MG PO TABS: 50.0000 mg | ORAL_TABLET | Freq: Every evening | ORAL | Status: DC | PRN

## 2016-12-09 MED ORDER — ONDANSETRON HCL 4 MG PO TABS: 4.0000 mg | ORAL_TABLET | Freq: Three times a day (TID) | ORAL | Status: DC | PRN

## 2016-12-09 MED ORDER — MAGNESIUM HYDROXIDE 400 MG/5ML PO SUSP: 30.0000 mL | Freq: Every day | ORAL | Status: DC | PRN

## 2016-12-09 MED ORDER — HYDROXYZINE HCL 50 MG PO TABS
50.0000 mg | ORAL_TABLET | Freq: Three times a day (TID) | ORAL | Status: DC | PRN
  Administered 2016-12-09: 50 mg via ORAL
  Filled 2016-12-09: qty 1

## 2016-12-09 MED ORDER — QUETIAPINE FUMARATE 50 MG PO TABS: 50.0000 mg | ORAL_TABLET | Freq: Every evening | ORAL | Status: DC | PRN

## 2016-12-09 MED ORDER — GABAPENTIN 100 MG PO CAPS
200.0000 mg | ORAL_CAPSULE | Freq: Two times a day (BID) | ORAL | Status: DC
  Administered 2016-12-09 – 2016-12-14 (×11): 200 mg via ORAL
  Filled 2016-12-09 (×15): qty 2

## 2016-12-09 MED ORDER — QUETIAPINE FUMARATE 50 MG PO TABS: 50.0000 mg | ORAL_TABLET | Freq: Every day | ORAL | Status: DC

## 2016-12-09 MED ORDER — ACETAMINOPHEN 325 MG PO TABS
650.0000 mg | ORAL_TABLET | Freq: Four times a day (QID) | ORAL | Status: DC | PRN
  Administered 2016-12-09 – 2016-12-13 (×5): 650 mg via ORAL
  Filled 2016-12-09 (×5): qty 2

## 2016-12-09 MED ORDER — ALUM & MAG HYDROXIDE-SIMETH 200-200-20 MG/5ML PO SUSP: 30.0000 mL | ORAL | Status: DC | PRN

## 2016-12-09 MED ORDER — TRAZODONE HCL 50 MG PO TABS
50.0000 mg | ORAL_TABLET | Freq: Every evening | ORAL | Status: DC | PRN
Start: 2016-12-09 — End: 2016-12-14
  Administered 2016-12-09: 50 mg via ORAL
  Filled 2016-12-09: qty 1

## 2016-12-09 MED ORDER — QUETIAPINE FUMARATE 50 MG PO TABS
50.0000 mg | ORAL_TABLET | Freq: Every day | ORAL | Status: DC
  Administered 2016-12-09 – 2016-12-13 (×5): 50 mg via ORAL
  Filled 2016-12-09 (×8): qty 1

## 2016-12-09 MED ORDER — VENLAFAXINE HCL ER 75 MG PO CP24: 150.0000 mg | ORAL_CAPSULE | Freq: Every day | ORAL | Status: DC

## 2016-12-09 MED ORDER — VENLAFAXINE HCL ER 150 MG PO CP24
150.0000 mg | ORAL_CAPSULE | Freq: Every day | ORAL | Status: DC
  Administered 2016-12-10: 150 mg via ORAL
  Filled 2016-12-09 (×2): qty 1

## 2016-12-09 NOTE — Progress Notes (Signed)
12/09/16 1342:  LRT played a game of Keep It Going Volleyball with pt and two peers.  Pt arrived late.  Pt sat in chair during the activity.  Pt was very engaged and social during the activity.    Caroll RancherMarjette Caela Huot, LRT/CTRS

## 2016-12-09 NOTE — Consult Note (Signed)
[facility] Consult   Reason for Consult:  Suicidal ideations with a plan to walk in traffic Referring Physician:  EDP Patient Identification: Anthony [name] MRN:  [id number] Principal Diagnosis: Major depressive disorder, recurrent severe without psychotic features [facility]) Diagnosis:   Patient Active Problem List   Diagnosis Date Noted  . Major depressive disorder, recurrent severe without psychotic features ([place]) [F33.2] 12/09/2016    Priority: High  . PTSD (post-traumatic stress disorder) [F43.10] 09/12/2014    Priority: High  . Allergic reaction [T78.40XA]   . Rash [R21]   . Suicidal ideation [R45.851]   . Episodic mood disorder (Anthony [name]) [F39] 09/11/2014    Total Time spent with patient: 45 minutes  Subjective:   Anthony [name] is a 56 y.o. male patient states, "I'm depressed as hell."  HPI:  56 yo male who presents to the ED with depression and suicidal ideations with plan to walk into traffic.  He is not getting along with his wife and upset about not getting his disability check yet.  Stressed, hopeless, helpless, and worthless feelings expressed.  No homicidal ideations or substance abuse besides cannabis last week.  Past Psychiatric History: depression, PTSD  Risk to Self: Is patient at risk for suicide?: Yes Risk to Others:   Prior Inpatient Therapy:   Prior Outpatient Therapy:    Past Medical History:  Past Medical History:  Diagnosis Date  . Bell's palsy   . Depression   . Post traumatic stress disorder   . Seizures (Anthony [name])    last seizure 12 yrs ago    Past Surgical History:  Procedure Laterality Date  . APPENDECTOMY    . brain damage    . CHOLECYSTECTOMY    . FOOT SURGERY Bilateral   . KNEE SURGERY Right    Family History: No family history on file. Family Psychiatric  History: none Social History:  History  Alcohol Use No    Comment: no etoh ffor 20     History  Drug Use  . Frequency: 2.0 times per week  . Types: Marijuana   Comment: Weekly use    Social History   Social History  . Marital status: Divorced    Spouse name: N/A  . Number of children: N/A  . Years of education: N/A   Social History Main Topics  . Smoking status: Current Every Day Smoker    Packs/day: 1.00    Years: 38.00    Types: Cigarettes  . Smokeless tobacco: Never Used  . Alcohol use No     Comment: no etoh ffor 20  . Drug use:     Frequency: 2.0 times per week    Types: Marijuana     Comment: Weekly use  . Sexual activity: Yes    Birth control/ protection: None   Other Topics Concern  . None   Social History Narrative  . None   Additional Social History:    Allergies:   Allergies  Allergen Reactions  . Nuedexta [Dextromethorphan-Quinidine] Other (See Comments)    hallucinations    Labs:  Results for orders placed or performed during the hospital encounter of 12/08/16 (from the past 48 hour(s))  Rapid urine drug screen (hospital performed)     Status: None   Collection Time: 12/08/16  6:18 PM  Result Value Ref Range   Opiates NONE DETECTED NONE DETECTED   Cocaine NONE DETECTED NONE DETECTED   Benzodiazepines NONE DETECTED NONE DETECTED   Amphetamines NONE DETECTED NONE DETECTED   Tetrahydrocannabinol NONE DETECTED NONE  DETECTED   Barbiturates NONE DETECTED NONE DETECTED    Comment:        DRUG SCREEN FOR MEDICAL PURPOSES ONLY.  IF CONFIRMATION IS NEEDED FOR ANY PURPOSE, NOTIFY LAB WITHIN 5 DAYS.        LOWEST DETECTABLE LIMITS FOR URINE DRUG SCREEN Drug Class       Cutoff (ng/mL) Amphetamine      1000 Barbiturate      200 Benzodiazepine   371 Tricyclics       696 Opiates          300 Cocaine          300 THC              50   Comprehensive metabolic panel     Status: Abnormal   Collection Time: 12/08/16  6:47 PM  Result Value Ref Range   Sodium 136 135 - 145 mmol/L   Potassium 4.0 3.5 - 5.1 mmol/L   Chloride 104 101 - 111 mmol/L   CO2 25 22 - 32 mmol/L   Glucose, Bld 98 65 - 99 mg/dL   BUN 22  (H) 6 - 20 mg/dL   Creatinine, Ser 0.77 0.61 - 1.24 mg/dL   Calcium 9.0 8.9 - 10.3 mg/dL   Total Protein 8.0 6.5 - 8.1 g/dL   Albumin 4.8 3.5 - 5.0 g/dL   AST 20 15 - 41 U/L   ALT 19 17 - 63 U/L   Alkaline Phosphatase 70 38 - 126 U/L   Total Bilirubin 0.6 0.3 - 1.2 mg/dL   GFR calc non Af Amer >60 >60 mL/min   GFR calc Af Amer >60 >60 mL/min    Comment: (NOTE) The eGFR has been calculated using the CKD EPI equation. This calculation has not been validated in all clinical situations. eGFR's persistently <60 mL/min signify possible Chronic Kidney Disease.    Anion gap 7 5 - 15  Ethanol     Status: None   Collection Time: 12/08/16  6:47 PM  Result Value Ref Range   Alcohol, Ethyl (B) <5 <5 mg/dL    Comment:        LOWEST DETECTABLE LIMIT FOR SERUM ALCOHOL IS 5 mg/dL FOR MEDICAL PURPOSES ONLY   Salicylate level     Status: None   Collection Time: 12/08/16  6:47 PM  Result Value Ref Range   Salicylate Lvl <7.8 2.8 - 30.0 mg/dL  Acetaminophen level     Status: Abnormal   Collection Time: 12/08/16  6:47 PM  Result Value Ref Range   Acetaminophen (Tylenol), Serum <10 (L) 10 - 30 ug/mL    Comment:        THERAPEUTIC CONCENTRATIONS VARY SIGNIFICANTLY. A RANGE OF 10-30 ug/mL MAY BE AN EFFECTIVE CONCENTRATION FOR MANY PATIENTS. HOWEVER, SOME ARE BEST TREATED AT CONCENTRATIONS OUTSIDE THIS RANGE. ACETAMINOPHEN CONCENTRATIONS >150 ug/mL AT 4 HOURS AFTER INGESTION AND >50 ug/mL AT 12 HOURS AFTER INGESTION ARE OFTEN ASSOCIATED WITH TOXIC REACTIONS.   cbc     Status: Abnormal   Collection Time: 12/08/16  6:47 PM  Result Value Ref Range   WBC 13.3 (H) 4.0 - 10.5 K/uL   RBC 5.18 4.22 - 5.81 MIL/uL   Hemoglobin 15.3 13.0 - 17.0 g/dL   HCT 44.0 39.0 - 52.0 %   MCV 84.9 78.0 - 100.0 fL   MCH 29.5 26.0 - 34.0 pg   MCHC 34.8 30.0 - 36.0 g/dL   RDW 14.3 11.5 - 15.5 %   Platelets 212  150 - 400 K/uL    Current Facility-Administered Medications  Medication Dose Route Frequency  Provider Last Rate Last Dose  . alum & mag hydroxide-simeth (MAALOX/MYLANTA) 200-200-20 MG/5ML suspension 30 mL  30 mL Oral PRN Lacretia Leigh, MD      . gabapentin (NEURONTIN) capsule 200 mg  200 mg Oral BID Corena Pilgrim, MD   200 mg at 12/09/16 1212  . hydrOXYzine (ATARAX/VISTARIL) tablet 50 mg  50 mg Oral TID PRN Lacretia Leigh, MD      . nicotine (NICODERM CQ - dosed in mg/24 hours) patch 21 mg  21 mg Transdermal Daily Lacretia Leigh, MD   21 mg at 12/09/16 0908  . ondansetron (ZOFRAN) tablet 4 mg  4 mg Oral Q8H PRN Lacretia Leigh, MD      . QUEtiapine (SEROQUEL) tablet 50 mg  50 mg Oral QHS Myana Schlup, MD      . traZODone (DESYREL) tablet 50 mg  50 mg Oral QHS PRN Corena Pilgrim, MD      . Derrill Memo ON 12/10/2016] venlafaxine XR (EFFEXOR-XR) 24 hr capsule 150 mg  150 mg Oral Q breakfast Corena Pilgrim, MD       Current Outpatient Prescriptions  Medication Sig Dispense Refill  . QUEtiapine (SEROQUEL) 50 MG tablet Take 1 tablet (50 mg total) by mouth at bedtime as needed (sleep). 30 tablet 0  . venlafaxine XR (EFFEXOR-XR) 150 MG 24 hr capsule Take 150 mg by mouth daily with breakfast.    . carisoprodol (SOMA) 350 MG tablet Take 1 tablet (350 mg total) by mouth 4 (four) times daily as needed for muscle spasms. (Patient not taking: Reported on 12/08/2016) 30 tablet 0  . naproxen (NAPROSYN) 500 MG tablet Take 1 tablet (500 mg total) by mouth 2 (two) times daily. (Patient not taking: Reported on 12/08/2016) 20 tablet 0  . traZODone (DESYREL) 50 MG tablet Take 1 tablet (50 mg total) by mouth at bedtime and may repeat dose one time if needed. (Patient not taking: Reported on 12/08/2016) 60 tablet 0    Musculoskeletal: Strength & Muscle Tone: within normal limits Gait & Station: normal Patient leans: N/A  Psychiatric Specialty Exam: Physical Exam  Constitutional: He is oriented to person, place, and time. He appears well-developed and well-nourished.  HENT:  Head: Normocephalic.  Neck: Normal  range of motion.  Respiratory: Effort normal.  Musculoskeletal: Normal range of motion.  Neurological: He is alert and oriented to person, place, and time.  Psychiatric: His speech is normal and behavior is normal. Judgment normal. Cognition and memory are normal. He exhibits a depressed mood. He expresses suicidal ideation. He expresses suicidal plans.    Review of Systems  Psychiatric/Behavioral: Positive for depression and suicidal ideas.  All other systems reviewed and are negative.   Blood pressure 108/64, pulse 66, temperature 98.8 F (37.1 C), temperature source Oral, resp. rate 20, SpO2 95 %.There is no height or weight on file to calculate BMI.  General Appearance: Casual  Eye Contact:  Good  Speech:  Normal Rate  Volume:  Normal  Mood:  Depressed and Hopeless  Affect:  Congruent  Thought Process:  Coherent and Descriptions of Associations: Intact  Orientation:  Full (Time, Place, and Person)  Thought Content:  WDL and Rumination  Suicidal Thoughts:  Yes.  with intent/plan  Homicidal Thoughts:  No  Memory:  Immediate;   Fair Recent;   Fair Remote;   Fair  Judgement:  Fair  Insight:  Fair  Psychomotor Activity:  Decreased  Concentration:  Concentration: Fair and Attention Span: Fair  Recall:  [employer] of Knowledge:  Fair  Language:  Good  Akathisia:  No  Handed:  Right  AIMS (if indicated):     Assets:  Housing Leisure Time Physical Health Resilience  ADL's:  Intact  Cognition:  WNL  Sleep:        Treatment Plan Summary: Daily contact with patient to assess and evaluate symptoms and progress in treatment, Medication management and Plan major depressive disorder, recurrent, severe without psychosis:  -Crisis stabilization -Medication management:  Continued medical medications along with Seroquel 50 mg at bedtime for mood, Gabapentin 200 mg BID for anxiety and mood, Vistaril 50 mg TID PRN anxiety, Trazodone 50 mg at bedtime for sleep, Effexor 150 mg daily for  depression. -Individual counseling  Disposition: Recommend psychiatric Inpatient admission when medically cleared.  Waylan Boga, NP 12/09/2016 12:26 PM  Patient seen face-to-face for psychiatric evaluation, chart reviewed and case discussed with the physician extender and developed treatment plan. Reviewed the information documented and agree with the treatment plan. Corena Pilgrim, MD

## 2016-12-09 NOTE — Plan of Care (Addendum)
Problem: Safety: Goal: Periods of time without injury will increase Outcome: Progressing Patient remains safe on the unit at this time. Patient contracts for safety and is on q15 minute safety checks. Patient is a high falls risk; fall safety reviewed with patient, staff aware. Patient able to express needs. Vital signs stable.

## 2016-12-09 NOTE — ED Notes (Signed)
Pt observed in day room playing with ball with recreational therapist.

## 2016-12-09 NOTE — ED Notes (Signed)
Pt presents with sad affect. Behavior cooperative. Pt reports depression;endorsing SI. Pt verbally contracts for safety. Pt denies HI. Encouragement and support provided. Special checks q 15 mins in place for safety. Video monitoring in place. Will continue to monitor.
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
## 2016-12-09 NOTE — Progress Notes (Signed)
Nursing Progress Note 7p-7a  D) Patient presents cooperative with pressured, rapid and at times tangential speech. Patient originally assigned to 401-01, but moved to room 500-02. AC and charge nurse notified. Patient presents with concrete thinking. Patient reports chronic neck pain of 5/10 and 7/10 feeling of general malaise. Patient states he may be "coming down with something". Patient also asks Clinical research associatewriter to "check and see if he still has an ear drum" and states "i think the hospital broke it, I can't hear". External ear assessed, WNL. Patient in no acute distress. Patient denies SI/HI or AVH at this time. Patient contracts for safety at this time.   A) Emotional support given. Patient medicated with PM orders as prescribed. Medications reviewed with patient. Patient on q15 min safety checks. Opportunities for questions or concerns presented to patient.  R) Patient receptive to interaction with nurse. Patient remains safe on the unit at this time. Patient is resting in bed without complaints. Will continue to monitor.

## 2016-12-09 NOTE — ED Notes (Signed)
[facility] on unit to [facility] per MD order. Pt signed for personal property and property given to [facility] for transfer. Pt signed e-signature. Ambulatory off unit .

## 2016-12-09 NOTE — ED Notes (Addendum)
Pt at bathroom door,pt observed  holding on to bathroom handle and lowering himself to the ground and then  Pt was observed sitting on the floor. Pt reports he did not fall that he sat on the ground, Pt denies injury.nursing staff reviewed security tape-reported no fall occurred/observed, This nurse notified Edie, RN charge nurse and Technical sales engineerLinsey RN AC . Will continue to monitor.

## 2016-12-09 NOTE — Tx Team (Signed)
Initial Treatment Plan 12/09/2016 8:33 PM [place] BingJeffrey Lubin WUJ:[id number]    PATIENT STRESSORS: Financial difficulties Marital or family conflict   PATIENT STRENGTHS: Barrister's clerkCommunication skills Motivation for treatment/growth   PATIENT IDENTIFIED PROBLEMS: At risk for suicide  Depression  "My mind racing"  "Getting money"               DISCHARGE CRITERIA:  Ability to meet basic life and health needs Adequate post-discharge living arrangements Improved stabilization in mood, thinking, and/or behavior Need for constant or close observation no longer present Verbal commitment to aftercare and medication compliance  PRELIMINARY DISCHARGE PLAN: Outpatient therapy Return to previous living arrangement  PATIENT/FAMILY INVOLVEMENT: This treatment plan has been presented to and reviewed with the patient, [place] BingJeffrey Robitaille.  The patient and family have been given the opportunity to ask questions and make suggestions.  Carleene OverlieMiddleton, Malkia Nippert P, RN 12/09/2016, 8:33 PM

## 2016-12-10 ENCOUNTER — Encounter (HOSPITAL_COMMUNITY): Payer: Self-pay | Admitting: Psychiatry

## 2016-12-10 DIAGNOSIS — Z79899 Other long term (current) drug therapy: Secondary | ICD-10-CM

## 2016-12-10 DIAGNOSIS — Z818 Family history of other mental and behavioral disorders: Secondary | ICD-10-CM

## 2016-12-10 DIAGNOSIS — Z8661 Personal history of infections of the central nervous system: Secondary | ICD-10-CM

## 2016-12-10 DIAGNOSIS — Z8249 Family history of ischemic heart disease and other diseases of the circulatory system: Secondary | ICD-10-CM

## 2016-12-10 DIAGNOSIS — Z9889 Other specified postprocedural states: Secondary | ICD-10-CM

## 2016-12-10 DIAGNOSIS — Z888 Allergy status to other drugs, medicaments and biological substances status: Secondary | ICD-10-CM

## 2016-12-10 DIAGNOSIS — Z9049 Acquired absence of other specified parts of digestive tract: Secondary | ICD-10-CM

## 2016-12-10 DIAGNOSIS — R45851 Suicidal ideations: Secondary | ICD-10-CM

## 2016-12-10 DIAGNOSIS — F332 Major depressive disorder, recurrent severe without psychotic features: Principal | ICD-10-CM

## 2016-12-10 DIAGNOSIS — Z808 Family history of malignant neoplasm of other organs or systems: Secondary | ICD-10-CM

## 2016-12-10 DIAGNOSIS — Z811 Family history of alcohol abuse and dependence: Secondary | ICD-10-CM

## 2016-12-10 MED ORDER — OLANZAPINE 5 MG PO TABS: 5.0000 mg | ORAL_TABLET | Freq: Three times a day (TID) | ORAL | Status: DC | PRN

## 2016-12-10 MED ORDER — GUAIFENESIN ER 600 MG PO TB12
600.0000 mg | ORAL_TABLET | Freq: Two times a day (BID) | ORAL | Status: DC
  Administered 2016-12-10 – 2016-12-14 (×9): 600 mg via ORAL
  Filled 2016-12-10 (×10): qty 1

## 2016-12-10 MED ORDER — VENLAFAXINE HCL ER 75 MG PO CP24
225.0000 mg | ORAL_CAPSULE | Freq: Every day | ORAL | Status: DC
  Administered 2016-12-11 – 2016-12-14 (×4): 225 mg via ORAL
  Filled 2016-12-10 (×6): qty 3

## 2016-12-10 MED ORDER — OLANZAPINE 10 MG IM SOLR: 5.0000 mg | Freq: Three times a day (TID) | INTRAMUSCULAR | Status: DC | PRN

## 2016-12-10 MED ORDER — FLUTICASONE PROPIONATE 50 MCG/ACT NA SUSP
1.0000 | Freq: Every day | NASAL | Status: DC
  Administered 2016-12-10 – 2016-12-14 (×5): 1 via NASAL
  Filled 2016-12-10: qty 16

## 2016-12-10 MED ORDER — LORATADINE 10 MG PO TABS
10.0000 mg | ORAL_TABLET | Freq: Every day | ORAL | Status: DC
  Administered 2016-12-10 – 2016-12-14 (×5): 10 mg via ORAL
  Filled 2016-12-10 (×8): qty 1

## 2016-12-10 NOTE — [facility] Group Notes (Signed)
Type of Therapy:  Group therapy  Participation Level:  Active  Participation Quality:  Attentive  Affect:  Flat  Cognitive:  Oriented  Insight:  Limited  Engagement in Therapy:  Limited  Modes of Intervention:  Discussion, Socialization  Summary of Progress/Problems: Anthony [name] was engaged throughout, and stayed entire time. When asked what hope was to him, Anthony [name] stated that he felt a little hopeless due to him still experiencing his depression. When asked what he was hopeful for today, Anthony [name] stated that he was hopeful to eat again later on today. Anthony [name] was somewhat intrusive, however others were able to ignore him and did not require any intervention. Anthony [name] was very engaged in the discussion with the Chaplain and often gave his fellow group members positive feedback on their contributions.   Chaplain was here to lead a group on themes of hope and courage.
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
## 2016-12-10 NOTE — Progress Notes (Signed)
Writer spoke with patient 1:1 in the dayroom. He had been lying down asleep earlier and was now watching the game on tv. He reports having had a good day. Writer informed him of medication scheduled and he requested pain medication for his shoulder. He reports that he was supposed to have surgery on it but didn't. He received tylenol for the pain and he reported that he was taking percocet at home. He currently denies si/hi/a/v hallucinations. Support given and safety maintained on unit with 15 min checks.
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
## 2016-12-10 NOTE — Tx Team (Signed)
Interdisciplinary Treatment and Diagnostic Plan Update  12/10/2016 Time of Session: 11:11 AM  Anthony [name] MRN: [id number]  Principal Diagnosis: Major depressive disorder, recurrent severe without psychotic features ([place])  Secondary Diagnoses: Active Problems:   Major depressive disorder, recurrent severe without psychotic features ([place])   Current Medications:  Current Facility-Administered Medications  Medication Dose Route Frequency Provider Last Rate Last Dose  . acetaminophen (TYLENOL) tablet 650 mg  650 mg Oral Q6H PRN Patrecia Pour, NP   650 mg at 12/09/16 2229  . alum & mag hydroxide-simeth (MAALOX/MYLANTA) 200-200-20 MG/5ML suspension 30 mL  30 mL Oral PRN Patrecia Pour, NP      . gabapentin (NEURONTIN) capsule 200 mg  200 mg Oral BID Patrecia Pour, NP   200 mg at 12/10/16 0816  . hydrOXYzine (ATARAX/VISTARIL) tablet 50 mg  50 mg Oral TID PRN Patrecia Pour, NP   50 mg at 12/09/16 2124  . magnesium hydroxide (MILK OF MAGNESIA) suspension 30 mL  30 mL Oral Daily PRN Patrecia Pour, NP      . nicotine (NICODERM CQ - dosed in mg/24 hours) patch 21 mg  21 mg Transdermal Daily Patrecia Pour, NP   21 mg at 12/10/16 0817  . ondansetron (ZOFRAN) tablet 4 mg  4 mg Oral Q8H PRN Patrecia Pour, NP      . QUEtiapine (SEROQUEL) tablet 50 mg  50 mg Oral QHS Patrecia Pour, NP   50 mg at 12/09/16 2204  . traZODone (DESYREL) tablet 50 mg  50 mg Oral QHS PRN Patrecia Pour, NP   50 mg at 12/09/16 2229  . venlafaxine XR (EFFEXOR-XR) 24 hr capsule 150 mg  150 mg Oral Q breakfast Patrecia Pour, NP   150 mg at 12/10/16 6767    PTA Medications: Prescriptions Prior to Admission  Medication Sig Dispense Refill Last Dose  . QUEtiapine (SEROQUEL) 50 MG tablet Take 1 tablet (50 mg total) by mouth at bedtime as needed (sleep). 30 tablet 0 12/07/2016 at Unknown time  . venlafaxine XR (EFFEXOR-XR) 150 MG 24 hr capsule Take 150 mg by mouth daily with breakfast.   12/08/2016 at Unknown time  . naproxen  (NAPROSYN) 500 MG tablet Take 1 tablet (500 mg total) by mouth 2 (two) times daily. (Patient not taking: Reported on 12/10/2016) 20 tablet 0 Not Taking at Unknown time  . traZODone (DESYREL) 50 MG tablet Take 1 tablet (50 mg total) by mouth at bedtime and may repeat dose one time if needed. (Patient not taking: Reported on 12/10/2016) 60 tablet 0 Not Taking at Unknown time    Treatment Modalities: Medication Management, Group therapy, Case management,  1 to 1 session with clinician, Psychoeducation, Recreational therapy.   Physician Treatment Plan for Primary Diagnosis: Major depressive disorder, recurrent severe without psychotic features ([place]) Long Term Goal(s): Improvement in symptoms so as ready for discharge  Short Term Goals: Ability to identify changes in lifestyle to reduce recurrence of condition will improve  Medication Management: Evaluate patient's response, side effects, and tolerance of medication regimen.  Therapeutic Interventions: 1 to 1 sessions, Unit Group sessions and Medication administration.  Evaluation of Outcomes: Not Met  Physician Treatment Plan for Secondary Diagnosis: Active Problems:   Major depressive disorder, recurrent severe without psychotic features ([place])   Long Term Goal(s): Improvement in symptoms so as ready for discharge  Short Term Goals: Ability to identify triggers associated with substance abuse/mental health issues will improve  Medication Management: Evaluate patient's  response, side effects, and tolerance of medication regimen.  Therapeutic Interventions: 1 to 1 sessions, Unit Group sessions and Medication administration.  Evaluation of Outcomes: Not Met   RN Treatment Plan for Primary Diagnosis: Major depressive disorder, recurrent severe without psychotic features ([place]) Long Term Goal(s): Knowledge of disease and therapeutic regimen to maintain health will improve  Short Term Goals: Ability to identify and develop effective coping  behaviors will improve and Compliance with prescribed medications will improve  Medication Management: RN will administer medications as ordered by provider, will assess and evaluate patient's response and provide education to patient for prescribed medication. RN will report any adverse and/or side effects to prescribing provider.  Therapeutic Interventions: 1 on 1 counseling sessions, Psychoeducation, Medication administration, Evaluate responses to treatment, Monitor vital signs and CBGs as ordered, Perform/monitor CIWA, COWS, AIMS and Fall Risk screenings as ordered, Perform wound care treatments as ordered.  Evaluation of Outcomes: Not Met   LCSW Treatment Plan for Primary Diagnosis: Major depressive disorder, recurrent severe without psychotic features ([place]) Long Term Goal(s): Safe transition to appropriate next level of care at discharge, Engage patient in therapeutic group addressing interpersonal concerns.  Short Term Goals: Engage patient in aftercare planning with referrals and resources and Increase skills for wellness and recovery  Therapeutic Interventions: Assess for all discharge needs, 1 to 1 time with Social worker, Explore available resources and support systems, Assess for adequacy in community support network, Educate family and significant other(s) on suicide prevention, Complete Psychosocial Assessment, Interpersonal group therapy.  Evaluation of Outcomes: Not Met   Progress in Treatment: Attending groups: Yes Participating in groups: Yes Taking medication as prescribed: Yes, MD continues to assess for medication changes as needed Toleration medication: Yes, no side effects reported at this time Family/Significant other contact made: Patient refused.  Patient understands diagnosis: Limited insight  Discussing patient identified problems/goals with staff: Yes Medical problems stabilized or resolved: Yes Denies suicidal/homicidal ideation: No  Issues/concerns per  patient self-inventory: None Other: N/A  New problem(s) identified: None identified at this time.   New Short Term/Long Term Goal(s): None identified at this time.   Discharge Plan or Barriers: Return back to [facility] (residential[facility] and follow up with their drug treatment and psychiatric services.   Reason for Continuation of Hospitalization: Anxiety  Depression Medication stabilization Suicidal ideation   Estimated Length of Stay: 3-5 days  Attendees: Patient: 12/10/2016  11:11 AM  Physician: Dr. Shea Evans 12/10/2016  11:11 AM  Nursing:  12/10/2016  11:11 AM  RN Care Manager: Lars Pinks 12/10/2016  11:11 AM  Social Worker: Ripley Fraise, LCSW 12/10/2016  11:11 AM  Recreational Therapist: Winfield Cunas 12/10/2016  11:11 AM  Other: Radonna Ricker, Social Work Intern  12/10/2016  11:11 AM  Other:  12/10/2016  11:11 AM  Other: 12/10/2016  11:11 AM    Scribe for Treatment Team: Radonna Ricker, Social Work Intern 12/10/2016 11:11 AM
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
## 2016-12-10 NOTE — H&P (Signed)
Psychiatric Admission Assessment Adult  Patient Identification: Anthony [name] MRN:  [id number] Date of Evaluation:  12/10/2016 Chief Complaint: Pt states " I wanted to kill myself.'   Principal Diagnosis: Major depressive disorder, recurrent severe without psychotic features ([place]) Diagnosis:   Patient Active Problem List   Diagnosis Date Noted  . 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 Major depressive disorder, recurrent severe without psychotic features ([place]) [F33.2] 12/09/2016  . Allergic reaction [T78.40XA]   . Rash [R21]   . Suicidal ideation [R45.851]   . PTSD (post-traumatic stress disorder) [F43.10] 09/12/2014  . Episodic mood disorder ([place]) [F39] 09/11/2014   History of Present Illness: Dametri [name] is a 56 y.o. caucasian male, who is separated , on SSD, who lives in [place] , has a hx of depression, PTSD and cocaine, cannabis abuse - presented with worsening  SI with plan to walk in traffic .   Per initial notes in EHR : "Pt reported that he has a history of Major Depressive Disorder (Severe), PTSD, and Cocaine Use Disorder as well as Cannabis Use Disorder.  He is currently enrolled in the [name] program for drug treatment and also receives psychiatric services with them (prescribed Seroquel, Effexor, Depakote).  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  Pt reported that today he got into an argument with his ex-wife (she called him an SOB), and as a result, he became suicidal with plan to walk into traffic or step onto train tracks.  Pt also endorsed persistent and unremitting despondency, tearfulness, irritability, and feelings of worthlessness and hopelessness.  Pt stated that he has attempted suicide on two other occasions.  Pt denied homicidal ideation, auditory/visual hallucination, or self-injury.  Pt declined to plan for safety.During assessment, Pt presented as alert and oriented.  He had good eye contact and was  cooperative.  Pt's demeanor was calm.  Pt was dressed in street clothes and appeared appropriately groomed.  Pt's mood was reported as depressed, and affect was congruent.  Pt endorsed ongoing suicidal ideation with plan as well as other depressive symptoms (see above).  Pt also endorsed ongoing cannabis use (last use was 12/01/16) and past crack cocaine use (last use was reported to be three months ago).  "   Patient seen and chart reviewed today .Discussed patient with treatment team. Patient today is seen as depressed, anxious - reports lack of concentration, low energy, SI with plan as well as sleep issues since the past few days . Pt reports he is going through a divorce , separated from his exwife since 5 months , that is a stressor. Pt also reports he used to be in [employer] , witnessed his buddy being killed. Pt reports he has nightmares if he does not take his  Seroquel.  Pt reports he also has a hx of cocaine abuse - last use as noted above - 3 months ago, cannabis abuse - on and off - is currently in [place] [facility] program.   Pt reports he is currently on effexor which is effective and is on seroquel which helps with sleep.   Associated Signs/Symptoms: Depression Symptoms:  depressed mood, insomnia, psychomotor retardation, fatigue, feelings of worthlessness/guilt, difficulty concentrating, hopelessness, impaired memory, (Hypo) Manic Symptoms:  Impulsivity, Irritable Mood, Anxiety Symptoms:  Excessive Worry, Psychotic Symptoms:  denies PTSD Symptoms: Had a traumatic exposure:  see above Total Time spent with patient: 45 minutes  Past Psychiatric History: Pt reports hx of MDD, PTSD, substance abuse (  cocaine, cannabis) - [facility] , has a hx of IP admission to [facility], [facility], [facility], [facility]. Pt reports hx of suicide attempts x2 .  Is the patient at risk to self? Yes.    Has the patient been a risk to self in the past 6 months? No.  Has the patient been a  risk to self within the distant past? Yes.    Is the patient a risk to others? No.  Has the patient been a risk to others in the past 6 months? No.  Has the patient been a risk to others within the distant past? No.   Prior Inpatient Therapy:   Prior Outpatient Therapy:    Alcohol Screening: 1. How often do you have a drink containing alcohol?: Never 9. Have you or someone else been injured as a result of your drinking?: No 10. Has a relative or friend or a doctor or another health worker been concerned about your drinking or suggested you cut down?: No Alcohol Use Disorder Identification Test Final Score (AUDIT): 0 Brief Intervention: AUDIT score less than 7 or less-screening does not suggest unhealthy drinking-brief intervention not indicated Substance Abuse History in the last 12 months:  Yes.   Consequences of Substance Abuse: Medical Consequences:  IP admissions Family Consequences:  relational issues Previous Psychotropic Medications: Yes buspar, celexa, klonopin, lexapro Psychological Evaluations: No  Past Medical History:  Past Medical History:  Diagnosis Date  . Bell's palsy   . Depression   . Post traumatic stress disorder   . Seizures ([place])    last seizure 12 yrs ago    Past Surgical History:  Procedure Laterality Date  . APPENDECTOMY    . brain damage    . CHOLECYSTECTOMY    . FOOT SURGERY Bilateral   . KNEE SURGERY Right    Family History:  Family History  Problem Relation Age of Onset  . Cancer Mother   . Heart attack Father   . Mental illness Sister   . Alcoholism Brother    Family Psychiatric  History: Please see above Tobacco Screening: Have you used any form of tobacco in the last 30 days? (Cigarettes, Smokeless Tobacco, Cigars, and/or Pipes): Yes Tobacco use, Select all that apply: 5 or more cigarettes per day Are you interested in Tobacco Cessation Medications?: Yes, will notify MD for an order Counseled patient on smoking cessation including  recognizing danger situations, developing coping skills and basic information about quitting provided: Yes Social History: Pt is separated , is on SSD, used to be in [employer] ( [place]) discharged with honor is on SSD, reports he does not get VA benefits. History  Alcohol Use No    Comment: no etoh ffor 20     History  Drug Use  . Frequency: 2.0 times per week  . Types: Marijuana    Comment: Weekly use    Additional Social History: Marital status: Separated Separated, when?: 5 months ago What types of issues is patient dealing with in the relationship?: Patient reported him cheating on and arguing with his ex wife are the main issues in their relationship.  Additional relationship information: N/A Are you sexually active?: Yes What is your sexual orientation?: Heterosexual  Has your sexual activity been affected by drugs, alcohol, medication, or emotional stress?: No  Does patient have children?: No                         Allergies:  Allergies  Allergen Reactions  . Nuedexta [Dextromethorphan-Quinidine] Other (See Comments)    hallucinations   Lab Results:  Results for orders placed or performed during the hospital encounter of 12/08/16 (from the past 48 hour(s))  Rapid urine drug screen (hospital performed)     Status: None   Collection Time: 12/08/16  6:18 PM  Result Value Ref Range   Opiates NONE DETECTED NONE DETECTED   Cocaine NONE DETECTED NONE DETECTED   Benzodiazepines NONE DETECTED NONE DETECTED   Amphetamines NONE DETECTED NONE DETECTED   Tetrahydrocannabinol NONE DETECTED NONE DETECTED   Barbiturates NONE DETECTED NONE DETECTED    Comment:        DRUG SCREEN FOR MEDICAL PURPOSES ONLY.  IF CONFIRMATION IS NEEDED FOR ANY PURPOSE, NOTIFY LAB WITHIN 5 DAYS.        LOWEST DETECTABLE LIMITS FOR URINE DRUG SCREEN Drug Class       Cutoff (ng/mL) Amphetamine      1000 Barbiturate      200 Benzodiazepine   196 Tricyclics       222 Opiates           300 Cocaine          300 THC              50   Comprehensive metabolic panel     Status: Abnormal   Collection Time: 12/08/16  6:47 PM  Result Value Ref Range   Sodium 136 135 - 145 mmol/L   Potassium 4.0 3.5 - 5.1 mmol/L   Chloride 104 101 - 111 mmol/L   CO2 25 22 - 32 mmol/L   Glucose, Bld 98 65 - 99 mg/dL   BUN 22 (H) 6 - 20 mg/dL   Creatinine, Ser 0.77 0.61 - 1.24 mg/dL   Calcium 9.0 8.9 - 10.3 mg/dL   Total Protein 8.0 6.5 - 8.1 g/dL   Albumin 4.8 3.5 - 5.0 g/dL   AST 20 15 - 41 U/L   ALT 19 17 - 63 U/L   Alkaline Phosphatase 70 38 - 126 U/L   Total Bilirubin 0.6 0.3 - 1.2 mg/dL   GFR calc non Af Amer >60 >60 mL/min   GFR calc Af Amer >60 >60 mL/min    Comment: (NOTE) The eGFR has been calculated using the CKD EPI equation. This calculation has not been validated in all clinical situations. eGFR's persistently <60 mL/min signify possible Chronic Kidney Disease.    Anion gap 7 5 - 15  Ethanol     Status: None   Collection Time: 12/08/16  6:47 PM  Result Value Ref Range   Alcohol, Ethyl (B) <5 <5 mg/dL    Comment:        LOWEST DETECTABLE LIMIT FOR SERUM ALCOHOL IS 5 mg/dL FOR MEDICAL PURPOSES ONLY   Salicylate level     Status: None   Collection Time: 12/08/16  6:47 PM  Result Value Ref Range   Salicylate Lvl <9.7 2.8 - 30.0 mg/dL  Acetaminophen level     Status: Abnormal   Collection Time: 12/08/16  6:47 PM  Result Value Ref Range   Acetaminophen (Tylenol), Serum <10 (L) 10 - 30 ug/mL    Comment:        THERAPEUTIC CONCENTRATIONS VARY SIGNIFICANTLY. A RANGE OF 10-30 ug/mL MAY BE AN EFFECTIVE CONCENTRATION FOR MANY PATIENTS. HOWEVER, SOME ARE BEST TREATED AT CONCENTRATIONS OUTSIDE THIS RANGE. ACETAMINOPHEN CONCENTRATIONS >150 ug/mL AT 4 HOURS AFTER INGESTION AND >50 ug/mL AT 12 HOURS AFTER INGESTION  ARE OFTEN ASSOCIATED WITH TOXIC REACTIONS.   cbc     Status: Abnormal   Collection Time: 12/08/16  6:47 PM  Result Value Ref Range   WBC 13.3 (H) 4.0  - 10.5 K/uL   RBC 5.18 4.22 - 5.81 MIL/uL   Hemoglobin 15.3 13.0 - 17.0 g/dL   HCT 44.0 39.0 - 52.0 %   MCV 84.9 78.0 - 100.0 fL   MCH 29.5 26.0 - 34.0 pg   MCHC 34.8 30.0 - 36.0 g/dL   RDW 14.3 11.5 - 15.5 %   Platelets 212 150 - 400 K/uL    Blood Alcohol level:  Lab Results  Component Value Date   ETH <5 12/08/2016   ETH <5 44/96/7591    Metabolic Disorder Labs:  No results found for: HGBA1C, MPG No results found for: PROLACTIN No results found for: CHOL, TRIG, HDL, CHOLHDL, VLDL, LDLCALC  Current Medications: Current Facility-Administered Medications  Medication Dose Route Frequency Provider Last Rate Last Dose  . acetaminophen (TYLENOL) tablet 650 mg  650 mg Oral Q6H PRN Patrecia Pour, NP   650 mg at 12/09/16 2229  . alum & mag hydroxide-simeth (MAALOX/MYLANTA) 200-200-20 MG/5ML suspension 30 mL  30 mL Oral PRN Patrecia Pour, NP      . gabapentin (NEURONTIN) capsule 200 mg  200 mg Oral BID Patrecia Pour, NP   200 mg at 12/10/16 0816  . hydrOXYzine (ATARAX/VISTARIL) tablet 50 mg  50 mg Oral TID PRN Patrecia Pour, NP   50 mg at 12/09/16 2124  . magnesium hydroxide (MILK OF MAGNESIA) suspension 30 mL  30 mL Oral Daily PRN Patrecia Pour, NP      . nicotine (NICODERM CQ - dosed in mg/24 hours) patch 21 mg  21 mg Transdermal Daily Patrecia Pour, NP   21 mg at 12/10/16 0817  . OLANZapine (ZYPREXA) tablet 5 mg  5 mg Oral TID PRN Ursula Alert, MD       Or  . OLANZapine (ZYPREXA) injection 5 mg  5 mg Intramuscular TID PRN Ursula Alert, MD      . ondansetron (ZOFRAN) tablet 4 mg  4 mg Oral Q8H PRN Patrecia Pour, NP      . QUEtiapine (SEROQUEL) tablet 50 mg  50 mg Oral QHS Patrecia Pour, NP   50 mg at 12/09/16 2204  . traZODone (DESYREL) tablet 50 mg  50 mg Oral QHS PRN Patrecia Pour, NP   50 mg at 12/09/16 2229  . [START ON 12/11/2016] venlafaxine XR (EFFEXOR-XR) 24 hr capsule 225 mg  225 mg Oral Q breakfast Ursula Alert, MD       PTA Medications: Prescriptions Prior  to Admission  Medication Sig Dispense Refill Last Dose  . QUEtiapine (SEROQUEL) 50 MG tablet Take 1 tablet (50 mg total) by mouth at bedtime as needed (sleep). 30 tablet 0 12/07/2016 at Unknown time  . venlafaxine XR (EFFEXOR-XR) 150 MG 24 hr capsule Take 150 mg by mouth daily with breakfast.   12/08/2016 at Unknown time  . carisoprodol (SOMA) 350 MG tablet TAKE ONE TABLET BY MOUTH FOUR TIMES DAILY AS NEEDED FOR MUSCLE SPASMS  0   . naproxen (NAPROSYN) 500 MG tablet Take 1 tablet (500 mg total) by mouth 2 (two) times daily. (Patient not taking: Reported on 12/10/2016) 20 tablet 0 Not Taking at Unknown time  . traZODone (DESYREL) 50 MG tablet Take 1 tablet (50 mg total) by mouth at bedtime and may repeat dose one  time if needed. (Patient not taking: Reported on 12/10/2016) 60 tablet 0 Not Taking at Unknown time    Musculoskeletal: Strength & Muscle Tone: within normal limits Gait & Station: normal Patient leans: N/A  Psychiatric Specialty Exam: Physical Exam  Review of Systems  Psychiatric/Behavioral: Positive for depression, substance abuse and suicidal ideas. The patient is nervous/anxious and has insomnia.   All other systems reviewed and are negative.   Blood pressure 125/88, pulse (!) 102, temperature 98.5 F (36.9 C), temperature source Oral, resp. rate 18, height '5\' 9"'$  (1.753 m), weight 94.3 kg (208 lb).Body mass index is 30.72 kg/m.  General Appearance: Disheveled  Eye Contact:  Fair  Speech:  Clear and Coherent  Volume:  Normal  Mood:  Anxious, Depressed and Dysphoric  Affect:  Depressed  Thought Process:  Goal Directed and Descriptions of Associations: Circumstantial  Orientation:  Full (Time, Place, and Person)  Thought Content:  Rumination  Suicidal Thoughts:  Yes.  with intent/plan  Homicidal Thoughts:  No  Memory:  Immediate;   Fair Recent;   Fair Remote;   Fair  Judgement:  Impaired  Insight:  Shallow  Psychomotor Activity:  Normal  Concentration:  Concentration: Fair  and Attention Span: Fair  Recall:  [employer] of Knowledge:  Fair  Language:  Fair  Akathisia:  No  Handed:  Right  AIMS (if indicated):     Assets:  Communication Skills Desire for Improvement  ADL's:  Intact  Cognition:  WNL  Sleep:  Number of Hours: 6.5    Treatment Plan Summary:Patient today is seen as depressed, anxious - will readjust medications and continue to observe on the unit.  Major depressive disorder, recurrent severe without psychotic features ([place]) unstable  Will continue today 12/10/16 plan as below except where it is noted.   Daily contact with patient to assess and evaluate symptoms and progress in treatment and Medication management Patient will benefit from inpatient treatment and stabilization.   Estimated length of stay is 5-7 days.   Reviewed past medical records,treatment plan.   For Depression: Increase Effexor XR to 225 mg po daily.  For PTSD sx: Effexor XR 225 mg po daily. Seroquel 50 mg po qhs - helps with nightmares per patient.  For insomnia: Seroquel 50 mg po qhs.  For anxiety sx: Gabapentin 200 mg po tid.  Will continue to monitor vitals ,medication compliance and treatment side effects while patient is here.   Will monitor for medical issues as well as call consult as needed.   Reviewed labs cbc - wbc- elevated , cmp - wnl , uds - negative ,will order tsh, lipid panel, hba1c, pl.  Will get EKG for qtc monitoring.  CSW will start working on disposition.   Patient to participate in therapeutic milieu .      Observation Level/Precautions:  15 minute checks    Psychotherapy:  Individual and group therapy     Consultations:  CSW  Discharge Concerns:  Stability and safety       Physician Treatment Plan for Primary Diagnosis: Major depressive disorder, recurrent severe without psychotic features ([place]) Long Term Goal(s): Improvement in symptoms so as ready for discharge  Short Term Goals: Ability to identify changes in  lifestyle to reduce recurrence of condition will improve and Ability to identify triggers associated with substance abuse/mental health issues will improve  Physician Treatment Plan for Secondary Diagnosis: Principal Problem:   Major depressive disorder, recurrent severe without psychotic features ([place]) Active Problems:   PTSD (post-traumatic  stress disorder)   H/O encephalitis  Long Term Goal(s): Improvement in symptoms so as ready for discharge  Short Term Goals: Ability to identify changes in lifestyle to reduce recurrence of condition will improve and Ability to identify triggers associated with substance abuse/mental health issues will improve  I certify that inpatient services furnished can reasonably be expected to improve the patient's condition.    Ursula Alert, MD 1/12/20181:51 PM

## 2016-12-10 NOTE — Progress Notes (Signed)
Recreation Therapy Notes  Date: 12/10/16 Time: 1000 Location: 500 Hall Dayroom  Group Topic: Coping Skills  Goal Area(s) Addresses:  Patients will be able to identify positive coping skills. Patients will be able to identify benefits of coping skills. Patients will be able to identify benefits of using coping skills post d/c.  Behavioral Response: Engaged  Intervention:  Magazines, scissors, glue sticks, coping skills worksheet  Activity: My Coping Skills.  Patients were given a worksheet that was divided into 5 categories.  The categories were diversions, social, cognitive, tension releasers and physical.  Patients were to look through the magazines and find pictures of coping skills that fit each category.  Education: PharmacologistCoping Skills, Building control surveyorDischarge Planning.   Education Outcome: Acknowledges understanding/In group clarification offered/Needs additional education.   Clinical Observations/Feedback:  Pt got his materials to complete his sheet but left early with social worker and returned and the end of group.   Caroll RancherMarjette Carlisia Geno, LRT/CTRS         Lillia AbedLindsay, Claramae Rigdon A 12/10/2016 11:59 AM

## 2016-12-10 NOTE — [facility] Suicide Risk Assessment (Signed)
[facility] INPATIENT:  Family/Significant Other Suicide Prevention Education  Suicide Prevention Education:  Patient Refusal for Family/Significant Other Suicide Prevention Education: The patient Seneca BingJeffrey Avera has refused to provide written consent for family/significant other to be provided Family/Significant Other Suicide Prevention Education during admission and/or prior to discharge.  Physician notified.  Baldo DaubJolan Zaelyn Barbary 12/10/2016, 11:10 AM

## 2016-12-11 DIAGNOSIS — F17210 Nicotine dependence, cigarettes, uncomplicated: Secondary | ICD-10-CM

## 2016-12-11 LAB — LIPID PANEL
Cholesterol: 153 mg/dL (ref 0–200)
HDL: 69 mg/dL (ref >40)
LDL Cholesterol: 69 mg/dL (ref 0–99)
TRIGLYCERIDES: 77 mg/dL (ref <150)
Total CHOL/HDL Ratio: 2.2 RATIO
VLDL: 15 mg/dL (ref 0–40)

## 2016-12-11 LAB — TSH: TSH: 2.926 u[IU]/mL (ref 0.350–4.500)

## 2016-12-11 NOTE — Progress Notes (Signed)
D:  Per pt self inventory pt reports sleeping fair, appetite fair, energy level low, ability to pay attention low, rates depression at a 7 out of 10, hopelessness at a 5 out of 10, anxiety at a 7 out of 10, denies SI/HI/AVH, goal today: "talk to people", depressed, anxious during interaction.     A:  Emotional support provided, Encouraged pt to continue with treatment plan and attend all group activities, q15 min checks maintained for safety.  R:  Pt is receptive, going to groups, can be intrusive but is otherwise pleasant and cooperative with staff and other patients on the unit.

## 2016-12-11 NOTE — Progress Notes (Signed)
1:1 Note-Pt sitting in dayroom, ate lunch, no complaints at this time, 1:1 observation continued for patient safety, alert and oriented, denies pain, will continue to monitor patient.

## 2016-12-11 NOTE — Progress Notes (Signed)
1:1 Nursing note- Patient is currently sitting in the dayroom watching the football game talking with his sitter. He talked to Clinical research associatewriter about how he fell earlier today. Writer re educated him on fall safety and making sure his brakes are locked on his wheelchair before trying to transfer and having a staff member at his side before attempting to stand. He reports that his depression has lifted and he feels much better.He denies si/hi/a/v hallucinations. 1:1 continues and patient is safe.

## 2016-12-11 NOTE — Progress Notes (Signed)
1:1 Note-Pt went to cafeteria and ate dinner--per MD order pt can go to cafeteria for meals as long as he uses the wheelchair, has been compliant with wheelchair use, per 1:1 order pt is to be on close observation when using wheelchair and 1:1 when ambulating, denies SI/HI/AVH at this time, pt remains safe.

## 2016-12-11 NOTE — Progress Notes (Signed)
1:1 Note- 1:1 observation initiated post fall for patient safety/high fall risk, pt refuses to use a wheel chair and ambulates with an unsteady gate, no complaints at this time, 1:1 initiated and maintained for patient safety.

## 2016-12-11 NOTE — [facility] Group Notes (Signed)
Adult Psychoeducational Group Note  Date:  12/11/2016 Time:  1000am  Group Topic/Focus:  Goals Group:   The focus of this group is to help patients establish daily goals to achieve during treatment and discuss how the patient can incorporate goal setting into their daily lives to aide in recovery.   Participation Level:  Active  Participation Quality:  Monopolizing, Redirectable and Sharing  Affect:  Anxious and Excited  Cognitive:  Alert and Appropriate  Insight: Improving  Engagement in Group:  Distracting and Monopolizing  Modes of Intervention:  Discussion, Education, Limit-setting, Problem-solving and Support  Additional Comments:  Pt participated in group, needed some redirection because he was off topic at times and tends to monopolize the group at times, pt is easily redirectable.  Anthony [name] Anthony [name] 12/11/2016, 3:25 PM

## 2016-12-11 NOTE — Progress Notes (Signed)
[facility]  12/11/2016 12:31 PM [place] BingJeffrey Bhatt  MRN:  [id number] Subjective:  Patient reports " I am feeling okay today"  Objective:  [place] BingJeffrey Frank is awake, alert and oriented*3. Seen standing in the hall interacting with peers. Denies suicidal or homicidal ideation. Denies auditory or visual hallucination and does not appear to be responding to internal stimuli.  Patient denies depression or depressive symptoms, however patient reports  " I am sad all the time." Patient reports  interacting well with staff and others. Patient reports he is medication compliant without mediation side effects. Reports good appetite and resting well. Support, encouragement and reassurance was provided.   Principal Problem: Major depressive disorder, recurrent severe without psychotic features (HCC) Diagnosis:   Patient Active Problem List   Diagnosis Date Noted  . 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 Major depressive disorder, recurrent severe without psychotic features (HCC) [F33.2] 12/09/2016  . Allergic reaction [T78.40XA]   . Rash [R21]   . Suicidal ideation [R45.851]   . PTSD (post-traumatic stress disorder) [F43.10] 09/12/2014  . 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 Bell's palsy   . Depression   . Post traumatic stress disorder   . Seizures (HCC)    last seizure 12 yrs ago    Past Surgical History:  Procedure Laterality Date  . APPENDECTOMY    . brain damage    . CHOLECYSTECTOMY    . FOOT SURGERY Bilateral   . KNEE SURGERY Right    Family History:  Family History  Problem Relation Age of Onset  . Cancer Mother   . Heart attack Father   . Mental illness Sister   . Alcoholism Brother    Family Psychiatric  History:  Social History:  History  Alcohol Use No    Comment: no etoh ffor 22     History  Drug Use   . Frequency: 2.0 times per week  . Types: Marijuana    Comment: Weekly use    Social History   Social History  . Marital status: Divorced    Spouse name: N/A  . Number of children: N/A  . Years of education: N/A   Social History Main Topics  . Smoking status: Current Every Day Smoker    Packs/day: 1.00    Years: 38.00    Types: Cigarettes  . Smokeless tobacco: Never Used  . Alcohol use No     Comment: no etoh ffor 22  . Drug use:     Frequency: 2.0 times per week    Types: Marijuana     Comment: Weekly use  . Sexual activity: Yes    Birth control/ protection: None   Other Topics Concern  . None   Social History Narrative  . None   Additional Social History:                         Sleep: Fair  Appetite:  Fair  Current Medications: Current Facility-Administered Medications  Medication Dose Route Frequency Provider Last Rate Last Dose  . acetaminophen (TYLENOL) tablet 650 mg  650 mg Oral Q6H PRN Charm RingsJamison Y Lord, NP   650 mg at 12/10/16 2222  . alum & mag hydroxide-simeth (MAALOX/MYLANTA) 200-200-20 MG/5ML suspension 30 mL  30 mL Oral PRN Charm RingsJamison Y Lord, NP      .  fluticasone (FLONASE) 50 MCG/ACT nasal spray 1 spray  1 spray Each Nare Daily Sanjuana Kava, NP   1 spray at 12/11/16 0811  . gabapentin (NEURONTIN) capsule 200 mg  200 mg Oral BID Charm Rings, NP   200 mg at 12/11/16 3295  . guaiFENesin (MUCINEX) 12 hr tablet 600 mg  600 mg Oral BID Sanjuana Kava, NP   600 mg at 12/11/16 1884  . hydrOXYzine (ATARAX/VISTARIL) tablet 50 mg  50 mg Oral TID PRN Charm Rings, NP   50 mg at 12/09/16 2124  . loratadine (CLARITIN) tablet 10 mg  10 mg Oral Daily Sanjuana Kava, NP   10 mg at 12/11/16 0811  . magnesium hydroxide (MILK OF MAGNESIA) suspension 30 mL  30 mL Oral Daily PRN Charm Rings, NP      . nicotine (NICODERM CQ - dosed in mg/24 hours) patch 21 mg  21 mg Transdermal Daily Charm Rings, NP   21 mg at 12/11/16 1660  . OLANZapine (ZYPREXA) tablet 5  mg  5 mg Oral TID PRN Jomarie Longs, MD       Or  . OLANZapine (ZYPREXA) injection 5 mg  5 mg Intramuscular TID PRN Jomarie Longs, MD      . ondansetron (ZOFRAN) tablet 4 mg  4 mg Oral Q8H PRN Charm Rings, NP      . QUEtiapine (SEROQUEL) tablet 50 mg  50 mg Oral QHS Charm Rings, NP   50 mg at 12/10/16 2222  . traZODone (DESYREL) tablet 50 mg  50 mg Oral QHS PRN Charm Rings, NP   50 mg at 12/09/16 2229  . venlafaxine XR (EFFEXOR-XR) 24 hr capsule 225 mg  225 mg Oral Q breakfast Jomarie Longs, MD   225 mg at 12/11/16 6301    Lab Results:  Results for orders placed or performed during the hospital encounter of 12/09/16 (from the past 48 hour(s))  TSH     Status: None   Collection Time: 12/11/16  6:38 AM  Result Value Ref Range   TSH 2.926 0.350 - 4.500 uIU/mL    Comment: Performed by a 3rd Generation assay with a functional sensitivity of <=0.01 uIU/mL. Performed at [facility]     Blood Alcohol level:  Lab Results  Component Value Date   [facility] <5 12/08/2016   ETH <5 10/20/2016    Metabolic Disorder Labs: No results found for: HGBA1C, MPG No results found for: PROLACTIN No results found for: CHOL, TRIG, HDL, CHOLHDL, VLDL, LDLCALC  Physical Findings: AIMS: Facial and Oral Movements Muscles of Facial Expression: None, normal Lips and Perioral Area: None, normal Jaw: None, normal Tongue: None, normal,Extremity Movements Upper (arms, wrists, hands, fingers): None, normal Lower (legs, knees, ankles, toes): None, normal, Trunk Movements Neck, shoulders, hips: None, normal, Overall Severity Severity of abnormal movements (highest score from questions above): None, normal Incapacitation due to abnormal movements: None, normal Patient's awareness of abnormal movements (rate only patient's report): No Awareness, Dental Status Current problems with teeth and/or dentures?: Yes Does patient usually wear dentures?: No  CIWA:    COWS:      Musculoskeletal: Strength & Muscle Tone: within normal limits Gait & Station: unsteady Patient leans: Right and Left  Psychiatric Specialty Exam: Physical Exam  Nursing note and vitals reviewed. Constitutional: He is oriented to person, place, and time. He appears well-developed.  Neurological: He is alert and oriented to person, place, and time.  Psychiatric: He has a  normal mood and affect. His behavior is normal.    Review of Systems  Psychiatric/Behavioral: Positive for depression. The patient is nervous/anxious.     Blood pressure 120/79, pulse 96, temperature 98.3 F (36.8 C), resp. rate 16, height 5\' 9"  (1.753 m), weight 94.3 kg (208 lb).Body mass index is 30.72 kg/m.  General Appearance: Disheveled  Eye Contact:  Good  Speech:  Clear and Coherent  Volume:  Normal  Mood:  Anxious and Depressed  Affect:  Congruent  Thought Process:  Coherent  Orientation:  Full (Time, Place, and Person)  Thought Content:  Hallucinations: None  Suicidal Thoughts:  No  Homicidal Thoughts:  No  Memory:  Immediate;   Fair Recent;   Fair Remote;   Fair  Judgement:  Fair  Insight:  Fair  Psychomotor Activity:  Normal  Concentration:  Concentration: Fair  Recall:  [employer] of Knowledge:  Fair  Language:  Fair  Akathisia:  No  Handed:  Right  AIMS (if indicated):     Assets:  Communication Skills Physical Health Resilience Social Support  ADL's:  Intact  Cognition:  WNL  Sleep:  Number of Hours: 5.25     I agree with current treatment plan on 12/11/2016, Patient seen face-to-face for psychiatric evaluation follow-up, chart reviewed. Reviewed the information documented and agree with the treatment plan.  Treatment Plan Summary: Daily contact with patient to assess and evaluate symptoms and progress in treatment and Medication management  Major depressive disorder, recurrent severe without psychotic features (HCC) unstable  Orders placed for 1:1 for safety  Patient will  benefit from inpatient treatment and stabilization.   Estimated length of stay is 5-7 days.   Reviewed past medical records,treatment plan.   For Depression: Increase Effexor XR to 225 mg po daily.  For PTSD sx: Effexor XR 225 mg po daily.  Continue Seroquel 50 mg po qhs - helps with nightmares per patient.  For insomnia: Continue Seroquel 50 mg po qhs.  For anxiety sx: Gabapentin 200 mg po tid.  Will continue to monitor vitals ,medication compliance and treatment side effects while patient is here.   Will monitor for medical issues as well as call consult as needed.   Reviewed labs cbc - wbc- elevated , cmp - wnl , uds - negative ,will order tsh, lipid panel, hba1c, pl.  Will get EKG for qtc monitoring.  CSW will start working on disposition.   Patient to participate in therapeutic milieu .    Oneta Rack, NP 12/11/2016, 12:31 PM   Agree with NP Progress Note as above

## 2016-12-11 NOTE — Progress Notes (Signed)
Pt fell while in consult with nurse practitioner, fall was witnessed by the nurse practitioner, pt did not hit his head, no injury noted, pt alert and oriented, encouraged patient and offered a wheelchair, pt refused to use wheelchair, NP notified, new order for 1:1 observation for high fall risk safety. Pt verbalized understanding of fall prevention measures, Charge RN and [facility] notified.

## 2016-12-11 NOTE — [facility] Group Notes (Signed)
[facility] Group Notes:  (Clinical Social Work)  12/11/2016  11:15-12:00PM  Summary of Progress/Problems:   Today's process group involved patients discussing their feelings related to being hospitalized, as well as benefits they see to being in the hospital.  The group then discussed ways they can stay out of the hospital by pursuing their wellness. The patient expressed a primary feeling about being hospitalized is "not good, I don't want to be here."  He talked about using golf as a way to keep himself well.  He did not like the idea of using a pillbox for his medications, although he readily admitted staying on his medications is a challenge for him.  He stated he has nobody to help him with the pillbox, but thinks he could perhaps fill it himself at the beginning of every week.  The patient blew his nose continually throughout group, then took his saliva and "cleaned" sugar off the table next to him, continuing even after CSW asked him to stop and told him it was unsanitary.  He dozed off at one point, and when he woke up was a little more oriented and able to ask another patient pertinent questions.  Type of Therapy:  Group Therapy - Process  Participation Level:  Active  Participation Quality:  Attentive, Drowsy, Sharing and Supportive  Affect:  Blunted  Cognitive:  Disorganized and Lacking  Insight:  Improving  Engagement in Therapy:  Developing/Improving  Modes of Intervention:  Exploration, Discussion  Ambrose MantleMareida Grossman-Orr, LCSW 12/11/2016, 12:27 PM

## 2016-12-12 LAB — HEMOGLOBIN A1C
Hgb A1c MFr Bld: 5.7 % — ABNORMAL HIGH (ref 4.8–5.6)
MEAN PLASMA GLUCOSE: 117 mg/dL

## 2016-12-12 LAB — PROLACTIN: Prolactin: 15.5 ng/mL — ABNORMAL HIGH (ref 4.0–15.2)

## 2016-12-12 NOTE — [facility] Group Notes (Signed)
[facility] Group Notes:  (Nursing/MHT/Case Management/Adjunct)  Date:  12/12/2016  Time:  9:57 AM  Type of Therapy:  Psychoeducational Skills  Participation Level:  Active  Participation Quality:  Appropriate  Affect:  Appropriate  Cognitive:  Appropriate  Insight:  Appropriate  Engagement in Group:  Engaged  Modes of Intervention:  Discussion  Summary of Progress/Problems: Pt did attend self inventory group.      Jacquelyne BalintForrest, Jahna Liebert Shanta 12/12/2016, 9:57 AM

## 2016-12-12 NOTE — Progress Notes (Signed)
[facility]  12/12/2016 9:39 AM Anthony [name]  MRN:  [id number] Subjective:  Patient reports " I am feeling okay today"  Objective:  [name] is awake, alert and oriented*3. Patient was placed on 1:1 for safety. Seen resting in dayroom interacting with peers. Denies suicidal or homicidal ideation. Denies auditory or visual hallucination and does not appear to be responding to internal stimuli.  Patient denies depression during this assessment. Patient is aggravated with Recruitment consultantsafety sitter. Patient reports he is able to care for his self. Patient reports he is medication compliant without mediation side effects. Reports good appetite and states he is resting well. Support, encouragement and reassurance was provided.   Principal Problem: Major depressive disorder, recurrent severe without psychotic features (HCC) Diagnosis:   Patient Active Problem List   Diagnosis Date Noted  . 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 Major depressive disorder, recurrent severe without psychotic features (HCC) [F33.2] 12/09/2016  . Allergic reaction [T78.40XA]   . Rash [R21]   . Suicidal ideation [R45.851]   . PTSD (post-traumatic stress disorder) [F43.10] 09/12/2014  . 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 Bell's palsy   . Depression   . Post traumatic stress disorder   . Seizures (HCC)    last seizure 12 yrs ago    Past Surgical History:  Procedure Laterality Date  . APPENDECTOMY    . brain damage    . CHOLECYSTECTOMY    . FOOT SURGERY Bilateral   . KNEE SURGERY Right    Family History:  Family History  Problem Relation Age of Onset  . Cancer Mother   . Heart attack Father   . Mental illness Sister   . Alcoholism Brother    Family Psychiatric  History:  Social History:  History  Alcohol Use No    Comment: no etoh  ffor 22     History  Drug Use  . Frequency: 2.0 times per week  . Types: Marijuana    Comment: Weekly use    Social History   Social History  . Marital status: Divorced    Spouse name: N/A  . Number of children: N/A  . Years of education: N/A   Social History Main Topics  . Smoking status: Current Every Day Smoker    Packs/day: 1.00    Years: 38.00    Types: Cigarettes  . Smokeless tobacco: Never Used  . Alcohol use No     Comment: no etoh ffor 22  . Drug use:     Frequency: 2.0 times per week    Types: Marijuana     Comment: Weekly use  . Sexual activity: Yes    Birth control/ protection: None   Other Topics Concern  . None   Social History Narrative  . None   Additional Social History:                         Sleep: Fair  Appetite:  Fair  Current Medications: Current Facility-Administered Medications  Medication Dose Route Frequency Provider Last Rate Last Dose  . acetaminophen (TYLENOL) tablet 650 mg  650 mg Oral Q6H PRN Charm RingsJamison Y Lord, NP   650 mg at 12/11/16 2240  . alum & mag hydroxide-simeth (MAALOX/MYLANTA) 200-200-20 MG/5ML suspension 30 mL  30 mL Oral PRN Charm RingsJamison Y Lord, NP      .  fluticasone (FLONASE) 50 MCG/ACT nasal spray 1 spray  1 spray Each Nare Daily Sanjuana Kava, NP   1 spray at 12/12/16 0912  . gabapentin (NEURONTIN) capsule 200 mg  200 mg Oral BID Charm Rings, NP   200 mg at 12/12/16 0910  . guaiFENesin (MUCINEX) 12 hr tablet 600 mg  600 mg Oral BID Sanjuana Kava, NP   600 mg at 12/12/16 0912  . hydrOXYzine (ATARAX/VISTARIL) tablet 50 mg  50 mg Oral TID PRN Charm Rings, NP   50 mg at 12/09/16 2124  . loratadine (CLARITIN) tablet 10 mg  10 mg Oral Daily Sanjuana Kava, NP   10 mg at 12/12/16 0911  . magnesium hydroxide (MILK OF MAGNESIA) suspension 30 mL  30 mL Oral Daily PRN Charm Rings, NP      . nicotine (NICODERM CQ - dosed in mg/24 hours) patch 21 mg  21 mg Transdermal Daily Charm Rings, NP   21 mg at 12/12/16 0912   . OLANZapine (ZYPREXA) tablet 5 mg  5 mg Oral TID PRN Jomarie Longs, MD       Or  . OLANZapine (ZYPREXA) injection 5 mg  5 mg Intramuscular TID PRN Jomarie Longs, MD      . ondansetron (ZOFRAN) tablet 4 mg  4 mg Oral Q8H PRN Charm Rings, NP      . QUEtiapine (SEROQUEL) tablet 50 mg  50 mg Oral QHS Charm Rings, NP   50 mg at 12/11/16 2240  . traZODone (DESYREL) tablet 50 mg  50 mg Oral QHS PRN Charm Rings, NP   50 mg at 12/09/16 2229  . venlafaxine XR (EFFEXOR-XR) 24 hr capsule 225 mg  225 mg Oral Q breakfast Jomarie Longs, MD   225 mg at 12/12/16 0911    Lab Results:  Results for orders placed or performed during the hospital encounter of 12/09/16 (from the past 48 hour(s))  TSH     Status: None   Collection Time: 12/11/16  6:38 AM  Result Value Ref Range   TSH 2.926 0.350 - 4.500 uIU/mL    Comment: Performed by a 3rd Generation assay with a functional sensitivity of <=0.01 uIU/mL. Performed at [facility]   Lipid panel     Status: None   Collection Time: 12/11/16  6:38 AM  Result Value Ref Range   Cholesterol 153 0 - 200 mg/dL   Triglycerides 77 <161 mg/dL   HDL 69 >09 mg/dL   Total CHOL/HDL Ratio 2.2 RATIO   VLDL 15 0 - 40 mg/dL   LDL Cholesterol 69 0 - 99 mg/dL    Comment:        Total Cholesterol/HDL:CHD Risk Coronary Heart Disease Risk Table                     Men   Women  1/2 Average Risk   3.4   3.3  Average Risk       5.0   4.4  2 X Average Risk   9.6   7.1  3 X Average Risk  23.4   11.0        Use the calculated Patient Ratio above and the CHD Risk Table to determine the patient's CHD Risk.        ATP III CLASSIFICATION (LDL):  <100     mg/dL   Optimal  604-540  mg/dL   Near or Above  Optimal  130-159  mg/dL   Borderline  161-096  mg/dL   High  >045     mg/dL   Very High [facility]   Prolactin     Status: Abnormal   Collection Time: 12/11/16  6:38 AM  Result Value Ref Range    Prolactin 15.5 (H) 4.0 - 15.2 ng/mL    Comment: (NOTE) Performed At: [facility] [address] [place], [place] [id number] Mila Homer MD NW:[id number] Performed at [facility]     Blood Alcohol level:  Lab Results  Component Value Date   [facility] <5 12/08/2016   ETH <5 10/20/2016    Metabolic Disorder Labs: No results found for: HGBA1C, MPG Lab Results  Component Value Date   PROLACTIN 15.5 (H) 12/11/2016   Lab Results  Component Value Date   CHOL 153 12/11/2016   TRIG 77 12/11/2016   HDL 69 12/11/2016   CHOLHDL 2.2 12/11/2016   VLDL 15 12/11/2016   LDLCALC 69 12/11/2016    Physical Findings: AIMS: Facial and Oral Movements Muscles of Facial Expression: None, normal Lips and Perioral Area: None, normal Jaw: None, normal Tongue: None, normal,Extremity Movements Upper (arms, wrists, hands, fingers): None, normal Lower (legs, knees, ankles, toes): None, normal, Trunk Movements Neck, shoulders, hips: None, normal, Overall Severity Severity of abnormal movements (highest score from questions above): None, normal Incapacitation due to abnormal movements: None, normal Patient's awareness of abnormal movements (rate only patient's report): No Awareness, Dental Status Current problems with teeth and/or dentures?: Yes Does patient usually wear dentures?: No  CIWA:    COWS:     Musculoskeletal: Strength & Muscle Tone: within normal limits Gait & Station: unsteady Patient leans: Right and Left  Psychiatric Specialty Exam: Physical Exam  Nursing note and vitals reviewed. Constitutional: He is oriented to person, place, and time. He appears well-developed.  Neurological: He is alert and oriented to person, place, and time.  Psychiatric: He has a normal mood and affect. His behavior is normal.    Review of Systems  Psychiatric/Behavioral: Positive for depression. The patient is nervous/anxious.     Blood pressure (!) 124/58, pulse 85,  temperature 98.2 F (36.8 C), temperature source Oral, resp. rate 18, height 5\' 9"  (1.753 m), weight 94.3 kg (208 lb), SpO2 100 %.Body mass index is 30.72 kg/m.  General Appearance: Disheveled improving  Eye Contact:  Good  Speech:  Clear and Coherent  Volume:  Normal  Mood:  Anxious and Depressed  Affect:  Congruent  Thought Process:  Coherent  Orientation:  Full (Time, Place, and Person)  Thought Content:  Hallucinations: None  Suicidal Thoughts:  No  Homicidal Thoughts:  No  Memory:  Immediate;   Fair Recent;   Fair Remote;   Fair  Judgement:  Fair  Insight:  Fair  Psychomotor Activity:  Normal  Concentration:  Concentration: Fair  Recall:  [employer] of Knowledge:  Fair  Language:  Fair  Akathisia:  No  Handed:  Right  AIMS (if indicated):     Assets:  Communication Skills Physical Health Resilience Social Support  ADL's:  Intact  Cognition:  WNL  Sleep:  Number of Hours: 6.5     I agree with current treatment plan on 12/13/2016, Patient seen face-to-face for psychiatric evaluation follow-up, chart reviewed. Reviewed the information documented and agree with the treatment plan.  Treatment Plan Summary: Daily contact with patient to assess and evaluate symptoms and progress in treatment and Medication management  Major depressive disorder, recurrent severe without psychotic features (HCC) unstable  Orders placed for 1:1 for safety  Patient will benefit from inpatient treatment and stabilization.   Estimated length of stay is 5-7 days.   Reviewed past medical records,treatment plan.   For Depression: Increase Effexor XR to 225 mg po daily.  For PTSD sx: Effexor XR 225 mg po daily.  Continue Seroquel 50 mg po qhs - helps with nightmares per patient.  For insomnia: Continue Seroquel 50 mg po qhs.  For anxiety sx: Gabapentin 200 mg po tid.  Will continue to monitor vitals ,medication compliance and treatment side effects while patient is here.    Will monitor for medical issues as well as call consult as needed.   Reviewed labs cbc - wbc- elevated , cmp - wnl , uds - negative ,will order tsh, lipid panel, hba1c, pl.  Will get EKG for qtc monitoring.  CSW will start working on disposition.   Patient to participate in therapeutic milieu .    Oneta Rack, NP 12/12/2016, 9:39 AM   Agree with NP Progress Note as above

## 2016-12-12 NOTE — [facility] Group Notes (Signed)
[facility] Group Notes:  (Clinical Social Work)  12/12/2016  11:00AM-12:00PM  Summary of Progress/Problems:  The main focus of today's process group was to listen to a variety of genres of music and to identify that different types of music provoke different responses.  The patient then was able to identify personally what was soothing for them, as well as energizing, as well as how patient can personally use this knowledge in sleep habits, with depression, and with other symptoms.  The patient expressed at the beginning of group the overall feeling of "terrible/angry and depressed."  He stated his anger was at a "5" out of 10 while his depression was at a "3" out of 10.  At the end of group he stated he had "absolutely no anger or depression left."  Type of Therapy:  Music Therapy   Participation Level:  Active  Participation Quality:  Attentive and Sharing  Affect:  Blunted  Cognitive:  Disorganized  Insight:  Engaged  Engagement in Therapy:  Engaged  Modes of Intervention:   Activity, Exploration  Ambrose MantleMareida Grossman-Orr, LCSW 12/12/2016

## 2016-12-12 NOTE — Progress Notes (Signed)
Patient ID: Anthony [name], male   DOB: 1961/05/06, 56 y.o.   MRN: [id number]  1:1 notes  12/12/2016 @ 2200  D: Patient sitting in dayroom watching TV and interacting with peers and sitter. Pt reports he had a good day. Pt stated he is strong and does not need to be in wheelchair and 1:1. Pt denies SI/HI/AVH. No sign of physical distress noted at this time A: Pt encouraged to use wheelchair for his safety and will be evaluated by provider tomorrow. Support and encouragement offered as needed.1:1 observation for safety. R: Patient remains safe on the unit. Pt asked to take medication at bedtime. 1:1 continues for safety.

## 2016-12-12 NOTE — Progress Notes (Addendum)
  NURSING 1:1 NOTE   D: Pt on the unit very blunted, and continues to be very disrespectful to staff and peers. Pt has also been very demanding, and very intrusive on the unit. Pt continues to be very unsteady on his feet and has also been incontinent of urine. Pt has been requested to come of 1:1, patient made aware that he was going to have to remain on a 1:1 due to being unsteady on his feet. Pt reported that his depression was a 7, his hopelessness was a 7, and his anxiety was a 7. Pt reported that his goal for today was to come off 1:1. A: 1:1 continued for patients safety. R: Pts safety maintained.

## 2016-12-12 NOTE — Progress Notes (Signed)
Adult Psychoeducational Group Note  Date:  12/12/2016 Time:  8:52 PM  Group Topic/Focus:  Wrap-Up Group:   The focus of this group is to help patients review their daily goal of treatment and discuss progress on daily workbooks.   Participation Level:  Active  Participation Quality:  Appropriate and Attentive  Affect:  Appropriate  Cognitive:  Appropriate  Insight: Appropriate  Engagement in Group:  Engaged  Modes of Intervention:  Discussion  Additional Comments:  Pt stated he had a great day today. Pt was encouraged to make her needs known to staff. Caswell CorwinOwen, Aracelly Tencza C 12/12/2016, 8:52 PM

## 2016-12-12 NOTE — Progress Notes (Signed)
Nursing 1:1 Note  D: Pt in the bed resting, no physical distress noted. Pt will respond to the calling of his name, his vital signs are WNL. No issues or concerns noted. Pt remains a high fall risk due to him falling yesterday. A: 1:1 continued for patient safety. R: Pts safety maintained.

## 2016-12-12 NOTE — Progress Notes (Signed)
  NURSING 1:1 NOTE   D: Pt on the unit very blunted, and continues to be very disrespectful to staff and peers. Pt has also been very demanding, and very intrusive on the unit. Pt continues to be very unsteady on his feet, and continues to require frequent redirection when it come to remaining in wheelchair.  Pt reported that he was negative SI/HI, no AH/VH noted. A: 1:1 continued for patients safety. R: Pts safety maintained.

## 2016-12-12 NOTE — Progress Notes (Signed)
1:1 Nursing note-  Patient lying in bed asleep with eyes closed and respirations even, no distress noted. 1:1 continues and patient is safe.

## 2016-12-13 NOTE — Progress Notes (Signed)
1:1 Note: Patient maintained on constant supervision for safety.  Patient ambulatory on the unit without difficulty.  No behavioral issues noted.  Patient is visible in the dayroom with supervision.  Routine safety checks continues.  Patient safe on and off the unit.  Medication given as prescribed.  Support and encouragement offered as needed.

## 2016-12-13 NOTE — Progress Notes (Signed)
[facility] MD Progress Note  12/13/2016 12:57 PM Anthony [name]  MRN:  [id number]   Subjective:  Patient reports " I am doing okay, I am still feeling depressed and have suicidal thoughts."   Objective:  Anthony [name] is awake, alert and oriented*3. Patient was placed on 1:1 for safety. Seen resting in dayroom interacting with peers. Reports intermittent suicidal ideations. Patient denies  homicidal ideation. Denies auditory or visual hallucination and does not appear to be responding to internal stimuli. Patient is irritable and agitated regarding 1:1. Patient meet with social work and reports possible discharge for on 12/14/2016. Patient reports depression during this assessment, states his depression 7/10. Patient reports he is medication compliant without mediation side effects. Reports good appetite and states he is resting well. Support, encouragement and reassurance was provided.   Principal Problem: Major depressive disorder, recurrent severe without psychotic features (HCC) Diagnosis:   Patient Active Problem List   Diagnosis Date Noted  . 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 Major depressive disorder, recurrent severe without psychotic features (HCC) [F33.2] 12/09/2016  . Allergic reaction [T78.40XA]   . Rash [R21]   . Suicidal ideation [R45.851]   . PTSD (post-traumatic stress disorder) [F43.10] 09/12/2014  . 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 Bell's palsy   . Depression   . Post traumatic stress disorder   . Seizures (HCC)    last seizure 12 yrs ago    Past Surgical History:  Procedure Laterality Date  . APPENDECTOMY    . brain damage    . CHOLECYSTECTOMY    . FOOT SURGERY Bilateral   . KNEE SURGERY Right    Family History:  Family History  Problem Relation Age of Onset  . Cancer Mother   . Heart attack  Father   . Mental illness Sister   . Alcoholism Brother    Family Psychiatric  History:  Social History:  History  Alcohol Use No    Comment: no etoh ffor 22     History  Drug Use  . Frequency: 2.0 times per week  . Types: Marijuana    Comment: Weekly use    Social History   Social History  . Marital status: Divorced    Spouse name: N/A  . Number of children: N/A  . Years of education: N/A   Social History Main Topics  . Smoking status: Current Every Day Smoker    Packs/day: 1.00    Years: 38.00    Types: Cigarettes  . Smokeless tobacco: Never Used  . Alcohol use No     Comment: no etoh ffor 22  . Drug use:     Frequency: 2.0 times per week    Types: Marijuana     Comment: Weekly use  . Sexual activity: Yes    Birth control/ protection: None   Other Topics Concern  . None   Social History Narrative  . None   Additional Social History:                         Sleep: Fair  Appetite:  Fair  Current Medications: Current Facility-Administered Medications  Medication Dose Route Frequency Provider Last Rate Last Dose  . acetaminophen (TYLENOL) tablet 650 mg  650 mg Oral Q6H PRN Charm Rings, NP   650 mg at 12/12/16  2203  . alum & mag hydroxide-simeth (MAALOX/MYLANTA) 200-200-20 MG/5ML suspension 30 mL  30 mL Oral PRN Charm RingsJamison Y Lord, NP      . fluticasone (FLONASE) 50 MCG/ACT nasal spray 1 spray  1 spray Each Nare Daily Sanjuana KavaAgnes I Nwoko, NP   1 spray at 12/13/16 0804  . gabapentin (NEURONTIN) capsule 200 mg  200 mg Oral BID Charm RingsJamison Y Lord, NP   200 mg at 12/13/16 0804  . guaiFENesin (MUCINEX) 12 hr tablet 600 mg  600 mg Oral BID Sanjuana KavaAgnes I Nwoko, NP   600 mg at 12/13/16 0805  . hydrOXYzine (ATARAX/VISTARIL) tablet 50 mg  50 mg Oral TID PRN Charm RingsJamison Y Lord, NP   50 mg at 12/09/16 2124  . loratadine (CLARITIN) tablet 10 mg  10 mg Oral Daily Sanjuana KavaAgnes I Nwoko, NP   10 mg at 12/13/16 0804  . magnesium hydroxide (MILK OF MAGNESIA) suspension 30 mL  30 mL Oral Daily  PRN Charm RingsJamison Y Lord, NP      . nicotine (NICODERM CQ - dosed in mg/24 hours) patch 21 mg  21 mg Transdermal Daily Charm RingsJamison Y Lord, NP   21 mg at 12/13/16 0805  . OLANZapine (ZYPREXA) tablet 5 mg  5 mg Oral TID PRN Jomarie LongsSaramma Eappen, MD       Or  . OLANZapine (ZYPREXA) injection 5 mg  5 mg Intramuscular TID PRN Jomarie LongsSaramma Eappen, MD      . ondansetron (ZOFRAN) tablet 4 mg  4 mg Oral Q8H PRN Charm RingsJamison Y Lord, NP      . QUEtiapine (SEROQUEL) tablet 50 mg  50 mg Oral QHS Charm RingsJamison Y Lord, NP   50 mg at 12/12/16 2202  . traZODone (DESYREL) tablet 50 mg  50 mg Oral QHS PRN Charm RingsJamison Y Lord, NP   50 mg at 12/09/16 2229  . venlafaxine XR (EFFEXOR-XR) 24 hr capsule 225 mg  225 mg Oral Q breakfast Jomarie LongsSaramma Eappen, MD   225 mg at 12/13/16 [id number]    Lab Results:  No results found for this or any previous visit (from the past 48 hour(s)).  Blood Alcohol level:  Lab Results  Component Value Date   [facility]   ETH <5 10/20/2016    Metabolic Disorder Labs: Lab Results  Component Value Date   HGBA1C 5.7 (H) 12/11/2016   MPG 117 12/11/2016   Lab Results  Component Value Date   PROLACTIN 15.5 (H) 12/11/2016   Lab Results  Component Value Date   CHOL 153 12/11/2016   TRIG 77 12/11/2016   HDL 69 12/11/2016   CHOLHDL 2.2 12/11/2016   VLDL 15 12/11/2016   LDLCALC 69 12/11/2016    Physical Findings: AIMS: Facial and Oral Movements Muscles of Facial Expression: None, normal Lips and Perioral Area: None, normal Jaw: None, normal Tongue: None, normal,Extremity Movements Upper (arms, wrists, hands, fingers): None, normal Lower (legs, knees, ankles, toes): None, normal, Trunk Movements Neck, shoulders, hips: None, normal, Overall Severity Severity of abnormal movements (highest score from questions above): None, normal Incapacitation due to abnormal movements: None, normal Patient's awareness of abnormal movements (rate only patient's report): No Awareness, Dental Status Current problems with teeth  and/or dentures?: Yes Does patient usually wear dentures?: No  CIWA:    COWS:     Musculoskeletal: Strength & Muscle Tone: within normal limits Gait & Station: unsteady Patient leans: Right and Left  Psychiatric Specialty Exam: Physical Exam  Nursing note and vitals reviewed. Constitutional: He is oriented to person, place, and time.  He appears well-developed.  Neurological: He is alert and oriented to person, place, and time.  Psychiatric: He has a normal mood and affect. His behavior is normal.    Review of Systems  Psychiatric/Behavioral: Positive for depression. The patient is nervous/anxious.     Blood pressure 115/79, pulse 85, temperature 97.5 F (36.4 C), temperature source Oral, resp. rate 18, height 5\' 9"  (1.753 m), weight 94.3 kg (208 lb), SpO2 95 %.Body mass index is 30.72 kg/m.  General Appearance: Casual improving  Eye Contact:  Good  Speech:  Clear and Coherent  Volume:  Normal  Mood:  Anxious and Depressed  Affect:  Congruent  Thought Process:  Coherent  Orientation:  Full (Time, Place, and Person)  Thought Content:  Hallucinations: None  Suicidal Thoughts:  No  Homicidal Thoughts:  No  Memory:  Immediate;   Fair Recent;   Fair Remote;   Fair  Judgement:  Fair  Insight:  Fair  Psychomotor Activity:  Normal  Concentration:  Concentration: Fair  Recall:  [employer] of Knowledge:  Fair  Language:  Fair  Akathisia:  No  Handed:  Right  AIMS (if indicated):     Assets:  Communication Skills Physical Health Resilience Social Support  ADL's:  Intact  Cognition:  WNL  Sleep:  Number of Hours: 6.5     I agree with current treatment plan on 12/14/2016, Patient seen face-to-face for psychiatric evaluation follow-up, chart reviewed. Reviewed the information documented and agree with the treatment plan.  Treatment Plan Summary: Daily contact with patient to assess and evaluate symptoms and progress in treatment and Medication management  Major  depressive disorder, recurrent severe without psychotic features (HCC) unstable  Orders placed for 1:1 for safety  Patient will benefit from inpatient treatment and stabilization.   Estimated length of stay is 5-7 days.   Reviewed past medical records,treatment plan.   For Depression: Increase Effexor XR to 225 mg po daily.  For PTSD sx: Effexor XR 225 mg po daily.  Continue Seroquel 50 mg po qhs - helps with nightmares per patient.  For insomnia: Continue Seroquel 50 mg po qhs.  For anxiety sx: Gabapentin 200 mg po tid.  Will continue to monitor vitals ,medication compliance and treatment side effects while patient is here.   Will monitor for medical issues as well as call consult as needed.   Reviewed labs cbc - wbc- elevated , cmp - wnl , uds - negative ,will order tsh, lipid panel, hba1c, pl.  Will get EKG for qtc monitoring.  CSW will start working on disposition.   Patient to participate in therapeutic milieu .   Oneta Rack, NP 12/13/2016, 12:57 PM   Agree with NP Progress Note as above

## 2016-12-13 NOTE — Progress Notes (Signed)
Patient ID: Anthony [name], male   DOB: February 05, 1961, 56 y.o.   MRN: [id number] [facility] RN,. Pt is safe on unit and continuous 1:1 observation for safety.

## 2016-12-13 NOTE — Progress Notes (Signed)
1:1 Note: Patient maintained on constant supervision for safety.  Patient ambulatory at times on the unit without difficulty.  Patient visible in milieu for activities and therapy.  Patient offered no complaint.  Routine safety checks maintained.  Patient observed socializing with peers in the dayroom.  Patient safe with supervision.

## 2016-12-13 NOTE — Progress Notes (Signed)
Patient ID: [place] BingJeffrey Younes, male   DOB: [birth year], 56 y.o.   MRN: [id number]  1:1 notes  12/13/2016 @ 0000  D: Patient in bed sleeping. Respiration regular and unlabored. No sign of distress noted at this time A: 1:1 observation for safety R: Patient remains asleep. Sitter at bedside. 1:1 continues

## 2016-12-13 NOTE — Progress Notes (Signed)
Adult Psychoeducational Group Note  Date:  12/13/2016 Time:  8:56 PM  Group Topic/Focus:  Wrap-Up Group:   The focus of this group is to help patients review their daily goal of treatment and discuss progress on daily workbooks.   Participation Level:  Active  Participation Quality:  Appropriate  Affect:  Appropriate  Cognitive:  Alert  Insight: Appropriate  Engagement in Group:  Engaged  Modes of Intervention:  Discussion  Additional Comments:  When asked how patient's day was, patient stated, "not worth a damn. They had me in this chair all day".  Ash Mcelwain L Rheta Hemmelgarn 12/13/2016, 8:56 PM

## 2016-12-13 NOTE — Progress Notes (Signed)
Patient ID: Anthony [name], male   DOB: [birth year], 56 y.o.   MRN: [id number]  1:1 notes  12/13/2016 @ 2200  D: Patient in dayroom watching TV and interacting with peers. Pt decided he did not need wheelchair and has been ambulating without it.  Pt encourage to minimze pacing . Pt attended evening wrap up group and engaged in discussion. No sign of physical distress noted. A: Pt likes to be engage in power struggle so sitter advised to stay close to pt to prevent falling. Medication administered as prescribed 1:1 continuous observation for safety R: Patient is safe. Sitter at pt's side.

## 2016-12-13 NOTE — [facility] Group Notes (Signed)
[facility] LCSW Group Therapy  12/13/2016 1:15 pm  Type of Therapy: Process Group Therapy  Participation Level:  Active  Participation Quality:  Appropriate  Affect:  Flat  Cognitive:  Oriented  Insight:  Improving  Engagement in Group:  Limited  Engagement in Therapy:  Limited  Modes of Intervention:  Activity, Clarification, Education, Problem-solving and Support  Summary of Progress/Problems: Today's group addressed the issue of overcoming obstacles.  Patients were asked to identify their biggest obstacle post d/c that stands in the way of their on-going success, and then problem solve as to how to manage this.  In and out a couple of times, engaged while present.  "Staying on my meds is my biggest obstacle.  I forget to take them."  Then clarified that he has been taking his meds, and this was a past problem.  Unable to identify a current obstacle.  Rather intrusive at times, but eventually gives up when ignored.  Daryel Geraldorth, Jerime Arif B 12/13/2016   3:25 PM

## 2016-12-13 NOTE — Progress Notes (Signed)
Patient ID: [place] [name], male   DOB: December 17, 1960, 56 y.o.   MRN: [id number]  1:1 notes  12/13/2016 @ 0600  D: Patient in bed sleeping. Respiration regular and unlabored. No sign of distress noted at this time A: 1:1 observation for safety R: Patient remains asleep. Sitter at bedside. 1:1 continues

## 2016-12-13 NOTE — Progress Notes (Signed)
Recreation Therapy Notes  Date: 12/13/16 Time: 1000 Location: 500 Hall Dayroom  Group Topic: Wellness  Goal Area(s) Addresses:  Patient will define components of whole wellness. Patient will verbalize benefit of whole wellness.  Behavioral Response: Engaged  Intervention:  [employer] ball, chairs  Activity:  [employer]orGoing Volleyball.  Patients were to sit in a chair in a circle.  Patients were to pass the ball back and forth to each within the circle.  Patients could bounce the ball off of floor but it could not roll to a stop.  If the ball came to a stop LRT would start the count over.  Patients were trying to beat the previous record set by another group.    Education: Wellness, Building control surveyorDischarge Planning.   Education Outcome: Acknowledges education/In group clarification offered/Needs additional education.   Clinical Observations/Feedback:  Pt was engaged and social during group.  Pt was cracking jokes and making funny comments during group.  Pt had his peers laughing and would challenge them in a friendly way during group.   Caroll RancherMarjette Seva Chancy, LRT/CTRS     Caroll RancherLindsay, Gilman Olazabal A 12/13/2016 12:42 PM

## 2016-12-13 NOTE — Progress Notes (Signed)
Patient ID: Anthony [name], male   DOB: 1961/07/11, 56 y.o.   MRN: [id number]  1:1 notes  12/13/2016 @ 0400  D: Patient in bed sleeping. Respiration regular and unlabored. No sign of distress noted at this time A: 1:1 observation for safety R: Patient remains asleep. Sitter at bedside. 1:1 continues

## 2016-12-13 NOTE — Progress Notes (Signed)
1:1 Note: Patient maintained on constant supervision for safety.  Patient is alert and oriented.  Patient ambulatory on the unit on wheel chair self propelling.  No behavioral issues noted.  Medication given as prescribed.  Routine safety checks continues.

## 2016-12-14 MED ORDER — NICOTINE 21 MG/24HR TD PT24: 21.0000 mg | MEDICATED_PATCH | Freq: Every day | TRANSDERMAL | 0 refills | Status: DC

## 2016-12-14 MED ORDER — LORATADINE 10 MG PO TABS: 10.0000 mg | ORAL_TABLET | Freq: Every day | ORAL | 0 refills | Status: DC

## 2016-12-14 MED ORDER — TRAZODONE HCL 50 MG PO TABS: 50.0000 mg | ORAL_TABLET | Freq: Every evening | ORAL | 0 refills | Status: DC | PRN

## 2016-12-14 MED ORDER — HYDROXYZINE HCL 50 MG PO TABS: 50.0000 mg | ORAL_TABLET | Freq: Three times a day (TID) | ORAL | 0 refills | Status: DC | PRN

## 2016-12-14 MED ORDER — GABAPENTIN 100 MG PO CAPS: 200.0000 mg | ORAL_CAPSULE | Freq: Two times a day (BID) | ORAL | 0 refills | Status: DC

## 2016-12-14 MED ORDER — FLUTICASONE PROPIONATE 50 MCG/ACT NA SUSP: 1.0000 | Freq: Every day | NASAL | 1 refills | Status: DC

## 2016-12-14 MED ORDER — VENLAFAXINE HCL ER 75 MG PO CP24
225.0000 mg | ORAL_CAPSULE | Freq: Every day | ORAL | 0 refills | Status: DC
Start: 2016-12-15 — End: 2017-09-16

## 2016-12-14 MED ORDER — QUETIAPINE FUMARATE 50 MG PO TABS: 50.0000 mg | ORAL_TABLET | Freq: Every day | ORAL | 0 refills | Status: DC

## 2016-12-14 NOTE — Progress Notes (Signed)
1:1 Note: Patient maintained on constant supervision for safety.  Patient ambulatory on the unit without difficulty.  Medication given as prescribed.  Denies auditory and visual hallucinations.  Denies suicidal ideation.  Patient verbalizes readiness for discharge.  Routine safety checks continues.  Patient safe with supervision.

## 2016-12-14 NOTE — Tx Team (Signed)
Interdisciplinary Treatment and Diagnostic Plan Update  12/14/2016 Time of Session: 2:09 PM  Anthony [name] MRN: [id number]  Principal Diagnosis: Major depressive disorder, recurrent severe without psychotic features [facility])  Secondary Diagnoses: Principal Problem:   Major depressive disorder, recurrent severe without psychotic features ([place]) Active Problems:   PTSD (post-traumatic stress disorder)   H/O encephalitis   Current Medications:  Current Facility-Administered Medications  Medication Dose Route Frequency Provider Last Rate Last Dose  . acetaminophen (TYLENOL) tablet 650 mg  650 mg Oral Q6H PRN Patrecia Pour, NP   650 mg at 12/13/16 2240  . alum & mag hydroxide-simeth (MAALOX/MYLANTA) 200-200-20 MG/5ML suspension 30 mL  30 mL Oral PRN Patrecia Pour, NP      . fluticasone (FLONASE) 50 MCG/ACT nasal spray 1 spray  1 spray Each Nare Daily Encarnacion Slates, NP   1 spray at 12/14/16 0908  . gabapentin (NEURONTIN) capsule 200 mg  200 mg Oral BID Patrecia Pour, NP   200 mg at 12/14/16 0909  . guaiFENesin (MUCINEX) 12 hr tablet 600 mg  600 mg Oral BID Encarnacion Slates, NP   600 mg at 12/14/16 0908  . hydrOXYzine (ATARAX/VISTARIL) tablet 50 mg  50 mg Oral TID PRN Patrecia Pour, NP   50 mg at 12/09/16 2124  . loratadine (CLARITIN) tablet 10 mg  10 mg Oral Daily Encarnacion Slates, NP   10 mg at 12/14/16 0909  . magnesium hydroxide (MILK OF MAGNESIA) suspension 30 mL  30 mL Oral Daily PRN Patrecia Pour, NP      . nicotine (NICODERM CQ - dosed in mg/24 hours) patch 21 mg  21 mg Transdermal Daily Patrecia Pour, NP   21 mg at 12/14/16 0911  . OLANZapine (ZYPREXA) tablet 5 mg  5 mg Oral TID PRN Ursula Alert, MD       Or  . OLANZapine (ZYPREXA) injection 5 mg  5 mg Intramuscular TID PRN Ursula Alert, MD      . ondansetron (ZOFRAN) tablet 4 mg  4 mg Oral Q8H PRN Patrecia Pour, NP      . QUEtiapine (SEROQUEL) tablet 50 mg  50 mg Oral QHS Patrecia Pour, NP   50 mg at 12/13/16 2240  . traZODone  (DESYREL) tablet 50 mg  50 mg Oral QHS PRN Patrecia Pour, NP   50 mg at 12/09/16 2229  . venlafaxine XR (EFFEXOR-XR) 24 hr capsule 225 mg  225 mg Oral Q breakfast Ursula Alert, MD   225 mg at 12/14/16 0909    PTA Medications: Prescriptions Prior to Admission  Medication Sig Dispense Refill Last Dose  . QUEtiapine (SEROQUEL) 50 MG tablet Take 1 tablet (50 mg total) by mouth at bedtime as needed (sleep). 30 tablet 0 12/07/2016 at Unknown time  . venlafaxine XR (EFFEXOR-XR) 150 MG 24 hr capsule Take 150 mg by mouth daily with breakfast.   12/08/2016 at Unknown time  . carisoprodol (SOMA) 350 MG tablet TAKE ONE TABLET BY MOUTH FOUR TIMES DAILY AS NEEDED FOR MUSCLE SPASMS  0   . naproxen (NAPROSYN) 500 MG tablet Take 1 tablet (500 mg total) by mouth 2 (two) times daily. (Patient not taking: Reported on 12/10/2016) 20 tablet 0 Not Taking at Unknown time  . traZODone (DESYREL) 50 MG tablet Take 1 tablet (50 mg total) by mouth at bedtime and may repeat dose one time if needed. (Patient not taking: Reported on 12/10/2016) 60 tablet 0 Not Taking at Unknown time  Treatment Modalities: Medication Management, Group therapy, Case management,  1 to 1 session with clinician, Psychoeducation, Recreational therapy.   Physician Treatment Plan for Primary Diagnosis: Major depressive disorder, recurrent severe without psychotic features ([place]) Long Term Goal(s): Improvement in symptoms so as ready for discharge  Short Term Goals: Ability to identify changes in lifestyle to reduce recurrence of condition will improve  Medication Management: Evaluate patient's response, side effects, and tolerance of medication regimen.  Therapeutic Interventions: 1 to 1 sessions, Unit Group sessions and Medication administration.  Evaluation of Outcomes: Adequate for Discharge  Physician Treatment Plan for Secondary Diagnosis: Principal Problem:   Major depressive disorder, recurrent severe without psychotic features  ([place]) Active Problems:   PTSD (post-traumatic stress disorder)   H/O encephalitis   Long Term Goal(s): Improvement in symptoms so as ready for discharge  Short Term Goals: Ability to identify triggers associated with substance abuse/mental health issues will improve  Medication Management: Evaluate patient's response, side effects, and tolerance of medication regimen.  Therapeutic Interventions: 1 to 1 sessions, Unit Group sessions and Medication administration.  Evaluation of Outcomes: Adequate for Discharge   RN Treatment Plan for Primary Diagnosis: Major depressive disorder, recurrent severe without psychotic features ([place]) Long Term Goal(s): Knowledge of disease and therapeutic regimen to maintain health will improve  Short Term Goals: Ability to identify and develop effective coping behaviors will improve and Compliance with prescribed medications will improve  Medication Management: RN will administer medications as ordered by provider, will assess and evaluate patient's response and provide education to patient for prescribed medication. RN will report any adverse and/or side effects to prescribing provider.  Therapeutic Interventions: 1 on 1 counseling sessions, Psychoeducation, Medication administration, Evaluate responses to treatment, Monitor vital signs and CBGs as ordered, Perform/monitor CIWA, COWS, AIMS and Fall Risk screenings as ordered, Perform wound care treatments as ordered.  Evaluation of Outcomes: Adequate for Discharge   LCSW Treatment Plan for Primary Diagnosis: Major depressive disorder, recurrent severe without psychotic features ([place]) Long Term Goal(s): Safe transition to appropriate next level of care at discharge, Engage patient in therapeutic group addressing interpersonal concerns.  Short Term Goals: Engage patient in aftercare planning with referrals and resources and Increase skills for wellness and recovery  Therapeutic Interventions: Assess for  all discharge needs, 1 to 1 time with Social worker, Explore available resources and support systems, Assess for adequacy in community support network, Educate family and significant other(s) on suicide prevention, Complete Psychosocial Assessment, Interpersonal group therapy.  Evaluation of Outcomes: Met   Progress in Treatment: Attending groups: Yes Participating in groups: Yes Taking medication as prescribed: Yes, MD continues to assess for medication changes as needed Toleration medication: Yes, no side effects reported at this time Family/Significant other contact made: Patient refused.  Patient understands diagnosis: Limited insight  Discussing patient identified problems/goals with staff: Yes Medical problems stabilized or resolved: Yes Denies suicidal/homicidal ideation: No  Issues/concerns per patient self-inventory: None Other: N/A  New problem(s) identified: None identified at this time.   New Short Term/Long Term Goal(s):  Discharge Plan or Barriers: Return back to [facility] (residential) and follow up with their drug treatment and psychiatric services.   Reason for Continuation of Hospitalization:   Estimated Length of Stay: D/C today  Attendees: Patient: 12/14/2016  2:09 PM  Physician: Neita Garnet, MD 12/14/2016  2:09 PM  Nursing: Elesa Massed, RN 12/14/2016  2:09 PM  RN Care Manager: Lars Pinks 12/14/2016  2:09 PM  Social Worker: Ripley Fraise, LCSW 12/14/2016  2:09 PM  Recreational Therapist: Winfield Cunas 12/14/2016  2:09 PM  Other: Radonna Ricker, Social Work Intern  12/14/2016  2:09 PM  Other:  12/14/2016  2:09 PM  Other: 12/14/2016  2:09 PM    Scribe for Treatment Team: Ripley Fraise LCSW 12/14/2016 2:09 PM

## 2016-12-14 NOTE — Discharge Summary (Signed)
Physician Discharge Summary Note  Patient:  Anthony [name] is an 56 y.o., male MRN:  [id number] DOB:  May 31, 1961 Patient phone:  [phone] (home)  Patient address:   [address] [place] [address],  Total Time spent with patient: 30 minutes  Date of Admission:  12/09/2016 Date of Discharge: 12/14/2016  Reason for Admission: Per H&P-  Anthony [name] a 56 y.o.caucasian male, who is separated , on SSD, who lives in [place] , has a hx of depression, PTSD and cocaine, cannabis abuse - presented with worsening  SI with plan to walk in traffic . Per initial notes in EHR :"Pt reported that he has a history of Major Depressive Disorder (Severe), PTSD, and Cocaine Use Disorder as well as Cannabis Use Disorder. He is currently enrolled in the [facility] program for drug treatment and also receives psychiatric services with them (prescribed Seroquel, Effexor, Depakote). 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 Pt reported that today he got into an argument with his ex-wife (she called him an SOB), and as a result, he became suicidal with plan to walk into traffic or step onto train tracks. Pt also endorsed persistent and unremitting despondency, tearfulness, irritability, and feelings of worthlessness and hopelessness. Pt stated that he has attempted suicide on two other occasions. Pt denied homicidal ideation, auditory/visual hallucination, or self-injury. Pt declined to plan for safety.During assessment, Pt presented as alert and oriented. He had good eye contact and was cooperative. Pt's demeanor was calm. Pt was dressed in street clothes and appeared appropriately groomed. Pt's mood was reported as depressed, and affect was congruent. Pt endorsed ongoing suicidal ideation with plan as well as other depressive symptoms (see above). Pt also endorsed ongoing cannabis use (last use was 12/01/16) and past crack cocaine use (last use was  reported to be three months ago). "  Principal Problem: Major depressive disorder, recurrent severe without psychotic features [facility]) Discharge Diagnoses: Patient Active Problem List   Diagnosis Date Noted  . 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 Major depressive disorder, recurrent severe without psychotic features (HCC) [F33.2] 12/09/2016  . Allergic reaction [T78.40XA]   . Rash [R21]   . Suicidal ideation [R45.851]   . PTSD (post-traumatic stress disorder) [F43.10] 09/12/2014  . Episodic mood disorder (HCC) [F39] 09/11/2014    Past Psychiatric History:   Past Medical History:  Past Medical History:  Diagnosis Date  . Bell's palsy   . Depression   . Post traumatic stress disorder   . Seizures (HCC)    last seizure 12 yrs ago    Past Surgical History:  Procedure Laterality Date  . APPENDECTOMY    . brain damage    . CHOLECYSTECTOMY    . FOOT SURGERY Bilateral   . KNEE SURGERY Right    Family History:  Family History  Problem Relation Age of Onset  . Cancer Mother   . Heart attack Father   . Mental illness Sister   . Alcoholism Brother    Family Psychiatric  History:  Social History:  History  Alcohol Use No    Comment: no etoh ffor 22     History  Drug Use  . Frequency: 2.0 times per week  . Types: Marijuana    Comment: Weekly use    Social History   Social History  . Marital status: Divorced    Spouse name: N/A  . Number of children: N/A  . Years of education: N/A   Social  History Main Topics  . Smoking status: Current Every Day Smoker    Packs/day: 1.00    Years: 38.00    Types: Cigarettes  . Smokeless tobacco: Never Used  . Alcohol use No     Comment: no etoh ffor 20  . Drug use:     Frequency: 2.0 times per week    Types: Marijuana     Comment: Weekly use  . Sexual activity: Yes    Birth control/ protection: None   Other Topics Concern  . None   Social History Narrative  . None    Hospital Course:  Anthony [name] was admitted  for Major depressive disorder, recurrent severe without psychotic features (HCC)  and crisis management.  Pt was treated discharged with the medications listed below under Medication List.  Medical problems were identified and treated as needed.  Home medications were restarted as appropriate.  Improvement was monitored by observation and [place] Anthony [name] 's daily report of symptom reduction.  Emotional and mental status was monitored by daily self-inventory reports completed by  Anthony [name] and clinical staff.         Anthony [name] was evaluated by the treatment team for stability and plans for continued recovery upon discharge. Anthony [name] 's motivation was an integral factor for scheduling further treatment. Employment, transportation, bed availability, health status, family support, and any pending legal issues were also considered during hospital stay. Pt was offered further treatment options upon discharge including but not limited to Residential, Intensive Outpatient, and Outpatient treatment.  Anthony [name] will follow up with the services as listed below under Follow Up Information.     Upon completion of this admission the patient was both mentally and medically stable for discharge denying suicidal/homicidal ideation, auditory/visual/tactile hallucinations, delusional thoughts and paranoia.     Anthony [name] responded well to treatment with Neurontin 200 mg, Zyprexa 5 mg, Seroquel 50 mg and Effexor 225 mg without adverse effects.Pt demonstrated improvement without reported or observed adverse effects to the point of stability appropriate for outpatient management. Pertinent labs include:  Prolactin 15.5 (high), Hgb A1C 5.1 (high), for which outpatient follow-up is necessary for lab recheck as mentioned below. Reviewed CBC, CMP, BAL, and UDS; all unremarkable aside from noted exceptions.   Physical Findings: AIMS: Facial and Oral Movements Muscles of Facial Expression: None, normal Lips  and Perioral Area: None, normal Jaw: None, normal Tongue: None, normal,Extremity Movements Upper (arms, wrists, hands, fingers): None, normal Lower (legs, knees, ankles, toes): None, normal, Trunk Movements Neck, shoulders, hips: None, normal, Overall Severity Severity of abnormal movements (highest score from questions above): None, normal Incapacitation due to abnormal movements: None, normal Patient's awareness of abnormal movements (rate only patient's report): No Awareness, Dental Status Current problems with teeth and/or dentures?: Yes Does patient usually wear dentures?: No  CIWA:    COWS:     Musculoskeletal: Strength & Muscle Tone: within normal limits Gait & Station: normal Patient leans: N/A  Psychiatric Specialty Exam: Physical Exam  Constitutional: He is oriented to person, place, and time.  Neurological: He is alert and oriented to person, place, and time.  Psychiatric: He has a normal mood and affect. His behavior is normal.    Review of Systems  Psychiatric/Behavioral: Negative for depression (stable) and suicidal ideas. The patient is not nervous/anxious (stable).     Blood pressure 115/79, pulse 85, temperature 97.5 F (36.4 C), temperature source Oral, resp. rate 18, height 5\' 9"  (1.753 m), weight 94.3 kg (208 lb), SpO2 95 %.Body mass  index is 30.72 kg/m.   Have you used any form of tobacco in the last 30 days? (Cigarettes, Smokeless Tobacco, Cigars, and/or Pipes): Yes  Has this patient used any form of tobacco in the last 30 days? (Cigarettes, Smokeless Tobacco, Cigars, and/or Pipes) Yes, A prescription for an FDA-approved tobacco cessation medication was offered at discharge and the patient refused  Blood Alcohol level:  Lab Results  Component Value Date   [facility] <5 12/08/2016   ETH <5 10/20/2016    Metabolic Disorder Labs:  Lab Results  Component Value Date   HGBA1C 5.7 (H) 12/11/2016   MPG 117 12/11/2016   Lab Results  Component Value Date    PROLACTIN 15.5 (H) 12/11/2016   Lab Results  Component Value Date   CHOL 153 12/11/2016   TRIG 77 12/11/2016   HDL 69 12/11/2016   CHOLHDL 2.2 12/11/2016   VLDL 15 12/11/2016   LDLCALC 69 12/11/2016    See Psychiatric Specialty Exam and Suicide Risk Assessment completed by Attending Physician prior to discharge.  Discharge destination:  Home  Is patient on multiple antipsychotic therapies at discharge:  No   Has Patient had three or more failed trials of antipsychotic monotherapy by history:  No  Recommended Plan for Multiple Antipsychotic Therapies: NA  Discharge Instructions    Diet - low sodium heart healthy    Complete by:  As directed    Discharge instructions    Complete by:  As directed    Take all medications as prescribed. Keep all follow-up appointments as scheduled.  Do not consume alcohol or use illegal drugs while on prescription medications. Report any adverse effects from your medications to your primary care provider promptly.  In the event of recurrent symptoms or worsening symptoms, call 911, a crisis hotline, or go to the nearest emergency department for evaluation.   Increase activity slowly    Complete by:  As directed      Allergies as of 12/14/2016      Reactions   Nuedexta [dextromethorphan-quinidine] Other (See Comments)   hallucinations      Medication List    STOP taking these medications   carisoprodol 350 MG tablet Commonly known as:  SOMA   naproxen 500 MG tablet Commonly known as:  NAPROSYN     TAKE these medications     Indication  fluticasone 50 MCG/ACT nasal spray Commonly known as:  FLONASE Place 1 spray into both nostrils daily. Start taking on:  12/15/2016  Indication:  Allergic Rhinitis   gabapentin 100 MG capsule Commonly known as:  NEURONTIN Take 2 capsules (200 mg total) by mouth 2 (two) times daily.  Indication:  Agitation, mood   hydrOXYzine 50 MG tablet Commonly known as:  ATARAX/VISTARIL Take 1 tablet (50 mg  total) by mouth 3 (three) times daily as needed for anxiety or itching.  Indication:  Anxiety Neurosis   loratadine 10 MG tablet Commonly known as:  CLARITIN Take 1 tablet (10 mg total) by mouth daily. Start taking on:  12/15/2016  Indication:  Hayfever   nicotine 21 mg/24hr patch Commonly known as:  NICODERM CQ - dosed in mg/24 hours Place 1 patch (21 mg total) onto the skin daily. Start taking on:  12/15/2016  Indication:  Nicotine Addiction   QUEtiapine 50 MG tablet Commonly known as:  SEROQUEL Take 1 tablet (50 mg total) by mouth at bedtime. What changed:  when to take this  reasons to take this  Indication:  Trouble Sleeping   traZODone  50 MG tablet Commonly known as:  DESYREL Take 1 tablet (50 mg total) by mouth at bedtime as needed for sleep. What changed:  when to take this  reasons to take this  Indication:  Trouble Sleeping   venlafaxine XR 75 MG 24 hr capsule Commonly known as:  EFFEXOR-XR Take 3 capsules (225 mg total) by mouth daily with breakfast. Start taking on:  12/15/2016 What changed:  medication strength  how much to take  Indication:  Generalized Anxiety Disorder      Follow-up Information    [facility]   Why:  [facility]. Pricilla Handleronya Neal Nurse, learning disability( Housing Director, [phone]) will be picking you up at discharge. Contact information: Telephone: [phone] Fax:   [address], [place], [place] [address]       [facility] Follow up on 12/20/2016.   Why:  Monday at 12:30 to fill out paperwork for a 1:00 appointment.  This is where you will be able to see a psychiatrist-Dr Headon.  Please bring ID and insurance card, and call to confirm you appointment. Contact information: [address]  [place] [336] [phone]          Follow-up recommendations:  Activity:  as tolerated Diet:  heart healthy  Comments:  Take all medications as prescribed. Keep all follow-up  appointments as scheduled.  Do not consume alcohol or use illegal drugs while on prescription medications. Report any adverse effects from your medications to your primary care provider promptly.  In the event of recurrent symptoms or worsening symptoms, call 911, a crisis hotline, or go to the nearest emergency department for evaluation.   Signed: Oneta Rackanika N Lewis, NP 12/14/2016, 1:50 PM

## 2016-12-14 NOTE — Progress Notes (Signed)
Patient discharged to lobby. Patient was stable and appreciative at that time. All papers and prescriptions were given and valuables returned. Verbal understanding expressed. Denies SI/HI and A/VH. Patient given opportunity to express concerns and ask questions.  

## 2016-12-14 NOTE — Progress Notes (Signed)
1:1 Note: Patient maintained on constant supervision for safety. Patient is alert and oriented to person, time and place.  Patient ambulatory on the unit without difficulty.  Routine safety checks continues.  No complaint offered.  Patient safe with supervision.  Rates depression as 0, hopelessness as 0, and anxiety as 0.  Described energy level as normal and concentration as good.  Support and encouragement offered as needed.

## 2016-12-14 NOTE — Progress Notes (Signed)
  [facility] :  Will you be returning to the same living situation after discharge:  Yes,  back to program At discharge, do you have transportation home?: Yes,  ex-wife Do you have the ability to pay for your medications: Yes,  MCD  Release of information consent forms completed and in the chart;  Patient's signature needed at discharge.  Patient to Follow up at: Follow-up Information    [facility].   Why:  [facility]. Pricilla Handleronya Neal Nurse, learning disability( Housing Director, [phone]) will be picking you up at discharge. Contact information: Telephone: [phone] Fax:   [address], [place], [place] [address]       [facility] Follow up on 12/20/2016.   Why:  Monday at 12:30 to fill out paperwork for a 1:00 appointment.  This is where you will be able to see a psychiatrist-Dr Headon.  Please bring ID and insurance card, and call to confirm you appointment. Contact information: [address]  [place] [phone]          Next level of care provider has [facility] Link:no  Safety Planning and Suicide Prevention discussed: Yes,  ywes  Have you used any form of tobacco in the last 30 days? (Cigarettes, Smokeless Tobacco, Cigars, and/or Pipes): Yes  Has patient been referred to the Quitline?: Patient refused referral  Patient has been referred for addiction treatment: Yes  Ida RogueRodney B Ledarius Leeson 12/14/2016, 2:05 PM

## 2016-12-14 NOTE — Plan of Care (Signed)
Problem: [facility] Participation in Recreation Therapeutic Interventions Goal: STG-Patient will identify at least five coping skills for ** STG: Coping Skills - Patient will be able to identify at least 5 coping skills for depression by conclusion of recreation therapy tx  Outcome: Completed/Met Date Met: 12/14/16 Pt was able to identify coping skills at the completion of coping skills recreation therapy session.  Victorino Sparrow, LRT/CTRS

## 2016-12-14 NOTE — Progress Notes (Signed)
Recreation Therapy Notes  Date: 12/14/16 Time: 1000 Location: 500 Hall Dayroom  Group Topic: Coping Skills  Goal Area(s) Addresses:  Pt will be able to identify positive coping skills. Pt will be able to identify benefits of coping skills. Pt will be able to identify benefits of coping skills post d/c.  Behavioral Response: Engaged  Intervention: Social workerMind Map worksheet, pencils  Activity: Fish farm managerCoping Skills Mind Map.  LRT introduced the concept of coping skills.  Patients were given a worksheet with a mind map to develop coping skills.  LRT and patients filled out the first 8 squares together, identifying triggers that would lead to the use of coping skills.  For each trigger patients were to list at least 3 coping skills.  Education: PharmacologistCoping Skills, Building control surveyorDischarge Planning.   Education Outcome: Acknowledges understanding/In group clarification offered/Needs additional education.   Clinical Observations/Feedback: Pt stated coping skills "help you deal with how to get out of the hospital".  Pt was appropriate and engaged throughout group.  Pt identified some of his coping skills as doing activities with people, taking medications and talking in a group setting.     Caroll RancherMarjette Maryan Sivak, LRT/CTRS      Caroll RancherLindsay, Mindel Friscia A 12/14/2016 11:48 AM

## 2016-12-14 NOTE — Progress Notes (Signed)
Patient ID: Anthony [name], male   DOB: 03-03-1961, 56 y.o.   MRN: [id number]  1:1 notes  12/14/2016 @ 0200  D: Patient in bed sleeping. Respiration regular and unlabored. No sign of distress noted at this time A: 1:1 observation for safety R: Patient remains safe. Sitter at bedside. 1:1 continues

## 2016-12-14 NOTE — Progress Notes (Signed)
Patient ID: Anthony [name], male   DOB: 25-Dec-1960, 56 y.o.   MRN: [id number]  1:1 notes  12/14/2016 @ 0600  D: Patient in bed sleeping. Respiration regular and unlabored. No sign of distress noted at this time A: 1:1 observation for safety R: Patient remains safe. Sitter at bedside. 1:1 continues

## 2016-12-14 NOTE — Plan of Care (Signed)
Problem: Safety: Goal: Periods of time without injury will increase Outcome: Progressing Pt is safe from injury this shift

## 2016-12-14 NOTE — BHH Suicide Risk Assessment (Signed)
[facility] Discharge Suicide Risk Assessment   Principal Problem: Major depressive disorder, recurrent severe without psychotic features [facility]) Discharge Diagnoses:  Patient Active Problem List   Diagnosis Date Noted  . 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 Major depressive disorder, recurrent severe without psychotic features (HCC) [F33.2] 12/09/2016  . Allergic reaction [T78.40XA]   . Rash [R21]   . Suicidal ideation [R45.851]   . PTSD (post-traumatic stress disorder) [F43.10] 09/12/2014  . Episodic mood disorder [facility]) [F39] 09/11/2014  Patient is a 56 year old male with a diagnosis  of Major Depressive Disorder (Severe), PTSD, and Cocaine Use Disorder as well as Cannabis Use Disorder.   Vision this morning reports that he is doing fairly well, is taking his medications as prescribed, no longer feels depressed. Patient denies any feelings of hopelessness, worthlessness or guilt. He also denies any thoughts of hurting himself or others. Patient denies any psychotic symptoms and reports that he is ready to be discharged. Patient states that he has social supports, someone who helps him with his pillbox and adds that he takes his medications as prescribed    Total Time spent with patient: 30 minutes  Musculoskeletal: Strength & Muscle Tone: within normal limits Gait & Station: normal Patient leans: N/A  Psychiatric Specialty Exam: Review of Systems  Constitutional: Negative.     Blood pressure 115/79, pulse 85, temperature 97.5 F (36.4 C), temperature source Oral, resp. rate 18, height 5\' 9"  (1.753 m), weight 94.3 kg (208 lb), SpO2 95 %.Body mass index is 30.72 kg/m.  General Appearance: Casual  Eye Contact::  Fair  Speech:  Clear and Coherent and Normal Rate409  Volume:  Normal  Mood:  Euthymic  Affect:  Congruent and Full Range  Thought Process:  Coherent and Goal Directed  Orientation:  Full (Time, Place, and Person)  Thought Content:  WDL  Suicidal Thoughts:  No  Homicidal  Thoughts:  No  Memory:  Immediate;   Fair Recent;   Fair Remote;   Fair  Judgement:  Intact  Insight:  Present  Psychomotor Activity:  Normal  Concentration:  Fair  Recall:  [employer] of Knowledge:Fair  Language: Fair  Akathisia:  No  Handed:  Right  AIMS (if indicated):     Assets:  Desire for Improvement Housing Social Support Transportation  Sleep:  Number of Hours: 6  Cognition: WNL  ADL's:  Intact   Mental Status Per Nursing Assessment::   On Admission:  Suicidal ideation indicated by patient, Suicide plan, Plan includes specific time, place, or method, Self-harm thoughts, Self-harm behaviors  Demographic Factors:  Male and Caucasian  Loss Factors: NA  Historical Factors: NA  Risk Reduction Factors:   Living with another person, especially a relative and Positive social support  Continued Clinical Symptoms:  Previous Psychiatric Diagnoses and Treatments  Cognitive Features That Contribute To Risk:  None    Suicide Risk:  Minimal: No identifiable suicidal ideation.  Patients presenting with no risk factors but with morbid ruminations; may be classified as minimal risk based on the severity of the depressive symptoms  Follow-up Information    [facility] Follow up today.   Why:  Return to [facility]. Pricilla Handler Nurse, learning disability, [phone]) will be picking you up at discharge. Contact information: Telephone: [phone] Fax:   [address] [place], [place] [address]       [facility] Follow up on 12/20/2016.   Why:  Monday at 12:30 to fill out paperwork for a  1:00 appointment.  This is where you will be able to see a psychiatrist-Dr Headon.  Please bring ID and insurance card, and call to confirm you appointment. Contact information: [address]  [place] [336] [phone]          Plan Of Care/Follow-up recommendations:  Activity:  As tolerated Diet:  Regular Other:  Keep follow-up  appointments and take medications as prescribed  Nelly RoutKUMAR,Bladen Umar, MD 12/14/2016, 1:50 PM

## 2017-01-03 ENCOUNTER — Encounter (HOSPITAL_COMMUNITY): Payer: Self-pay | Admitting: Emergency Medicine

## 2017-01-03 ENCOUNTER — Emergency Department (HOSPITAL_COMMUNITY)
Admission: EM | Admit: 2017-01-03 | Discharge: 2017-01-03 | Disposition: A | Discharge status: Other Acute Inpatient Hospital | Payer: Medicare HMO | Attending: Emergency Medicine | Admitting: Emergency Medicine

## 2017-01-03 DIAGNOSIS — Z79899 Other long term (current) drug therapy: Secondary | ICD-10-CM | POA: Insufficient documentation

## 2017-01-03 DIAGNOSIS — F1429 Cocaine dependence with unspecified cocaine-induced disorder: Secondary | ICD-10-CM | POA: Insufficient documentation

## 2017-01-03 DIAGNOSIS — F17210 Nicotine dependence, cigarettes, uncomplicated: Secondary | ICD-10-CM | POA: Diagnosis not present

## 2017-01-03 DIAGNOSIS — F332 Major depressive disorder, recurrent severe without psychotic features: Secondary | ICD-10-CM

## 2017-01-03 DIAGNOSIS — R45851 Suicidal ideations: Secondary | ICD-10-CM | POA: Diagnosis present

## 2017-01-03 DIAGNOSIS — Z9114 Patient's other noncompliance with medication regimen: Secondary | ICD-10-CM | POA: Insufficient documentation

## 2017-01-03 DIAGNOSIS — F4310 Post-traumatic stress disorder, unspecified: Secondary | ICD-10-CM | POA: Insufficient documentation

## 2017-01-03 DIAGNOSIS — F1420 Cocaine dependence, uncomplicated: Secondary | ICD-10-CM

## 2017-01-03 DIAGNOSIS — F314 Bipolar disorder, current episode depressed, severe, without psychotic features: Secondary | ICD-10-CM | POA: Diagnosis present

## 2017-01-03 LAB — CBC
HEMATOCRIT: 43.4 % (ref 39.0–52.0)
HEMOGLOBIN: 15.5 g/dL (ref 13.0–17.0)
MCH: 29.2 pg (ref 26.0–34.0)
MCHC: 35.7 g/dL (ref 30.0–36.0)
MCV: 81.7 fL (ref 78.0–100.0)
Platelets: 252 10*3/uL (ref 150–400)
RBC: 5.31 MIL/uL (ref 4.22–5.81)
RDW: 13.8 % (ref 11.5–15.5)
WBC: 12.2 10*3/uL — AB (ref 4.0–10.5)

## 2017-01-03 LAB — RAPID URINE DRUG SCREEN, HOSP PERFORMED
Amphetamines: NOT DETECTED
BARBITURATES: NOT DETECTED
BENZODIAZEPINES: NOT DETECTED
COCAINE: POSITIVE — AB
OPIATES: NOT DETECTED
TETRAHYDROCANNABINOL: POSITIVE — AB

## 2017-01-03 LAB — COMPREHENSIVE METABOLIC PANEL
ALBUMIN: 4.4 g/dL (ref 3.5–5.0)
ALT: 22 U/L (ref 17–63)
ANION GAP: 9 (ref 5–15)
AST: 23 U/L (ref 15–41)
Alkaline Phosphatase: 63 U/L (ref 38–126)
BUN: 17 mg/dL (ref 6–20)
CO2: 24 mmol/L (ref 22–32)
Calcium: 9.1 mg/dL (ref 8.9–10.3)
Chloride: 104 mmol/L (ref 101–111)
Creatinine, Ser: 0.98 mg/dL (ref 0.61–1.24)
GFR calc non Af Amer: >60 mL/min (ref >60)
GLUCOSE: 106 mg/dL — AB (ref 65–99)
POTASSIUM: 3.7 mmol/L (ref 3.5–5.1)
SODIUM: 137 mmol/L (ref 135–145)
Total Bilirubin: 0.6 mg/dL (ref 0.3–1.2)
Total Protein: 7.5 g/dL (ref 6.5–8.1)

## 2017-01-03 LAB — ETHANOL: Alcohol, Ethyl (B): <5 mg/dL (ref <5)

## 2017-01-03 LAB — SALICYLATE LEVEL

## 2017-01-03 LAB — ACETAMINOPHEN LEVEL

## 2017-01-03 MED ORDER — GABAPENTIN 300 MG PO CAPS
300.0000 mg | ORAL_CAPSULE | Freq: Two times a day (BID) | ORAL | Status: DC
  Administered 2017-01-03: 300 mg via ORAL
  Filled 2017-01-03: qty 1

## 2017-01-03 MED ORDER — NICOTINE 21 MG/24HR TD PT24
21.0000 mg | MEDICATED_PATCH | Freq: Every day | TRANSDERMAL | Status: DC
  Administered 2017-01-03: 21 mg via TRANSDERMAL
  Filled 2017-01-03: qty 1

## 2017-01-03 MED ORDER — TRAZODONE HCL 50 MG PO TABS: 50.0000 mg | ORAL_TABLET | Freq: Every evening | ORAL | Status: DC | PRN

## 2017-01-03 MED ORDER — GABAPENTIN 100 MG PO CAPS: 200.0000 mg | ORAL_CAPSULE | Freq: Two times a day (BID) | ORAL | Status: DC

## 2017-01-03 MED ORDER — HYDROXYZINE HCL 25 MG PO TABS: 25.0000 mg | ORAL_TABLET | Freq: Three times a day (TID) | ORAL | Status: DC | PRN

## 2017-01-03 MED ORDER — FLUOXETINE HCL 20 MG PO CAPS
20.0000 mg | ORAL_CAPSULE | Freq: Every day | ORAL | Status: DC
  Administered 2017-01-03: 20 mg via ORAL
  Filled 2017-01-03: qty 1

## 2017-01-03 NOTE — ED Notes (Signed)
[facility] has been called.  
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
## 2017-01-03 NOTE — ED Provider Notes (Signed)
[facility]-EMERGENCY DEPT Provider Note   CSN: [id number] Arrival date & time: 01/03/17  0100     History   Chief Complaint Chief Complaint  Patient presents with  . Suicidal    HPI Anthony [name] is a 56 y.o. male.  Patient with history of depression, bipolar, PTSD presents with SI. He called [facility] and reported symptoms, who then called [facility] who brought the patient to ED. He endorses SI here with intent to walk in front of traffic. He denies self injury prior to arrival. No HI/AVH.    The history is provided by the patient. No language interpreter was used.    Past Medical History:  Diagnosis Date  . Bell's palsy   . Depression   . Post traumatic stress disorder   . Seizures (HCC)    last seizure 12 yrs ago    Patient Active Problem List   Diagnosis Date Noted  . 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 Major depressive disorder, recurrent severe without psychotic features (HCC) 12/09/2016  . Allergic reaction   . Rash   . Suicidal ideation   . PTSD (post-traumatic stress disorder) 09/12/2014  . Episodic mood disorder (HCC) 09/11/2014    Past Surgical History:  Procedure Laterality Date  . APPENDECTOMY    . brain damage    . CHOLECYSTECTOMY    . FOOT SURGERY Bilateral   . KNEE SURGERY Right        Home Medications    Prior to Admission medications   Medication Sig Start Date End Date Taking? Authorizing Provider  fluticasone (FLONASE) 50 MCG/ACT nasal spray Place 1 spray into both nostrils daily. 12/15/16   Oneta Rack, NP  gabapentin (NEURONTIN) 100 MG capsule Take 2 capsules (200 mg total) by mouth 2 (two) times daily. 12/14/16   Oneta Rack, NP  hydrOXYzine (ATARAX/VISTARIL) 50 MG tablet Take 1 tablet (50 mg total) by mouth 3 (three) times daily as needed for anxiety or itching. 12/14/16   Oneta Rack, NP  loratadine (CLARITIN) 10 MG tablet Take 1 tablet (10 mg total) by mouth daily. 12/15/16   Oneta Rack, NP  nicotine (NICODERM CQ - DOSED IN MG/24  HOURS) 21 mg/24hr patch Place 1 patch (21 mg total) onto the skin daily. 12/15/16   Oneta Rack, NP  QUEtiapine (SEROQUEL) 50 MG tablet Take 1 tablet (50 mg total) by mouth at bedtime. 12/14/16   Oneta Rack, NP  traZODone (DESYREL) 50 MG tablet Take 1 tablet (50 mg total) by mouth at bedtime as needed for sleep. 12/14/16   Oneta Rack, NP  venlafaxine XR (EFFEXOR-XR) 75 MG 24 hr capsule Take 3 capsules (225 mg total) by mouth daily with breakfast. 12/15/16   Oneta Rack, NP    Family History Family History  Problem Relation Age of Onset  . Cancer Mother   . Heart attack Father   . Mental illness Sister   . Alcoholism Brother     Social History Social History  Substance Use Topics  . Smoking status: Current Every Day Smoker    Packs/day: 1.00    Years: 38.00    Types: Cigarettes  . Smokeless tobacco: Never Used  . Alcohol use No     Comment: no etoh ffor 20     Allergies   Nuedexta [dextromethorphan-quinidine]   Review of Systems Review of Systems  Constitutional: Negative for chills and fever.  HENT: Negative.   Respiratory: Negative.   Cardiovascular: Negative.   Gastrointestinal: Negative.  Musculoskeletal: Negative.   Skin: Negative.   Neurological: Negative.   Psychiatric/Behavioral: Positive for dysphoric mood and suicidal ideas. Negative for self-injury.     Physical Exam Updated Vital Signs BP 128/87 (BP Location: Right Arm)   Pulse 72   Temp 97.8 F (36.6 C) (Oral)   Resp 17   Ht 5\' 9"  (1.753 m)   Wt 90.7 kg   SpO2 96%   BMI 29.53 kg/m   Physical Exam  Constitutional: He appears well-developed and well-nourished.  HENT:  Head: Normocephalic.  Neck: Normal range of motion. Neck supple.  Cardiovascular: Normal rate and regular rhythm.   Pulmonary/Chest: Effort normal and breath sounds normal.  Abdominal: Soft. Bowel sounds are normal. There is no tenderness. There is no rebound and no guarding.  Musculoskeletal: Normal range of  motion.  Neurological: He is alert. No cranial nerve deficit.  Skin: Skin is warm and dry. No rash noted.  Psychiatric: His speech is normal and behavior is normal. He is not actively hallucinating. He exhibits a depressed mood. He expresses suicidal ideation.     ED Treatments / Results  Labs (all labs ordered are listed, but only abnormal results are displayed) Labs Reviewed  COMPREHENSIVE METABOLIC PANEL  ETHANOL  SALICYLATE LEVEL  ACETAMINOPHEN LEVEL  CBC  RAPID URINE DRUG SCREEN, HOSP PERFORMED    EKG  EKG Interpretation None       Radiology No results found.  Procedures Procedures (including critical care time)  Medications Ordered in ED Medications - No data to display   Initial Impression / Assessment and Plan / ED Course  I have reviewed the triage vital signs and the nursing notes.  Pertinent labs & imaging results that were available during my care of the patient were reviewed by me and considered in my medical decision making (see chart for details).     Patient presents with SI, last evaluation x 1 month ago. He has not taken his medications since that time. No HI or AVH. TTS to evaluate for disposition.   Final Clinical Impressions(s) / ED Diagnoses   Final diagnoses:  None   1. SI 2. Medication noncompliance  New Prescriptions New Prescriptions   No medications on file     Elpidio AnisShari Dezaray Shibuya, PA-C 01/03/17 0135    Alvira MondayErin Schlossman, MD 01/05/17 1347

## 2017-01-03 NOTE — ED Triage Notes (Signed)
Pt presents to ED by [facility] for evaluation of SI that began 1 hr ago. Pt states that he has not taken his medication in over a month, pt uncertain of what medication he is supposed to take. Pt stated that he would step out in front of a bus.

## 2017-01-03 NOTE — BH Assessment (Signed)
[facility] Assessment Progress Note  Per Thedore MinsMojeed Akintayo, MD, this pt requires psychiatric hospitalization at this time.  At 13:03 Christiane HaJonathan [facility].  Pt has been accepted to their facility by Dr Juleen ChinaKohut.  EDP Raeford RazorStephen Kohut, MD, concurs with this disposition, as does the pt who is currently under voluntary status.  Pt's nurse has been notified, and agrees to call report to [phone].  [facility].  Rebie Peale, MA Triage Specialist [phone]

## 2017-04-10 ENCOUNTER — Encounter (HOSPITAL_COMMUNITY): Payer: Self-pay

## 2017-04-10 ENCOUNTER — Emergency Department (HOSPITAL_COMMUNITY)
Admission: EM | Admit: 2017-04-10 | Discharge: 2017-04-11 | Disposition: A | Discharge status: Home / Self Care | Payer: Medicare HMO | Attending: Emergency Medicine | Admitting: Emergency Medicine

## 2017-04-10 DIAGNOSIS — F4323 Adjustment disorder with mixed anxiety and depressed mood: Secondary | ICD-10-CM | POA: Insufficient documentation

## 2017-04-10 DIAGNOSIS — F17210 Nicotine dependence, cigarettes, uncomplicated: Secondary | ICD-10-CM | POA: Insufficient documentation

## 2017-04-10 DIAGNOSIS — R45851 Suicidal ideations: Secondary | ICD-10-CM | POA: Diagnosis present

## 2017-04-10 DIAGNOSIS — Z79899 Other long term (current) drug therapy: Secondary | ICD-10-CM | POA: Insufficient documentation

## 2017-04-10 DIAGNOSIS — F4310 Post-traumatic stress disorder, unspecified: Secondary | ICD-10-CM | POA: Insufficient documentation

## 2017-04-10 LAB — COMPREHENSIVE METABOLIC PANEL
ALK PHOS: 69 U/L (ref 38–126)
ALT: 25 U/L (ref 17–63)
AST: 25 U/L (ref 15–41)
Albumin: 4.6 g/dL (ref 3.5–5.0)
Anion gap: 7 (ref 5–15)
BUN: 22 mg/dL — AB (ref 6–20)
CALCIUM: 9.1 mg/dL (ref 8.9–10.3)
CHLORIDE: 109 mmol/L (ref 101–111)
CO2: 26 mmol/L (ref 22–32)
CREATININE: 1.02 mg/dL (ref 0.61–1.24)
GFR calc non Af Amer: >60 mL/min (ref >60)
Glucose, Bld: 93 mg/dL (ref 65–99)
Potassium: 4 mmol/L (ref 3.5–5.1)
Sodium: 142 mmol/L (ref 135–145)
Total Bilirubin: 0.5 mg/dL (ref 0.3–1.2)
Total Protein: 7.5 g/dL (ref 6.5–8.1)

## 2017-04-10 LAB — CBC
HCT: 43.2 % (ref 39.0–52.0)
Hemoglobin: 14.7 g/dL (ref 13.0–17.0)
MCH: 29.9 pg (ref 26.0–34.0)
MCHC: 34 g/dL (ref 30.0–36.0)
MCV: 88 fL (ref 78.0–100.0)
PLATELETS: 249 10*3/uL (ref 150–400)
RBC: 4.91 MIL/uL (ref 4.22–5.81)
RDW: 14.8 % (ref 11.5–15.5)
WBC: 11.3 10*3/uL — ABNORMAL HIGH (ref 4.0–10.5)

## 2017-04-10 LAB — LITHIUM LEVEL

## 2017-04-10 LAB — ACETAMINOPHEN LEVEL: Acetaminophen (Tylenol), Serum: <10 ug/mL — ABNORMAL LOW (ref 10–30)

## 2017-04-10 LAB — ETHANOL

## 2017-04-10 LAB — SALICYLATE LEVEL

## 2017-04-10 MED ORDER — FLUTICASONE PROPIONATE 50 MCG/ACT NA SUSP
1.0000 | Freq: Every day | NASAL | Status: DC
  Filled 2017-04-10: qty 16

## 2017-04-10 MED ORDER — VENLAFAXINE HCL ER 75 MG PO CP24
225.0000 mg | ORAL_CAPSULE | Freq: Every day | ORAL | Status: DC
  Administered 2017-04-11: 225 mg via ORAL
  Filled 2017-04-10: qty 3

## 2017-04-10 MED ORDER — LORATADINE 10 MG PO TABS
10.0000 mg | ORAL_TABLET | Freq: Every day | ORAL | Status: DC
  Administered 2017-04-11: 10 mg via ORAL
  Filled 2017-04-10: qty 1

## 2017-04-10 MED ORDER — NICOTINE 21 MG/24HR TD PT24
21.0000 mg | MEDICATED_PATCH | Freq: Every day | TRANSDERMAL | Status: DC
  Administered 2017-04-11: 21 mg via TRANSDERMAL
  Filled 2017-04-10: qty 1

## 2017-04-10 MED ORDER — GABAPENTIN 100 MG PO CAPS
200.0000 mg | ORAL_CAPSULE | Freq: Two times a day (BID) | ORAL | Status: DC
  Administered 2017-04-11 (×2): 200 mg via ORAL
  Filled 2017-04-10 (×2): qty 2

## 2017-04-10 MED ORDER — TRAZODONE HCL 50 MG PO TABS: 50.0000 mg | ORAL_TABLET | Freq: Every evening | ORAL | Status: DC | PRN

## 2017-04-10 MED ORDER — QUETIAPINE FUMARATE 50 MG PO TABS
50.0000 mg | ORAL_TABLET | Freq: Every day | ORAL | Status: DC
  Administered 2017-04-11: 50 mg via ORAL
  Filled 2017-04-10: qty 1

## 2017-04-10 MED ORDER — HYDROXYZINE HCL 25 MG PO TABS: 50.0000 mg | ORAL_TABLET | Freq: Three times a day (TID) | ORAL | Status: DC | PRN

## 2017-04-10 NOTE — ED Triage Notes (Signed)
States broke up wife about a week ago and now feels suicidal with no plan voiced alert and oriented x 3.

## 2017-04-10 NOTE — ED Provider Notes (Signed)
[facility] DEPT Provider Note   CSN: [id number] Arrival date & time: 04/10/17  2201     History   Chief Complaint Chief Complaint  Patient presents with  . Suicidal    HPI Anthony [name] is a 56 y.o. male.  HPI  Patient presents with suicidal ideation currently patient acknowledges history of depression, anxiety, states that his hospitalist about 3 months ago for depression and anxiety. He notes over the past week his wife has stopped talking to him, and they separated. Today the patient felt suicidal ideation. He specifically denies any other complaint, including any physical pain, nausea, fever, cough. He states that he takes all medication as directed, including multiple antidepressants   Past Medical History:  Diagnosis Date  . Bell's palsy   . Depression   . Post traumatic stress disorder   . Seizures (HCC)    last seizure 12 yrs ago    Patient Active Problem List   Diagnosis Date Noted  . Cocaine use disorder, severe, dependence (HCC) 01/03/2017  . 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 Major depressive disorder, recurrent severe without psychotic features (HCC) 12/09/2016  . Allergic reaction   . Rash   . Suicidal ideation   . PTSD (post-traumatic stress disorder) 09/12/2014  . Episodic mood disorder (HCC) 09/11/2014    Past Surgical History:  Procedure Laterality Date  . APPENDECTOMY    . brain damage    . CHOLECYSTECTOMY    . FOOT SURGERY Bilateral   . KNEE SURGERY Right        Home Medications    Prior to Admission medications   Medication Sig Start Date End Date Taking? Authorizing Provider  fluticasone (FLONASE) 50 MCG/ACT nasal spray Place 1 spray into both nostrils daily. Patient not taking: Reported on 01/03/2017 12/15/16   Oneta Rack, NP  gabapentin (NEURONTIN) 100 MG capsule Take 2 capsules (200 mg total) by mouth 2 (two) times daily. Patient not taking: Reported on 01/03/2017 12/14/16   Oneta Rack, NP  hydrOXYzine (ATARAX/VISTARIL)  50 MG tablet Take 1 tablet (50 mg total) by mouth 3 (three) times daily as needed for anxiety or itching. Patient not taking: Reported on 01/03/2017 12/14/16   Oneta Rack, NP  loratadine (CLARITIN) 10 MG tablet Take 1 tablet (10 mg total) by mouth daily. Patient not taking: Reported on 01/03/2017 12/15/16   Oneta Rack, NP  nicotine (NICODERM CQ - DOSED IN MG/24 HOURS) 21 mg/24hr patch Place 1 patch (21 mg total) onto the skin daily. Patient not taking: Reported on 01/03/2017 12/15/16   Oneta Rack, NP  QUEtiapine (SEROQUEL) 50 MG tablet Take 1 tablet (50 mg total) by mouth at bedtime. Patient not taking: Reported on 01/03/2017 12/14/16   Oneta Rack, NP  traZODone (DESYREL) 50 MG tablet Take 1 tablet (50 mg total) by mouth at bedtime as needed for sleep. Patient not taking: Reported on 01/03/2017 12/14/16   Oneta Rack, NP  venlafaxine XR (EFFEXOR-XR) 75 MG 24 hr capsule Take 3 capsules (225 mg total) by mouth daily with breakfast. Patient not taking: Reported on 01/03/2017 12/15/16   Oneta Rack, NP    Family History Family History  Problem Relation Age of Onset  . Cancer Mother   . Heart attack Father   . Mental illness Sister   . Alcoholism Brother     Social History Social History  Substance Use Topics  . Smoking status: Current Every Day Smoker    Packs/day: 1.00  Years: 38.00    Types: Cigarettes  . Smokeless tobacco: Never Used  . Alcohol use No     Comment: no etoh ffor 20     Allergies   Nuedexta [dextromethorphan-quinidine]   Review of Systems Review of Systems  Constitutional:       Per HPI, otherwise negative  HENT:       Per HPI, otherwise negative  Respiratory:       Per HPI, otherwise negative  Cardiovascular:       Per HPI, otherwise negative  Gastrointestinal: Negative for vomiting.  Endocrine:       Negative aside from HPI  Genitourinary:       Neg aside from HPI   Musculoskeletal:       Per HPI, otherwise negative  Skin: Negative.    Neurological: Negative for syncope.  Psychiatric/Behavioral: Positive for confusion, decreased concentration, dysphoric mood and suicidal ideas. The patient is nervous/anxious.      Physical Exam Updated Vital Signs BP 140/90   Pulse 88   Temp 98.1 F (36.7 C) (Oral)   Resp 18   Ht 5\' 9"  (1.753 m)   Wt 200 lb (90.7 kg)   SpO2 96%   BMI 29.53 kg/m   Physical Exam  Constitutional: He is oriented to person, place, and time. He appears well-developed.  Anxious large M awake, alert, interactive  HENT:  Head: Normocephalic and atraumatic.  Eyes: Conjunctivae and EOM are normal.  Cardiovascular: Normal rate and regular rhythm.   Pulmonary/Chest: Effort normal. No stridor. No respiratory distress.  Abdominal: He exhibits no distension.  Musculoskeletal: He exhibits no edema.  Neurological: He is alert and oriented to person, place, and time.  Skin: Skin is warm and dry.  Psychiatric: His mood appears anxious. He is slowed. He expresses suicidal ideation. He is inattentive.  Nursing note and vitals reviewed.    ED Treatments / Results  Labs (all labs ordered are listed, but only abnormal results are displayed) Labs Reviewed  COMPREHENSIVE METABOLIC PANEL - Abnormal; Notable for the following:       Result Value   BUN 22 (*)    All other components within normal limits  ACETAMINOPHEN LEVEL - Abnormal; Notable for the following:    Acetaminophen (Tylenol), Serum <10 (*)    All other components within normal limits  CBC - Abnormal; Notable for the following:    WBC 11.3 (*)    All other components within normal limits  LITHIUM LEVEL - Abnormal; Notable for the following:    Lithium Lvl <0.06 (*)    All other components within normal limits  ETHANOL  SALICYLATE LEVEL  RAPID URINE DRUG SCREEN, HOSP PERFORMED     EMR review notable for psych hospitalization 3 m PTA.  Procedures Procedures (including critical care time)  Medications Ordered in ED Medications    fluticasone (FLONASE) 50 MCG/ACT nasal spray 1 spray (not administered)  gabapentin (NEURONTIN) capsule 200 mg (not administered)  hydrOXYzine (ATARAX/VISTARIL) tablet 50 mg (not administered)  loratadine (CLARITIN) tablet 10 mg (not administered)  nicotine (NICODERM CQ - dosed in mg/24 hours) patch 21 mg (not administered)  QUEtiapine (SEROQUEL) tablet 50 mg (not administered)  traZODone (DESYREL) tablet 50 mg (not administered)  venlafaxine XR (EFFEXOR-XR) 24 hr capsule 225 mg (not administered)     Initial Impression / Assessment and Plan / ED Course  I have reviewed the triage vital signs and the nursing notes.  Pertinent labs & imaging results that were available during my care  of the patient were reviewed by me and considered in my medical decision making (see chart for details).  Patient medically clear for psychiatric evaluation and I have discussed this case with our behavioral health counselor. With concern for suicidal ideation, possible homicidal ideation, patient requires additional evaluation.   Final Clinical Impressions(s) / ED Diagnoses  Suicidal ideation   Gerhard Munch, MD 04/11/17 0021

## 2017-04-10 NOTE — ED Notes (Signed)
Bed: [facility] Expected date:  Expected time:  Means of arrival:  Comments: 

## 2017-04-11 DIAGNOSIS — F4323 Adjustment disorder with mixed anxiety and depressed mood: Secondary | ICD-10-CM | POA: Diagnosis not present

## 2017-04-11 LAB — RAPID URINE DRUG SCREEN, HOSP PERFORMED
AMPHETAMINES: NOT DETECTED
BARBITURATES: NOT DETECTED
BENZODIAZEPINES: NOT DETECTED
Cocaine: NOT DETECTED
OPIATES: NOT DETECTED
TETRAHYDROCANNABINOL: NOT DETECTED

## 2017-04-11 NOTE — ED Notes (Signed)
Bed: [facility] Expected date:  Expected time:  Means of arrival:  Comments: 

## 2017-04-11 NOTE — [facility] Suicide Risk Assessment (Signed)
Suicide Risk Assessment  Discharge Assessment   [facility]   Principal Problem: Adjustment disorder with mixed anxiety and depressed mood Discharge Diagnoses:  Patient Active Problem List   Diagnosis Date Noted  . Adjustment disorder with mixed anxiety and depressed mood [F43.23] 04/11/2017    Priority: High  . PTSD (post-traumatic stress disorder) [F43.10] 09/12/2014    Priority: High  . Cocaine use disorder, severe, dependence (HCC) [F14.20] 01/03/2017  . 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 Major depressive disorder, recurrent severe without psychotic features (HCC) [F33.2] 12/09/2016  . Allergic reaction [T78.40XA]   . Rash [R21]   . Suicidal ideation [R45.851]   . Episodic mood disorder (HCC) [F39] 09/11/2014    Total Time spent with patient: 45 minutes  Musculoskeletal: Strength & Muscle Tone: within normal limits Gait & Station: normal Patient leans: N/A  Psychiatric Specialty Exam:   Blood pressure 126/73, pulse 86, temperature 97.7 F (36.5 C), temperature source Oral, resp. rate 20, height 5\' 9"  (1.753 m), weight 90.7 kg (200 lb), SpO2 95 %.Body mass index is 29.53 kg/m.  General Appearance: Casual  Eye Contact::  Good  Speech:  Normal Rate409  Volume:  Normal  Mood:  Depressed, mild  Affect:  Congruent  Thought Process:  Coherent and Descriptions of Associations: Intact  Orientation:  Full (Time, Place, and Person)  Thought Content:  WDL and Logical  Suicidal Thoughts:  No  Homicidal Thoughts:  No  Memory:  Immediate;   Good Recent;   Good Remote;   Good  Judgement:  Fair  Insight:  Fair  Psychomotor Activity:  Normal  Concentration:  Good  Recall:  NA  Fund of Knowledge:Fair  Language: Good  Akathisia:  No  Handed:  Right  AIMS (if indicated):     Assets:  Leisure Time Physical Health Resilience Social Support  Sleep:     Cognition: WNL  ADL's:  Intact   Mental Status Per Nursing Assessment::   On Admission:    suicidal/homicidal ideations, just left [facility] on 4/30 after being admitted.  Cocaine and alcohol abuse is typical but not this time.  He became upset his wife left him for someone else, no longer feels suicidal or homicidal but duty to warn initiated.   Demographic Factors:  Male and Caucasian  Loss Factors: Loss of significant relationship  Historical Factors: NA  Risk Reduction Factors:   Sense of responsibility to family, Positive social support and Positive therapeutic relationship  Continued Clinical Symptoms:  Depression, mild  Cognitive Features That Contribute To Risk:  None    Suicide Risk:  Minimal: No identifiable suicidal ideation.  Patients presenting with no risk factors but with morbid ruminations; may be classified as minimal risk based on the severity of the depressive symptoms    Plan Of Care/Follow-up recommendations:  Activity:  as tolerated Diet:  heart healhty diet  LORD, JAMISON, NP 04/11/2017, 11:14 AM
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
## 2017-04-11 NOTE — ED Notes (Signed)
No respiratory or acute distress noted alert and oriented x 3 call light in reach. 
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
## 2017-04-11 NOTE — Discharge Instructions (Signed)
For your ongoing behavioral health needs, [facility] are [facility]:       [facility]      [address]      [place], [place] [address]      [phone]

## 2017-04-11 NOTE — ED Notes (Signed)
No respiratory or acute distress noted alert and oriented x 3 food and drink given no complaint of pain voiced.

## 2017-04-11 NOTE — BH Assessment (Signed)
[facility] Assessment Progress Note  Per Thedore MinsMojeed Akintayo, MD, this pt does not require psychiatric hospitalization at this time.  [facility].  This has been included in pt's discharge instructions.  Pt's nurse has been notified.  Pt's initial complaint included thoughts of killing his wife and her alleged boyfriend.  However, at no point did he divulge their names or contact information, and these details are not found in pt's EPIC record.  [place] [employer], [facility], have been informed of these details.   Doylene Canninghomas Jemiah Cuadra, MA Triage Specialist [phone]

## 2017-04-29 DIAGNOSIS — L03115 Cellulitis of right lower limb: Secondary | ICD-10-CM

## 2017-04-29 DIAGNOSIS — A419 Sepsis, unspecified organism: Secondary | ICD-10-CM

## 2017-04-29 HISTORY — DX: Sepsis, unspecified organism: A41.9

## 2017-04-29 HISTORY — DX: Cellulitis of right lower limb: L03.115

## 2017-09-10 ENCOUNTER — Emergency Department (HOSPITAL_COMMUNITY): Payer: Medicare HMO

## 2017-09-10 ENCOUNTER — Encounter (HOSPITAL_COMMUNITY): Payer: Self-pay | Admitting: Emergency Medicine

## 2017-09-10 DIAGNOSIS — J40 Bronchitis, not specified as acute or chronic: Secondary | ICD-10-CM | POA: Insufficient documentation

## 2017-09-10 DIAGNOSIS — M25512 Pain in left shoulder: Secondary | ICD-10-CM | POA: Diagnosis present

## 2017-09-10 DIAGNOSIS — G8929 Other chronic pain: Secondary | ICD-10-CM | POA: Insufficient documentation

## 2017-09-10 DIAGNOSIS — R45851 Suicidal ideations: Secondary | ICD-10-CM | POA: Diagnosis not present

## 2017-09-10 DIAGNOSIS — F17210 Nicotine dependence, cigarettes, uncomplicated: Secondary | ICD-10-CM | POA: Diagnosis not present

## 2017-09-10 DIAGNOSIS — Z79899 Other long term (current) drug therapy: Secondary | ICD-10-CM | POA: Diagnosis not present

## 2017-09-10 LAB — BASIC METABOLIC PANEL
Anion gap: 9 (ref 5–15)
BUN: 18 mg/dL (ref 6–20)
CALCIUM: 9.1 mg/dL (ref 8.9–10.3)
CHLORIDE: 103 mmol/L (ref 101–111)
CO2: 25 mmol/L (ref 22–32)
Creatinine, Ser: 0.93 mg/dL (ref 0.61–1.24)
GFR calc Af Amer: >60 mL/min (ref >60)
GFR calc non Af Amer: >60 mL/min (ref >60)
GLUCOSE: 83 mg/dL (ref 65–99)
Potassium: 4.1 mmol/L (ref 3.5–5.1)
Sodium: 137 mmol/L (ref 135–145)

## 2017-09-10 LAB — CBC
HEMATOCRIT: 42.1 % (ref 39.0–52.0)
HEMOGLOBIN: 14 g/dL (ref 13.0–17.0)
MCH: 28.5 pg (ref 26.0–34.0)
MCHC: 33.3 g/dL (ref 30.0–36.0)
MCV: 85.7 fL (ref 78.0–100.0)
Platelets: 289 10*3/uL (ref 150–400)
RBC: 4.91 MIL/uL (ref 4.22–5.81)
RDW: 15.4 % (ref 11.5–15.5)
WBC: 11.7 10*3/uL — ABNORMAL HIGH (ref 4.0–10.5)

## 2017-09-10 LAB — I-STAT TROPONIN, ED: Troponin i, poc: 0 ng/mL (ref 0.00–0.08)

## 2017-09-10 NOTE — ED Triage Notes (Signed)
Pt transported from [place] after walking around furniture market began having chest pain, pt refused IV in route, pt refused [facility].  Per EMS pt states he got cold last night and he has no where to sleep tonight.  midsternal tightness, +cough, no other reports of n/v, no shob, A & O .

## 2017-09-11 ENCOUNTER — Emergency Department (HOSPITAL_COMMUNITY)
Admission: EM | Admit: 2017-09-11 | Discharge: 2017-09-11 | Disposition: A | Discharge status: Home / Self Care | Payer: Medicare HMO | Source: Home / Self Care | Attending: Emergency Medicine | Admitting: Emergency Medicine

## 2017-09-11 ENCOUNTER — Emergency Department (HOSPITAL_COMMUNITY)
Admission: EM | Admit: 2017-09-11 | Discharge: 2017-09-12 | Disposition: A | Discharge status: Other Acute Inpatient Hospital | Payer: Medicare HMO | Attending: Emergency Medicine | Admitting: Emergency Medicine

## 2017-09-11 ENCOUNTER — Encounter (HOSPITAL_COMMUNITY): Payer: Self-pay | Admitting: Emergency Medicine

## 2017-09-11 DIAGNOSIS — M25512 Pain in left shoulder: Secondary | ICD-10-CM

## 2017-09-11 DIAGNOSIS — R45851 Suicidal ideations: Secondary | ICD-10-CM

## 2017-09-11 DIAGNOSIS — R079 Chest pain, unspecified: Secondary | ICD-10-CM

## 2017-09-11 DIAGNOSIS — J40 Bronchitis, not specified as acute or chronic: Secondary | ICD-10-CM

## 2017-09-11 DIAGNOSIS — G8929 Other chronic pain: Secondary | ICD-10-CM

## 2017-09-11 DIAGNOSIS — F314 Bipolar disorder, current episode depressed, severe, without psychotic features: Secondary | ICD-10-CM | POA: Diagnosis present

## 2017-09-11 HISTORY — DX: Homelessness unspecified: Z59.00

## 2017-09-11 HISTORY — DX: Encephalitis and encephalomyelitis, unspecified: G04.90

## 2017-09-11 HISTORY — DX: Sepsis, unspecified organism: A41.9

## 2017-09-11 HISTORY — DX: Pain in left shoulder: M25.512

## 2017-09-11 HISTORY — DX: Cannabis abuse, uncomplicated: F12.10

## 2017-09-11 HISTORY — DX: Unspecified mood (affective) disorder: F39

## 2017-09-11 HISTORY — DX: Homelessness: Z59.0

## 2017-09-11 HISTORY — DX: Other chronic pain: G89.29

## 2017-09-11 HISTORY — DX: Cellulitis of right lower limb: L03.115

## 2017-09-11 LAB — COMPREHENSIVE METABOLIC PANEL
ALBUMIN: 4 g/dL (ref 3.5–5.0)
ALK PHOS: 67 U/L (ref 38–126)
ALT: 26 U/L (ref 17–63)
AST: 35 U/L (ref 15–41)
Anion gap: 11 (ref 5–15)
BILIRUBIN TOTAL: 0.7 mg/dL (ref 0.3–1.2)
BUN: 18 mg/dL (ref 6–20)
CALCIUM: 8.9 mg/dL (ref 8.9–10.3)
CO2: 20 mmol/L — ABNORMAL LOW (ref 22–32)
Chloride: 107 mmol/L (ref 101–111)
Creatinine, Ser: 0.99 mg/dL (ref 0.61–1.24)
GFR calc Af Amer: >60 mL/min (ref >60)
GFR calc non Af Amer: >60 mL/min (ref >60)
GLUCOSE: 140 mg/dL — AB (ref 65–99)
Potassium: 4.8 mmol/L (ref 3.5–5.1)
Sodium: 138 mmol/L (ref 135–145)
Total Protein: 6.8 g/dL (ref 6.5–8.1)

## 2017-09-11 LAB — RAPID URINE DRUG SCREEN, HOSP PERFORMED
Amphetamines: NOT DETECTED
BARBITURATES: NOT DETECTED
BENZODIAZEPINES: NOT DETECTED
Cocaine: NOT DETECTED
Opiates: NOT DETECTED
Tetrahydrocannabinol: POSITIVE — AB

## 2017-09-11 LAB — CBC
HEMATOCRIT: 41.7 % (ref 39.0–52.0)
Hemoglobin: 13.8 g/dL (ref 13.0–17.0)
MCH: 28.3 pg (ref 26.0–34.0)
MCHC: 33.1 g/dL (ref 30.0–36.0)
MCV: 85.6 fL (ref 78.0–100.0)
Platelets: 276 10*3/uL (ref 150–400)
RBC: 4.87 MIL/uL (ref 4.22–5.81)
RDW: 15.4 % (ref 11.5–15.5)
WBC: 7.2 10*3/uL (ref 4.0–10.5)

## 2017-09-11 LAB — ACETAMINOPHEN LEVEL: Acetaminophen (Tylenol), Serum: <10 ug/mL — ABNORMAL LOW (ref 10–30)

## 2017-09-11 LAB — SALICYLATE LEVEL: Salicylate Lvl: <7 mg/dL (ref 2.8–30.0)

## 2017-09-11 LAB — ETHANOL: Alcohol, Ethyl (B): <10 mg/dL (ref <10)

## 2017-09-11 MED ORDER — IPRATROPIUM BROMIDE 0.02 % IN SOLN
0.5000 mg | Freq: Once | RESPIRATORY_TRACT | Status: AC
  Administered 2017-09-11: 0.5 mg via RESPIRATORY_TRACT
  Filled 2017-09-11: qty 2.5

## 2017-09-11 MED ORDER — IBUPROFEN 400 MG PO TABS: 600.0000 mg | ORAL_TABLET | Freq: Three times a day (TID) | ORAL | Status: DC | PRN

## 2017-09-11 MED ORDER — NICOTINE 21 MG/24HR TD PT24
21.0000 mg | MEDICATED_PATCH | Freq: Every day | TRANSDERMAL | Status: DC
  Administered 2017-09-11 – 2017-09-12 (×2): 21 mg via TRANSDERMAL
  Filled 2017-09-11 (×2): qty 1

## 2017-09-11 MED ORDER — AZITHROMYCIN 250 MG PO TABS: 250.0000 mg | ORAL_TABLET | Freq: Every day | ORAL | 0 refills | Status: DC

## 2017-09-11 MED ORDER — QUETIAPINE FUMARATE 50 MG PO TABS
50.0000 mg | ORAL_TABLET | Freq: Every day | ORAL | Status: DC
  Administered 2017-09-11: 50 mg via ORAL
  Filled 2017-09-11: qty 1

## 2017-09-11 MED ORDER — HYDROXYZINE HCL 50 MG PO TABS
50.0000 mg | ORAL_TABLET | Freq: Three times a day (TID) | ORAL | Status: DC | PRN
  Administered 2017-09-11: 50 mg via ORAL
  Filled 2017-09-11: qty 1

## 2017-09-11 MED ORDER — ALBUTEROL SULFATE HFA 108 (90 BASE) MCG/ACT IN AERS: 1.0000 | INHALATION_SPRAY | Freq: Four times a day (QID) | RESPIRATORY_TRACT | Status: DC | PRN

## 2017-09-11 MED ORDER — PREDNISONE 20 MG PO TABS: ORAL_TABLET | ORAL | 0 refills | Status: DC

## 2017-09-11 MED ORDER — LORATADINE 10 MG PO TABS
10.0000 mg | ORAL_TABLET | Freq: Every day | ORAL | Status: DC
  Administered 2017-09-11 – 2017-09-12 (×2): 10 mg via ORAL
  Filled 2017-09-11 (×2): qty 1

## 2017-09-11 MED ORDER — AZITHROMYCIN 250 MG PO TABS
500.0000 mg | ORAL_TABLET | Freq: Once | ORAL | Status: AC
  Administered 2017-09-11: 500 mg via ORAL
  Filled 2017-09-11: qty 2

## 2017-09-11 MED ORDER — PREDNISONE 20 MG PO TABS
60.0000 mg | ORAL_TABLET | Freq: Once | ORAL | Status: AC
  Administered 2017-09-11: 60 mg via ORAL
  Filled 2017-09-11: qty 3

## 2017-09-11 MED ORDER — PREDNISONE 20 MG PO TABS
40.0000 mg | ORAL_TABLET | Freq: Every day | ORAL | Status: DC
  Administered 2017-09-12: 40 mg via ORAL
  Filled 2017-09-11 (×2): qty 2

## 2017-09-11 MED ORDER — GABAPENTIN 300 MG PO CAPS
300.0000 mg | ORAL_CAPSULE | Freq: Two times a day (BID) | ORAL | Status: DC
  Administered 2017-09-11 – 2017-09-12 (×3): 300 mg via ORAL
  Filled 2017-09-11 (×3): qty 1

## 2017-09-11 MED ORDER — FLUTICASONE PROPIONATE 50 MCG/ACT NA SUSP
1.0000 | Freq: Every day | NASAL | Status: DC
  Administered 2017-09-11 – 2017-09-12 (×2): 1 via NASAL
  Filled 2017-09-11: qty 16

## 2017-09-11 MED ORDER — ALBUTEROL SULFATE HFA 108 (90 BASE) MCG/ACT IN AERS
2.0000 | INHALATION_SPRAY | Freq: Once | RESPIRATORY_TRACT | Status: AC
  Administered 2017-09-11: 2 via RESPIRATORY_TRACT
  Filled 2017-09-11: qty 6.7

## 2017-09-11 MED ORDER — VENLAFAXINE HCL ER 75 MG PO CP24
225.0000 mg | ORAL_CAPSULE | Freq: Every day | ORAL | Status: DC
  Administered 2017-09-12: 09:00:00 225 mg via ORAL
  Filled 2017-09-11: qty 1

## 2017-09-11 MED ORDER — ONDANSETRON HCL 4 MG PO TABS
4.0000 mg | ORAL_TABLET | Freq: Three times a day (TID) | ORAL | Status: DC | PRN
  Administered 2017-09-11: 4 mg via ORAL
  Filled 2017-09-11: qty 1

## 2017-09-11 MED ORDER — ALUM & MAG HYDROXIDE-SIMETH 200-200-20 MG/5ML PO SUSP
30.0000 mL | Freq: Four times a day (QID) | ORAL | Status: DC | PRN
  Administered 2017-09-11: 30 mL via ORAL
  Filled 2017-09-11: qty 30

## 2017-09-11 MED ORDER — ALBUTEROL SULFATE (2.5 MG/3ML) 0.083% IN NEBU
5.0000 mg | INHALATION_SOLUTION | Freq: Once | RESPIRATORY_TRACT | Status: AC
Start: 2017-09-11 — End: 2017-09-11
  Administered 2017-09-11: 5 mg via RESPIRATORY_TRACT
  Filled 2017-09-11: qty 6

## 2017-09-11 MED ORDER — FLUTICASONE FUROATE-VILANTEROL 200-25 MCG/INH IN AEPB
1.0000 | INHALATION_SPRAY | Freq: Every day | RESPIRATORY_TRACT | Status: DC
  Administered 2017-09-11 – 2017-09-12 (×2): 1 via RESPIRATORY_TRACT
  Filled 2017-09-11: qty 28

## 2017-09-11 NOTE — ED Notes (Addendum)
Pt ambulatory to F11 wearing burgundy scrubs and eyeglasses - per report, pt's belongings inventoried. Pt signed Medical Clearance Pt Policy form - copy given to pt.

## 2017-09-11 NOTE — ED Triage Notes (Signed)
Pt. Wanded, orders in for a sitter, charge informed.

## 2017-09-11 NOTE — ED Triage Notes (Signed)
Pt. Stated, I want to kill myself , I thought about walking out in front of a bus and drink something poison and decided to come here. Pt. Has tried 3 other times.  I also have left shoulder pain and that's why I smoke weed.

## 2017-09-11 NOTE — ED Notes (Signed)
ED Provider at bedside. 

## 2017-09-11 NOTE — ED Triage Notes (Signed)
Pt. Stated, I was in [facility] and I liked it there, I want to go back there. I think I will retire there.

## 2017-09-11 NOTE — ED Notes (Signed)
Eating snack given. 

## 2017-09-11 NOTE — ED Provider Notes (Signed)
[facility] DEPT Provider Note   CSN: [id number] Arrival date & time: 09/11/17  4098     History   Chief Complaint Chief Complaint  Patient presents with  . Suicidal  . Shoulder Pain    HPI Anthony [name] is a 56 y.o. male.  Patient with history of chronic shoulder pain, substance abuse, reported suicide attempt by overdose about 10 years ago -- presents with complaint of ongoing chronic left shoulder pain as well as suicidality. Patient states that for the past week and a half he has been thinking about walking in front of traffic or hanging himself. He states that he is currently homeless and does not have a place to live. Also states that he did not receive a check last month. Reports hospitalization several months ago for cellulitis from which she has recovered. Patient was also seen last night for shortness of breath and wheezing and was treated with albuterol improvement. Denies any othercurrent health problems. Reports smoking cigarettes and marijuana. Denies alcohol or drugs at the current time. The onset of this condition was acute. The course is constant. Aggravating factors: none. Alleviating factors: none.        Past Medical History:  Diagnosis Date  . Bell's palsy   . Depression   . Post traumatic stress disorder   . Seizures (HCC)    last seizure 12 yrs ago    Patient Active Problem List   Diagnosis Date Noted  . Adjustment disorder with mixed anxiety and depressed mood 04/11/2017  . Cocaine use disorder, severe, dependence (HCC) 01/03/2017  . 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 Major depressive disorder, recurrent severe without psychotic features (HCC) 12/09/2016  . Allergic reaction   . Rash   . Suicidal ideation   . PTSD (post-traumatic stress disorder) 09/12/2014  . Episodic mood disorder (HCC) 09/11/2014    Past Surgical History:  Procedure Laterality Date  . APPENDECTOMY    . brain damage    . CHOLECYSTECTOMY    . FOOT SURGERY Bilateral   .  KNEE SURGERY Right        Home Medications    Prior to Admission medications   Medication Sig Start Date End Date Taking? Authorizing Provider  azithromycin (ZITHROMAX) 250 MG tablet Take 1 tablet (250 mg total) by mouth daily. Take first 2 tablets together, then 1 every day until finished. 09/11/17   Garlon Hatchet, PA-C  fluticasone (FLONASE) 50 MCG/ACT nasal spray Place 1 spray into both nostrils daily. Patient not taking: Reported on 01/03/2017 12/15/16   Oneta Rack, NP  gabapentin (NEURONTIN) 100 MG capsule Take 2 capsules (200 mg total) by mouth 2 (two) times daily. Patient not taking: Reported on 01/03/2017 12/14/16   Oneta Rack, NP  gabapentin (NEURONTIN) 300 MG capsule Take 300 mg by mouth 3 (three) times daily. 03/28/17 04/27/17  [provider]  hydrOXYzine (ATARAX/VISTARIL) 50 MG tablet Take 1 tablet (50 mg total) by mouth 3 (three) times daily as needed for anxiety or itching. Patient not taking: Reported on 01/03/2017 12/14/16   Oneta Rack, NP  loratadine (CLARITIN) 10 MG tablet Take 1 tablet (10 mg total) by mouth daily. Patient not taking: Reported on 01/03/2017 12/15/16   Oneta Rack, NP  nicotine (NICODERM CQ - DOSED IN MG/24 HOURS) 21 mg/24hr patch Place 1 patch (21 mg total) onto the skin daily. Patient not taking: Reported on 01/03/2017 12/15/16   Oneta Rack, NP  predniSONE (DELTASONE) 20 MG tablet Take 40 mg by  mouth daily for 3 days, then  by mouth daily for 3 days, then  daily for 3 days 09/11/17   Garlon Hatchet, PA-C  QUEtiapine (SEROQUEL) 50 MG tablet Take 1 tablet (50 mg total) by mouth at bedtime. 12/14/16   Oneta Rack, NP  traZODone (DESYREL) 50 MG tablet Take 1 tablet (50 mg total) by mouth at bedtime as needed for sleep. Patient not taking: Reported on 01/03/2017 12/14/16   Oneta Rack, NP  venlafaxine XR (EFFEXOR-XR) 75 MG 24 hr capsule Take 3 capsules (225 mg total) by mouth daily with breakfast. Patient not taking: Reported  on 01/03/2017 12/15/16   Oneta Rack, NP    Family History Family History  Problem Relation Age of Onset  . Cancer Mother   . Heart attack Father   . Mental illness Sister   . Alcoholism Brother     Social History Social History  Substance Use Topics  . Smoking status: Current Every Day Smoker    Packs/day: 1.00    Years: 38.00    Types: Cigarettes  . Smokeless tobacco: Never Used  . Alcohol use No     Comment: no etoh ffor 20     Allergies   Nuedexta [dextromethorphan-quinidine]   Review of Systems Review of Systems  Constitutional: Negative for fever.  HENT: Negative for rhinorrhea and sore throat.   Eyes: Negative for redness.  Respiratory: Positive for wheezing. Negative for cough and shortness of breath.   Cardiovascular: Negative for chest pain.  Gastrointestinal: Negative for abdominal pain, diarrhea, nausea and vomiting.  Genitourinary: Negative for dysuria.  Musculoskeletal: Negative for myalgias.  Skin: Negative for rash.  Neurological: Negative for headaches.  Psychiatric/Behavioral: Positive for suicidal ideas. Negative for behavioral problems, confusion and self-injury.     Physical Exam Updated Vital Signs BP 121/70 (BP Location: Left Arm)   Pulse 98   Temp 98.1 F (36.7 C) (Oral)   Resp 17   Ht  (1.753 m)   Wt 93.4 kg (206 lb)   SpO2 99%   BMI 30.42 kg/m   Physical Exam  Constitutional: He appears well-developed and well-nourished.  HENT:  Head: Normocephalic and atraumatic.  Mouth/Throat: Oropharynx is clear and moist.  Eyes: Conjunctivae are normal. Right eye exhibits no discharge. Left eye exhibits no discharge.  Neck: Normal range of motion. Neck supple.  Cardiovascular: Normal rate, regular rhythm and normal heart sounds.   Pulmonary/Chest: Effort normal. No respiratory distress. He has wheezes.  Slight wheezing at right base, otherwise clear lung sounds.  Abdominal: Soft. There is no tenderness.  Neurological: He is  alert.  Skin: Skin is warm and dry.  Psychiatric: His speech is normal and behavior is normal. He exhibits a depressed mood. He expresses suicidal ideation. He expresses no homicidal ideation. He expresses suicidal plans. He expresses no homicidal plans.  Nursing note and vitals reviewed.    ED Treatments / Results  Labs (all labs ordered are listed, but only abnormal results are displayed) Labs Reviewed  COMPREHENSIVE METABOLIC PANEL - Abnormal; Notable for the following:       Result Value   CO2 20 (*)    Glucose, Bld 140 (*)    All other components within normal limits  ACETAMINOPHEN LEVEL - Abnormal; Notable for the following:    Acetaminophen (Tylenol), Serum <10 (*)    All other components within normal limits  RAPID URINE DRUG SCREEN, HOSP PERFORMED - Abnormal; Notable for the following:    Tetrahydrocannabinol POSITIVE (*)  All other components within normal limits  ETHANOL  SALICYLATE LEVEL  CBC    EKG  EKG Interpretation None       Radiology Dg Chest 2 View  Result Date: 09/10/2017 CLINICAL DATA:  Chest pain.  Shortness of breath.  Cough. EXAM: CHEST  2 VIEW COMPARISON:  06/05/2017 FINDINGS: Cardiomediastinal silhouette is normal. Mediastinal contours appear intact. There is no evidence of focal airspace consolidation, pleural effusion or pneumothorax. Osseous structures are without acute abnormality. Soft tissues are grossly normal. IMPRESSION: No active cardiopulmonary disease. Electronically Signed   By: Ted Mcalpine M.D.   On: 09/10/2017 20:28    Procedures Procedures (including critical care time)  Medications Ordered in ED Medications  ondansetron (ZOFRAN) tablet 4 mg (not administered)  alum & mag hydroxide-simeth (MAALOX/MYLANTA) 200-200-20 MG/5ML suspension 30 mL (not administered)  nicotine (NICODERM CQ - dosed in mg/24 hours) patch 21 mg (not administered)  ibuprofen (ADVIL,MOTRIN) tablet 600 mg (not administered)     Initial  Impression / Assessment and Plan / ED Course  I have reviewed the triage vital signs and the nursing notes.  Pertinent labs & imaging results that were available during my care of the patient were reviewed by me and considered in my medical decision making (see chart for details).     11:55 AM Patient seen and evaluated. Ongoing attempts are being made to obtain blood. TTS consult ordered. Do not anticipate any medical problems on exam.   2:36 PM Labs reviewed, patient medically cleared.   Home meds ordered. Low concern for bacterial bronchitis or significant COPD exacerbation. Given interaction of Seroquel, d/c azithro that patient was prescribed at previous ED visit.    Final Clinical Impressions(s) / ED Diagnoses   Final diagnoses:  Suicidal ideation  Chronic left shoulder pain   Admit for psychiatric treatment. Pt medically cleared.   New Prescriptions New Prescriptions   No medications on file     Renne Crigler, Cordelia Poche 09/11/17 1438    Little, Ambrose Finland, MD 09/12/17 [phone]

## 2017-09-11 NOTE — ED Notes (Signed)
Called for room X2 with no answer 

## 2017-09-11 NOTE — Discharge Instructions (Signed)
Take the prescribed medication as directed.  Can use albuterol as needed. Follow-up with your primary care doctor. Return to the ED for new or worsening symptoms.
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
## 2017-09-11 NOTE — ED Notes (Signed)
Pt was asleep in the waiting room

## 2017-09-11 NOTE — ED Provider Notes (Signed)
[facility]-EMERGENCY DEPT Provider Note   CSN: [id number] Arrival date & time: 09/10/17  1933     History   Chief Complaint Chief Complaint  Patient presents with  . Chest Pain    HPI Anthony [name] is a 56 y.o. male.  The history is provided by the patient and medical records.    56 year old male with history of depression, PTSD, seizures, adjustment disorder, history of cocaine abuse, mood disorder, seasonal allergies, presenting to the ED with chest pain. Patient reports he has had a productive cough with white sputum for about 2 weeks now. States he coughs nearly all the time. States today he [place] walking around [place] and developed some chest tightness and worsening cough.  States he felt it was due to the cold air as that seems to worsen his symptoms. No diaphoresis, nausea, vomiting, numbness, weakness, dizziness, or feelings of syncope. Denies hx of asthma or COPD but reports he has been wheezing recently.  He smokes 1 PPD.  Reports he used to use albuterol at home but ran out about a month ago.  Denies known cardiac hx.  Patient denies chest tightness or other symptoms at time of my evaluation.  Past Medical History:  Diagnosis Date  . Bell's palsy   . Depression   . Post traumatic stress disorder   . Seizures (HCC)    last seizure 12 yrs ago    Patient Active Problem List   Diagnosis Date Noted  . Adjustment disorder with mixed anxiety and depressed mood 04/11/2017  . Cocaine use disorder, severe, dependence (HCC) 01/03/2017  . 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 Major depressive disorder, recurrent severe without psychotic features (HCC) 12/09/2016  . Allergic reaction   . Rash   . Suicidal ideation   . PTSD (post-traumatic stress disorder) 09/12/2014  . Episodic mood disorder (HCC) 09/11/2014    Past Surgical History:  Procedure Laterality Date  . APPENDECTOMY    . brain damage    . CHOLECYSTECTOMY    . FOOT SURGERY  Bilateral   . KNEE SURGERY Right        Home Medications    Prior to Admission medications   Medication Sig Start Date End Date Taking? Authorizing Provider  fluticasone (FLONASE) 50 MCG/ACT nasal spray Place 1 spray into both nostrils daily. Patient not taking: Reported on 01/03/2017 12/15/16   Oneta Rack, NP  gabapentin (NEURONTIN) 100 MG capsule Take 2 capsules (200 mg total) by mouth 2 (two) times daily. Patient not taking: Reported on 01/03/2017 12/14/16   Oneta Rack, NP  gabapentin (NEURONTIN) 300 MG capsule Take 300 mg by mouth 3 (three) times daily. 03/28/17 04/27/17  [provider]  hydrOXYzine (ATARAX/VISTARIL) 50 MG tablet Take 1 tablet (50 mg total) by mouth 3 (three) times daily as needed for anxiety or itching. Patient not taking: Reported on 01/03/2017 12/14/16   Oneta Rack, NP  loratadine (CLARITIN) 10 MG tablet Take 1 tablet (10 mg total) by mouth daily. Patient not taking: Reported on 01/03/2017 12/15/16   Oneta Rack, NP  nicotine (NICODERM CQ - DOSED IN MG/24 HOURS) 21 mg/24hr patch Place 1 patch (21 mg total) onto the skin daily. Patient not taking: Reported on 01/03/2017 12/15/16   Oneta Rack, NP  QUEtiapine (SEROQUEL) 50 MG tablet Take 1 tablet (50 mg total) by mouth at bedtime. 12/14/16   Oneta Rack, NP  traZODone (DESYREL) 50 MG tablet Take 1 tablet (50  mg total) by mouth at bedtime as needed for sleep. Patient not taking: Reported on 01/03/2017 12/14/16   Oneta Rack, NP  venlafaxine XR (EFFEXOR-XR) 75 MG 24 hr capsule Take 3 capsules (225 mg total) by mouth daily with breakfast. Patient not taking: Reported on 01/03/2017 12/15/16   Oneta Rack, NP    Family History Family History  Problem Relation Age of Onset  . Cancer Mother   . Heart attack Father   . Mental illness Sister   . Alcoholism Brother     Social History Social History  Substance Use Topics  . Smoking status: Current Every Day Smoker    Packs/day: 1.00    Years:  38.00    Types: Cigarettes  . Smokeless tobacco: Never Used  . Alcohol use No     Comment: no etoh ffor 20     Allergies   Nuedexta [dextromethorphan-quinidine]   Review of Systems Review of Systems  Respiratory: Positive for cough, chest tightness and wheezing.   All other systems reviewed and are negative.    Physical Exam Updated Vital Signs BP 118/85 (BP Location: Right Arm)   Pulse 66   Temp 98.1 F (36.7 C) (Oral)   Resp 14   Ht  (1.753 m)   Wt 93.4 kg (206 lb)   SpO2 100%   BMI 30.42 kg/m   Physical Exam  Constitutional: He is oriented to person, place, and time. He appears well-developed and well-nourished.  HENT:  Head: Normocephalic and atraumatic.  Mouth/Throat: Oropharynx is clear and moist.  Eyes: Pupils are equal, round, and reactive to light. Conjunctivae and EOM are normal.  Neck: Normal range of motion.  Cardiovascular: Normal rate, regular rhythm and normal heart sounds.   Pulmonary/Chest: Effort normal. No respiratory distress. He has wheezes. He has rhonchi.  Wheezes throughout, rhonchi noted on right, no acute distress, able to speak in full sentences without difficulty  Abdominal: Soft. Bowel sounds are normal. There is no tenderness. There is no rebound.  Musculoskeletal: Normal range of motion.  Neurological: He is alert and oriented to person, place, and time.  Skin: Skin is warm and dry.  Psychiatric: He has a normal mood and affect.  Nursing note and vitals reviewed.    ED Treatments / Results  Labs (all labs ordered are listed, but only abnormal results are displayed) Labs Reviewed  CBC - Abnormal; Notable for the following:       Result Value   WBC 11.7 (*)    All other components within normal limits  BASIC METABOLIC PANEL  I-STAT TROPONIN, ED    EKG  EKG Interpretation  Date/Time:  Saturday September 10 2017 19:37:56 EDT Ventricular Rate:  73 PR Interval:  156 QRS Duration: 92 QT Interval:  370 QTC  Calculation: 407 R Axis:   25 Text Interpretation:  Normal sinus rhythm Anterior infarct , age undetermined Abnormal ECG No significant change since last tracing Confirmed by Rochele Raring [phone]) on 09/11/2017 2:21:52 AM       Radiology Dg Chest 2 View  Result Date: 09/10/2017 CLINICAL DATA:  Chest pain.  Shortness of breath.  Cough. EXAM: CHEST  2 VIEW COMPARISON:  06/05/2017 FINDINGS: Cardiomediastinal silhouette is normal. Mediastinal contours appear intact. There is no evidence of focal airspace consolidation, pleural effusion or pneumothorax. Osseous structures are without acute abnormality. Soft tissues are grossly normal. IMPRESSION: No active cardiopulmonary disease. Electronically Signed   By: Ted Mcalpine M.D.   On: 09/10/2017 20:28  Procedures Procedures (including critical care time)  Medications Ordered in ED Medications - No data to display   Initial Impression / Assessment and Plan / ED Course  I have reviewed the triage vital signs and the nursing notes.  Pertinent labs & imaging results that were available during my care of the patient were reviewed by me and considered in my medical decision making (see chart for details).  55 year old male here with chest pain. He describes it as tightness with worsening cough after walking around outside in the cold air today. Denies history of asthma or COPD but does smoke 1 pack per day. Used albuterol until about a month ago when he ran out. He is afebrile and nontoxic. Does have diffuse wheezes and rhonchi noted on right. No acute distress. Vitals are stable on room air. No known cardiac history.EKG and screening labs overall reassuring. Chest x-ray is clear. Will give nebs, prednisone, azithromycin here. Will reassess.  2:57 AM Patient has finished his nebs. States he is feeling better. Vital signs remained stable. Lung sounds are significantly improved.extremities appropriately for discharge. I suspect his symptoms are  due to URI/bronchitis. Low suspicion for ACS, PE, dissection, or acute cardiac event. Given he is a smoker and likely with component of COPD, will cover with antibiotics, prednisone taper. Given inhaler here to use at home. Follow closely with PCP if any ongoing issues.  Discussed plan with patient, he acknowledged understanding and agreed with plan of care.  Return precautions given for new or worsening symptoms.  Final Clinical Impressions(s) / ED Diagnoses   Final diagnoses:  Bronchitis  Chest pain, unspecified type    New Prescriptions New Prescriptions   AZITHROMYCIN (ZITHROMAX) 250 MG TABLET    Take 1 tablet (250 mg total) by mouth daily. Take first 2 tablets together, then 1 every day until finished.   PREDNISONE (DELTASONE) 20 MG TABLET    Take 40 mg by mouth daily for 3 days, then  by mouth daily for 3 days, then  daily for 3 days     Garlon Hatchet, PA-C 09/11/17 0353    Ward, Layla Maw, DO 09/11/17 0403

## 2017-09-11 NOTE — ED Notes (Signed)
Attempted blood draw, unsucessful

## 2017-09-12 ENCOUNTER — Inpatient Hospital Stay
Admission: AD | Admit: 2017-09-12 | Discharge: 2017-09-19 | DRG: 885 | Disposition: A | Discharge status: Home / Self Care | Payer: Medicare HMO | Source: Intra-hospital | Attending: Psychiatry | Admitting: Psychiatry

## 2017-09-12 ENCOUNTER — Encounter: Payer: Self-pay | Admitting: Psychiatry

## 2017-09-12 DIAGNOSIS — Y92239 Unspecified place in hospital as the place of occurrence of the external cause: Secondary | ICD-10-CM | POA: Diagnosis not present

## 2017-09-12 DIAGNOSIS — G8929 Other chronic pain: Secondary | ICD-10-CM | POA: Diagnosis present

## 2017-09-12 DIAGNOSIS — F1420 Cocaine dependence, uncomplicated: Secondary | ICD-10-CM | POA: Diagnosis present

## 2017-09-12 DIAGNOSIS — W1839XA Other fall on same level, initial encounter: Secondary | ICD-10-CM | POA: Diagnosis not present

## 2017-09-12 DIAGNOSIS — F411 Generalized anxiety disorder: Secondary | ICD-10-CM | POA: Diagnosis present

## 2017-09-12 DIAGNOSIS — F1220 Cannabis dependence, uncomplicated: Secondary | ICD-10-CM | POA: Diagnosis present

## 2017-09-12 DIAGNOSIS — K219 Gastro-esophageal reflux disease without esophagitis: Secondary | ICD-10-CM | POA: Diagnosis present

## 2017-09-12 DIAGNOSIS — Z888 Allergy status to other drugs, medicaments and biological substances status: Secondary | ICD-10-CM

## 2017-09-12 DIAGNOSIS — M25512 Pain in left shoulder: Secondary | ICD-10-CM | POA: Diagnosis present

## 2017-09-12 DIAGNOSIS — Z811 Family history of alcohol abuse and dependence: Secondary | ICD-10-CM

## 2017-09-12 DIAGNOSIS — G4700 Insomnia, unspecified: Secondary | ICD-10-CM | POA: Diagnosis present

## 2017-09-12 DIAGNOSIS — R45851 Suicidal ideations: Secondary | ICD-10-CM

## 2017-09-12 DIAGNOSIS — J441 Chronic obstructive pulmonary disease with (acute) exacerbation: Secondary | ICD-10-CM | POA: Diagnosis present

## 2017-09-12 DIAGNOSIS — F410 Panic disorder [episodic paroxysmal anxiety] without agoraphobia: Secondary | ICD-10-CM | POA: Diagnosis present

## 2017-09-12 DIAGNOSIS — Z818 Family history of other mental and behavioral disorders: Secondary | ICD-10-CM | POA: Diagnosis not present

## 2017-09-12 DIAGNOSIS — F17210 Nicotine dependence, cigarettes, uncomplicated: Secondary | ICD-10-CM | POA: Diagnosis present

## 2017-09-12 DIAGNOSIS — Z79899 Other long term (current) drug therapy: Secondary | ICD-10-CM | POA: Diagnosis not present

## 2017-09-12 DIAGNOSIS — F314 Bipolar disorder, current episode depressed, severe, without psychotic features: Principal | ICD-10-CM | POA: Diagnosis present

## 2017-09-12 DIAGNOSIS — Z8661 Personal history of infections of the central nervous system: Secondary | ICD-10-CM

## 2017-09-12 DIAGNOSIS — F4310 Post-traumatic stress disorder, unspecified: Secondary | ICD-10-CM | POA: Diagnosis present

## 2017-09-12 DIAGNOSIS — J449 Chronic obstructive pulmonary disease, unspecified: Secondary | ICD-10-CM | POA: Diagnosis present

## 2017-09-12 DIAGNOSIS — F17200 Nicotine dependence, unspecified, uncomplicated: Secondary | ICD-10-CM | POA: Diagnosis present

## 2017-09-12 MED ORDER — ACETAMINOPHEN 325 MG PO TABS: 650.0000 mg | ORAL_TABLET | Freq: Four times a day (QID) | ORAL | Status: DC | PRN

## 2017-09-12 MED ORDER — ALUM & MAG HYDROXIDE-SIMETH 200-200-20 MG/5ML PO SUSP: 30.0000 mL | ORAL | Status: DC | PRN

## 2017-09-12 MED ORDER — TRAZODONE HCL 100 MG PO TABS: 100.0000 mg | ORAL_TABLET | Freq: Every evening | ORAL | Status: DC | PRN

## 2017-09-12 MED ORDER — MAGNESIUM HYDROXIDE 400 MG/5ML PO SUSP: 30.0000 mL | Freq: Every day | ORAL | Status: DC | PRN

## 2017-09-12 MED ORDER — NICOTINE 21 MG/24HR TD PT24
21.0000 mg | MEDICATED_PATCH | Freq: Every day | TRANSDERMAL | Status: DC
  Administered 2017-09-13 – 2017-09-19 (×7): 21 mg via TRANSDERMAL
  Filled 2017-09-12 (×6): qty 1

## 2017-09-12 MED ORDER — GABAPENTIN 600 MG PO TABS
300.0000 mg | ORAL_TABLET | Freq: Three times a day (TID) | ORAL | Status: DC
  Administered 2017-09-13 – 2017-09-19 (×19): 300 mg via ORAL
  Filled 2017-09-12 (×18): qty 1

## 2017-09-12 MED ORDER — ALBUTEROL SULFATE HFA 108 (90 BASE) MCG/ACT IN AERS
2.0000 | INHALATION_SPRAY | Freq: Four times a day (QID) | RESPIRATORY_TRACT | Status: DC | PRN
  Administered 2017-09-16 – 2017-09-18 (×2): 2 via RESPIRATORY_TRACT
  Filled 2017-09-12: qty 6.7

## 2017-09-12 MED ORDER — HYDROXYZINE HCL 50 MG PO TABS
50.0000 mg | ORAL_TABLET | Freq: Three times a day (TID) | ORAL | Status: DC | PRN
Start: 2017-09-12 — End: 2017-09-15
  Administered 2017-09-13: 50 mg via ORAL
  Filled 2017-09-12: qty 1

## 2017-09-12 MED ORDER — QUETIAPINE FUMARATE 100 MG PO TABS
100.0000 mg | ORAL_TABLET | Freq: Every day | ORAL | Status: DC
  Administered 2017-09-12: 100 mg via ORAL
  Filled 2017-09-12: qty 1

## 2017-09-12 MED ORDER — VENLAFAXINE HCL ER 75 MG PO CP24
150.0000 mg | ORAL_CAPSULE | Freq: Every day | ORAL | Status: DC
  Administered 2017-09-13 – 2017-09-15 (×3): 150 mg via ORAL
  Filled 2017-09-12 (×3): qty 2

## 2017-09-12 NOTE — ED Triage Notes (Signed)
Vol. Consent form faxed to [facility] [phone]

## 2017-09-12 NOTE — Progress Notes (Signed)
Patient meets criteria for inpatient treatment. CSW faxed referrals to the following inpatient facilities for review:  [facility], [facility], [facility], [facility], [facility], [facility], [facility], [facility], [facility]   TTS will continue to seek bed placement.   Ishani Goldwasser MSW, LCSWA CSW Disposition [phone]    

## 2017-09-12 NOTE — Tx Team (Signed)
Initial Treatment Plan 09/12/2017 11:04 PM [name] ZOX:[id number]    PATIENT STRESSORS: Financial difficulties Substance abuse   PATIENT STRENGTHS: Motivation for treatment/growth Supportive family/friends   PATIENT IDENTIFIED PROBLEMS:   Dependent drug use    Major depressive disorder    Suicide ideation           DISCHARGE CRITERIA:  Improved stabilization in mood, thinking, and/or behavior Motivation to continue treatment in a less acute level of care  PRELIMINARY DISCHARGE PLAN: Attend aftercare/continuing care group Placement in alternative living arrangements  PATIENT/FAMILY INVOLVEMENT: This treatment plan has been presented to and reviewed with the patient, Anthony [name],.  The patient have been given the opportunity to ask questions and make suggestions.  Lelan Pons, RN 09/12/2017, 11:04 PM

## 2017-09-12 NOTE — BH Assessment (Signed)
Patient has been accepted to [facility].  Accepting physician is Dr. Jennet Maduro.  Attending Physician will be Dr. Gaye Pollack.  Patient has been assigned to room 322, by [facility] Charge Nurse Gigi.  Call report to [phone].  Representative/Transfer Coordinator is Warden/ranger Patient pre-admitted by [facility] Patient Access Mertie Clause)  [facility] Staff [facility] & Carney Bern, TTS) made aware of acceptance.  Patient can arrive after 7:30pm

## 2017-09-12 NOTE — ED Triage Notes (Signed)
PT sitting up on side of bed  Awake and talking to sitter. Pt voided on bed while  Talking to sitter. Pt  Said " I just peed on the bed. Pt is A/O . Pt provided with clean scrub pants ,wash cloth and towel. Pt instructed to clean up in the bathroom.

## 2017-09-12 NOTE — ED Triage Notes (Signed)
Voluntary admission and consent for treatment  Faxed to [facility].

## 2017-09-12 NOTE — ED Triage Notes (Signed)
Attempted to call report to [facility]  but saff requested  Report to be called for nights.
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
## 2017-09-12 NOTE — ED Triage Notes (Signed)
Pt 's bed and floor cleaned by staff while Pt was in bathroom changing his cloths.

## 2017-09-12 NOTE — Progress Notes (Signed)
Per Jerilynn Som with the patient has been accepted to [facility] for inpatient treatment.   [facility] requesting that the patient sign a Voluntary Consent form and that it be faxed to [facility].   Pending a bed assignment.   Celine Ahr, RN notified.   Baldo Daub MSW, LCSWA CSW Disposition [phone]

## 2017-09-12 NOTE — [facility] Group Notes (Signed)
[facility] Group Notes:  (Nursing/MHT/Case Management/Adjunct)  Date:  09/12/2017  Time:  11:22 PM  Type of Therapy:  Group Therapy  Participation Level:  Did Not Attend  Participation Quality:  Came on unit after group  Affect:Summary of Progress/Problems:  Anthony [name] 09/12/2017, 11:22 PM

## 2017-09-12 NOTE — ED Notes (Signed)
Regular Diet was ordered for Lunch, and The Patient was given a Coke and Cookies for snack.

## 2017-09-12 NOTE — ED Notes (Signed)
Patient was given a [place] Medical illustrator and Drink for [employer], and A Regular Diet was ordered for [employer].

## 2017-09-12 NOTE — ED Triage Notes (Signed)
Pt reported to this writer if he was sent home to day he would be dead by to night. Pt reported he would hang  Himself with a belt.

## 2017-09-12 NOTE — ED Notes (Signed)
Patient just finish talking with tts.

## 2017-09-13 DIAGNOSIS — K219 Gastro-esophageal reflux disease without esophagitis: Secondary | ICD-10-CM | POA: Diagnosis present

## 2017-09-13 DIAGNOSIS — F314 Bipolar disorder, current episode depressed, severe, without psychotic features: Principal | ICD-10-CM

## 2017-09-13 DIAGNOSIS — J449 Chronic obstructive pulmonary disease, unspecified: Secondary | ICD-10-CM | POA: Diagnosis present

## 2017-09-13 LAB — HEMOGLOBIN A1C
HEMOGLOBIN A1C: 5.4 % (ref 4.8–5.6)
MEAN PLASMA GLUCOSE: 108.28 mg/dL

## 2017-09-13 LAB — LIPID PANEL
CHOL/HDL RATIO: 2.4 ratio
CHOLESTEROL: 166 mg/dL (ref 0–200)
HDL: 70 mg/dL (ref >40)
LDL Cholesterol: 87 mg/dL (ref 0–99)
Triglycerides: 46 mg/dL (ref <150)
VLDL: 9 mg/dL (ref 0–40)

## 2017-09-13 LAB — TSH: TSH: 0.871 u[IU]/mL (ref 0.350–4.500)

## 2017-09-13 MED ORDER — LITHIUM CARBONATE 300 MG PO CAPS
300.0000 mg | ORAL_CAPSULE | Freq: Three times a day (TID) | ORAL | Status: DC
  Administered 2017-09-13 – 2017-09-19 (×18): 300 mg via ORAL
  Filled 2017-09-13 (×18): qty 1

## 2017-09-13 MED ORDER — PANTOPRAZOLE SODIUM 40 MG PO TBEC
40.0000 mg | DELAYED_RELEASE_TABLET | Freq: Every day | ORAL | Status: DC
  Administered 2017-09-13 – 2017-09-19 (×7): 40 mg via ORAL
  Filled 2017-09-13 (×7): qty 1

## 2017-09-13 MED ORDER — QUETIAPINE FUMARATE 25 MG PO TABS
50.0000 mg | ORAL_TABLET | Freq: Every day | ORAL | Status: DC
  Administered 2017-09-13 – 2017-09-18 (×6): 50 mg via ORAL
  Filled 2017-09-13 (×6): qty 2

## 2017-09-13 MED ORDER — DICLOFENAC SODIUM 1 % TD GEL
4.0000 g | Freq: Four times a day (QID) | TRANSDERMAL | Status: DC
  Administered 2017-09-13 – 2017-09-19 (×20): 4 g via TOPICAL
  Filled 2017-09-13 (×2): qty 100

## 2017-09-13 NOTE — [facility] Group Notes (Signed)
LCSW Group Therapy Note  09/13/2017 9:30am  Type of Therapy/Topic:  Group Therapy:  Feelings about Diagnosis  Participation Level:  Active   Description of Group:   This group will allow patients to explore their thoughts and feelings about diagnoses they have received. Patients will be guided to explore their level of understanding and acceptance of these diagnoses. Facilitator will encourage patients to process their thoughts and feelings about the reactions of others to their diagnosis and will guide patients in identifying ways to discuss their diagnosis with significant others in their lives. This group will be process-oriented, with patients participating in exploration of their own experiences, giving and receiving support, and processing challenge from other group members.   Therapeutic Goals: 1. Patient will demonstrate understanding of diagnosis as evidenced by identifying two or more symptoms of the disorder 2. Patient will be able to express two feelings regarding the diagnosis 3. Patient will demonstrate their ability to communicate their needs through discussion and/or role play  Summary of Patient Progress:  Pt able to meet therapeutic goals.  Shares questions about feeling more depressed now vs. A history of being diagnosed with Bipolar disorder.     Therapeutic Modalities:   Cognitive Behavioral Therapy Brief Therapy Feelings Identification    Glennon Mac, LCSW 09/13/2017 10:44 AM

## 2017-09-13 NOTE — [facility] Suicide Risk Assessment (Signed)
[facility] Admission Suicide Risk Assessment   Nursing information obtained from:    Demographic factors:    Current Mental Status:    Loss Factors:    Historical Factors:    Risk Reduction Factors:     Total Time spent with patient: 1 hour Principal Problem: Bipolar I disorder, most recent episode depressed, severe without psychotic features (HCC) Diagnosis:   Patient Active Problem List   Diagnosis Date Noted  . COPD (chronic obstructive pulmonary disease) (HCC) [J44.9] 09/13/2017  . Cannabis use disorder, moderate, dependence (HCC) [F12.20] 09/12/2017  . Tobacco use disorder [F17.200] 09/12/2017  . Cocaine use disorder, severe, dependence (HCC) [F14.20] 01/03/2017  . History of encephalitis [Z86.61] 12/10/2016  . Bipolar I disorder, most recent episode depressed, severe without psychotic features (HCC) [F31.4] 12/09/2016  . Allergic reaction [T78.40XA]   . Rash [R21]   . Suicidal ideation [R45.851]   . PTSD (post-traumatic stress disorder) [F43.10] 09/12/2014  . Episodic mood disorder (HCC) [F39] 09/11/2014   Subjective Data: suicidal ideation  Continued Clinical Symptoms:  Alcohol Use Disorder Identification Test Final Score (AUDIT): 12 The "Alcohol Use Disorders Identification Test", Guidelines for Use in Primary Care, Second Edition.  World Science writer [facility]). Score between 0-7:  no or low risk or alcohol related problems. Score between 8-15:  moderate risk of alcohol related problems. Score between 16-19:  high risk of alcohol related problems. Score 20 or above:  warrants further diagnostic evaluation for alcohol dependence and treatment.   CLINICAL FACTORS:   Bipolar Disorder:   Depressive phase Depression:   Comorbid alcohol abuse/dependence Insomnia Alcohol/Substance Abuse/Dependencies Medical Diagnoses and Treatments/Surgeries   Musculoskeletal: Strength & Muscle Tone: within normal limits Gait & Station: broad based Patient leans: N/A  Psychiatric  Specialty Exam: Physical Exam  Nursing note and vitals reviewed. Psychiatric: His speech is normal. Judgment normal. His affect is blunt. He is slowed and withdrawn. Cognition and memory are normal. He exhibits a depressed mood. He expresses suicidal ideation. He expresses suicidal plans.    Review of Systems  Musculoskeletal: Positive for joint pain.  Neurological: Positive for seizures.  Psychiatric/Behavioral: Positive for depression, substance abuse and suicidal ideas. The patient is nervous/anxious.   All other systems reviewed and are negative.   Blood pressure 121/80, pulse (!) 58, temperature 97.7 F (36.5 C), temperature source Oral, resp. rate 20, height  (1.753 m), weight 93 kg (205 lb), SpO2 100 %.Body mass index is 30.27 kg/m.  General Appearance: Casual  Eye Contact:  Good  Speech:  Garbled  Volume:  Normal  Mood:  Depressed and Hopeless  Affect:  Blunt  Thought Process:  Goal Directed and Descriptions of Associations: Intact  Orientation:  Full (Time, Place, and Person)  Thought Content:  WDL  Suicidal Thoughts:  Yes.  with intent/plan  Homicidal Thoughts:  No  Memory:  Immediate;   Fair Recent;   Fair Remote;   Fair  Judgement:  Fair  Insight:  Shallow  Psychomotor Activity:  Psychomotor Retardation  Concentration:  Concentration: Fair and Attention Span: Fair  Recall:  [employer] of Knowledge:  Fair  Language:  Fair  Akathisia:  No  Handed:  Right  AIMS (if indicated):     Assets:  Communication Skills Desire for Improvement Financial Resources/Insurance Housing Resilience  ADL's:  Intact  Cognition:  WNL  Sleep:         COGNITIVE FEATURES THAT CONTRIBUTE TO RISK:  None    SUICIDE RISK:   Svere.  Patients presenting  with multiple risk factors.   PLAN OF CARE: Hospital admission, medication management, substance abuse counseling, discharge planning.  Anthony [name] is a 56 year old male with a history of bipolar disorder admitted for suicidal  ideation with a plan to overdose in the context of severe social stressors.  # Suicidal ideation - The patient is able to contract for safety in the hospital  # Mood - Continue Effexor 150 mg daily - Continue Seroquel 50 mg nightly - Started lithium 300 mg 3 times a day  # Anxiety - Continue hydroxyzine 50 mg 3 times a day  # Chronic pain - continue Neurontin 300 mg 3 times daily  # COPD - Continue albuterol  # smoking - Nicotine patch is available  # metabolic syndrome monitoring - Lipid panel, TSH, hemoglobin A1c are pending  # EKG  # disposition - Discharged back to his boarding house - Follow-up with his primary psychiatrist   I certify that inpatient services furnished can reasonably be expected to improve the patient's condition.   Kristine Linea, MD 09/13/2017, 10:33 AM

## 2017-09-13 NOTE — Progress Notes (Signed)
patient was admitted to the unit  with escort,  Appeared to have unsteady gait shuffling his feet but alert and oriented x 4 responding well and appropriately vital is stable denies any pain. Patient have a contracted left hand and walks with limp, belonging are checked and scanned with metal detector no weapon was found, skin check is done with a second RN Abiodun, skin is intack no broken skin noted. Patient was feed food and hydrated with fluids and juices  And introduced to his room, took medications and contracted safety of himself and others assessment is complete no distress noted.

## 2017-09-13 NOTE — Progress Notes (Signed)
D: Pt A & O X4. Denies SI, HI, AVh and pain "not right now but a week ago I was thinking about killing myself". Presents with congruent affect and mood on interactions. Rates his depression 8/10, hopelessness 7/10 and 1/10. Per pt "best sleep I've got in weeks" when assessed. Reports good appetite with low energy and poor concentration level. Attended scheduled groups and unit activities, actively engaged with peers and staff.  A: Emotional support and availability provided to pt. All medications administered as prescribed with verbal education and effects monitored. Encouraged pt to voice concerns, comply with treatment regimen and attend to his ADLs. Routine safety checks remains effective without self harm gestures to note at this time.  R: Pt receptive to care. Showered and changed scrubs when prompted. Tolerates all PO intake well. Compliant with medications, denies adverse drug reactions or concerns this shift. POC remains effective for safety and mood stability.

## 2017-09-13 NOTE — H&P (Addendum)
Psychiatric Admission Assessment Adult  Patient Identification: Anthony [name] MRN:  [id number] Date of Evaluation:  09/13/2017 Chief Complaint:  Depression Principal Diagnosis: Bipolar I disorder, most recent episode depressed, severe without psychotic features (HCC) Diagnosis:   Patient Active Problem List   Diagnosis Date Noted  . COPD (chronic obstructive pulmonary disease) (HCC) [J44.9] 09/13/2017  . Cannabis use disorder, moderate, dependence (HCC) [F12.20] 09/12/2017  . Tobacco use disorder [F17.200] 09/12/2017  . Cocaine use disorder, severe, dependence (HCC) [F14.20] 01/03/2017  . History of encephalitis [Z86.61] 12/10/2016  . Bipolar I disorder, most recent episode depressed, severe without psychotic features (HCC) [F31.4] 12/09/2016  . Allergic reaction [T78.40XA]   . Rash [R21]   . Suicidal ideation [R45.851]   . PTSD (post-traumatic stress disorder) [F43.10] 09/12/2014  . Episodic mood disorder (HCC) [F39] 09/11/2014   History of Present Illness:   Identifying data. Anthony [name] is a 56 year old male with history of bipolar illness.  Chief complaint. "I've been thinking about killing myself for a week."  History of present illness. Information was obtained from the chart. The patient was brought to the emergency room initially for chest pain. He was walking around furniture in [facility] and started experiencing acute pain.  Now believes that this was panic attack. In the emergency room he disclosed that he has been feeling suicidal for the past week or so with a plan to overdose on medications. The patient reports that since the beginning of the month he has been increasingly depressed with extremely poor sleep, decreased appetite, anhedonia, feeling of guilt and hopelessness poor energy and concentration, social isolation, crying spells, high anxiety and suicidal ideation. The patient did not receive he Social Security disability check this month and has been worried that he  is about to lose his housing. He has been able to survive with help from his church. He denies psychotic symptoms or symptoms suggestive of bipolar mania. She reports heightened anxiety witH panic attacks. He also has a history of PTSD. He denies alcohol use. He has been smoking cannabis regularly to address chronic pain. There is no other drug abuse.  Past psychiatric history. He was diagnosed with bipolar disorder at the age of 56. He has had several psychiatry hospitalizations. He attempted suicide by overdose twice. He has been tried on numerous medications including lithium, Depakote and Tegretol. He remembers that lithium was helpful. He was also on Geodon that did not help. He sees a psychiatrist every 3 months and has been prescribed a combination Effexor, Seroquel, hydroxyzine, and Neurontin. The patient reports that he suffered encephalitis from a mosquito bite from a mosquito bite that resulted in difficulties walking. The patient has broad-base but otherwise stable gait.   Family psychiatric history. None   Social history. He is an Investment banker, operational. He was in the [employer] for 3 years and 8 months serving in the first [place]. He was discharged honorably but is not VA connected. He has been married twice and is divorced. He has no children or family. He lives in a boarding house. He stopped receiving his social security disability check this month for reasons he does not understand.  Total Time spent with patient: 1 hour  Is the patient at risk to self? Yes.    Has the patient been a risk to self in the past 6 months? No.  Has the patient been a risk to self within the distant past? Yes.    Is the patient a risk to others? No.  Has  the patient been a risk to others in the past 6 months? No.  Has the patient been a risk to others within the distant past? No.   Prior Inpatient Therapy:   Prior Outpatient Therapy:    Alcohol Screening: 1. How often do you have a drink containing alcohol?: 2  to 3 times a week 2. How many drinks containing alcohol do you have on a typical day when you are drinking?: 3 or 4 3. How often do you have six or more drinks on one occasion?: Less than monthly Preliminary Score: 2 4. How often during the last year have you found that you were not able to stop drinking once you had started?: Less than monthly 5. How often during the last year have you failed to do what was normally expected from you becasue of drinking?: Less than monthly 6. How often during the last year have you needed a first drink in the morning to get yourself going after a heavy drinking session?: Less than monthly 7. How often during the last year have you had a feeling of guilt of remorse after drinking?: Less than monthly 8. How often during the last year have you been unable to remember what happened the night before because you had been drinking?: Weekly 9. Have you or someone else been injured as a result of your drinking?: No 10. Has a relative or friend or a doctor or another health worker been concerned about your drinking or suggested you cut down?: No Alcohol Use Disorder Identification Test Final Score (AUDIT): 12 Brief Intervention: Yes Substance Abuse History in the last 12 months:  Yes.   Consequences of Substance Abuse: Negative Previous Psychotropic Medications: Yes  Psychological Evaluations: No  Past Medical History:  Past Medical History:  Diagnosis Date  . Bell's palsy   . Cannabis abuse   . Cellulitis of right leg 04/2017  . Chronic left shoulder pain   . Depression   . Encephalitis   . Episodic mood disorder (HCC)   . Homelessness   . Post traumatic stress disorder   . Seizures (HCC)    last seizure 12 yrs ago  . Sepsis (HCC) 04/2017    Past Surgical History:  Procedure Laterality Date  . APPENDECTOMY    . brain damage    . CHOLECYSTECTOMY    . FOOT SURGERY Bilateral   . KNEE SURGERY Right    Family History:  Family History  Problem Relation  Age of Onset  . Cancer Mother   . Heart attack Father   . Mental illness Sister   . Alcoholism Brother    Tobacco Screening: Have you used any form of tobacco in the last 30 days? (Cigarettes, Smokeless Tobacco, Cigars, and/or Pipes): Yes Tobacco use, Select all that apply: 5 or more cigarettes per day Are you interested in Tobacco Cessation Medications?: Yes, will notify MD for an order Counseled patient on smoking cessation including recognizing danger situations, developing coping skills and basic information about quitting provided: Yes Social History:  History  Alcohol Use No     History  Drug Use  . Frequency: 2.0 times per week  . Types: Marijuana    Comment: Weekly use + THC    Additional Social History:                           Allergies:   Allergies  Allergen Reactions  . Nuedexta [Dextromethorphan-Quinidine] Other (See Comments)  hallucinations   Lab Results:  Results for orders placed or performed during the hospital encounter of 09/12/17 (from the past 48 hour(s))  Hemoglobin A1c     Status: None   Collection Time: 09/13/17  7:00 AM  Result Value Ref Range   Hgb A1c MFr Bld 5.4 4.8 - 5.6 %    Comment: (NOTE) Pre diabetes:          5.7%-6.4% Diabetes:              >6.4% Glycemic control for   <7.0% adults with diabetes    Mean Plasma Glucose 108.28 mg/dL    Comment: Performed at [facility], [address] [place], [place] [address]  Lipid panel     Status: None   Collection Time: 09/13/17  7:00 AM  Result Value Ref Range   Cholesterol 166 0 - 200 mg/dL   Triglycerides 46 <604 mg/dL   HDL 70 >54 mg/dL   Total CHOL/HDL Ratio 2.4 RATIO   VLDL 9 0 - 40 mg/dL   LDL Cholesterol 87 0 - 99 mg/dL    Comment:        Total Cholesterol/HDL:CHD Risk Coronary Heart Disease Risk Table                     Men   Women  1/2 Average Risk   3.4   3.3  Average Risk       5.0   4.4  2 X Average Risk   9.6   7.1  3 X Average Risk  23.4   11.0         Use the calculated Patient Ratio above and the CHD Risk Table to determine the patient's CHD Risk.        ATP III CLASSIFICATION (LDL):  <100     mg/dL   Optimal  098-119  mg/dL   Near or Above                    Optimal  130-159  mg/dL   Borderline  147-829  mg/dL   High  >562     mg/dL   Very High   TSH     Status: None   Collection Time: 09/13/17  7:00 AM  Result Value Ref Range   TSH 0.871 0.350 - 4.500 uIU/mL    Comment: Performed by a 3rd Generation assay with a functional sensitivity of <=0.01 uIU/mL.    Blood Alcohol level:  Lab Results  Component Value Date   ETH <10 09/11/2017   ETH <5 04/10/2017    Metabolic Disorder Labs:  Lab Results  Component Value Date   HGBA1C 5.4 09/13/2017   MPG 108.28 09/13/2017   MPG 117 12/11/2016   Lab Results  Component Value Date   PROLACTIN 15.5 (H) 12/11/2016   Lab Results  Component Value Date   CHOL 166 09/13/2017   TRIG 46 09/13/2017   HDL 70 09/13/2017   CHOLHDL 2.4 09/13/2017   VLDL 9 09/13/2017   LDLCALC 87 09/13/2017   LDLCALC 69 12/11/2016    Current Medications: Current Facility-Administered Medications  Medication Dose Route Frequency Provider Last Rate Last Dose  . acetaminophen (TYLENOL) tablet 650 mg  650 mg Oral Q6H PRN Pucilowska, Jolanta B, MD      . albuterol (PROVENTIL HFA;VENTOLIN HFA) 108 (90 Base) MCG/ACT inhaler 2 puff  2 puff Inhalation Q6H PRN Pucilowska, Jolanta B, MD      . alum &  mag hydroxide-simeth (MAALOX/MYLANTA) 200-200-20 MG/5ML suspension 30 mL  30 mL Oral Q4H PRN Pucilowska, Jolanta B, MD      . diclofenac sodium (VOLTAREN) 1 % transdermal gel 4 g  4 g Topical QID Pucilowska, Jolanta B, MD      . gabapentin (NEURONTIN) tablet 300 mg  300 mg Oral TID Pucilowska, Jolanta B, MD   300 mg at 09/13/17 0900  . hydrOXYzine (ATARAX/VISTARIL) tablet 50 mg  50 mg Oral TID PRN Pucilowska, Jolanta B, MD      . lithium carbonate capsule 300 mg  300 mg Oral TID WC Pucilowska, Jolanta B, MD       . magnesium hydroxide (MILK OF MAGNESIA) suspension 30 mL  30 mL Oral Daily PRN Pucilowska, Jolanta B, MD      . nicotine (NICODERM CQ - dosed in mg/24 hours) patch 21 mg  21 mg Transdermal Q0600 Pucilowska, Jolanta B, MD   21 mg at 09/13/17 0637  . QUEtiapine (SEROQUEL) tablet 50 mg  50 mg Oral QHS Pucilowska, Jolanta B, MD      . venlafaxine XR (EFFEXOR-XR) 24 hr capsule 150 mg  150 mg Oral Q breakfast Pucilowska, Jolanta B, MD   150 mg at 09/13/17 0900   PTA Medications: Prescriptions Prior to Admission  Medication Sig Dispense Refill Last Dose  . albuterol (PROVENTIL HFA;VENTOLIN HFA) 108 (90 Base) MCG/ACT inhaler Inhale 1 puff into the lungs every 6 (six) hours as needed for wheezing or shortness of breath.   rescue at rescue  . azithromycin (ZITHROMAX) 250 MG tablet Take 1 tablet (250 mg total) by mouth daily. Take first 2 tablets together, then 1 every day until finished. 6 tablet 0   . fluticasone (FLONASE) 50 MCG/ACT nasal spray Place 1 spray into both nostrils daily. 1 g 1 Past Week at Unknown time  . fluticasone furoate-vilanterol (BREO ELLIPTA) 200-25 MCG/INH AEPB Inhale 1 puff into the lungs daily.   Past Week at Unknown time  . gabapentin (NEURONTIN) 100 MG capsule Take 2 capsules (200 mg total) by mouth 2 (two) times daily. (Patient taking differently: Take 300 mg by mouth 2 (two) times daily. ) 60 capsule 0 Past Month at Unknown time  . gabapentin (NEURONTIN) 300 MG capsule Take 300 mg by mouth 3 (three) times daily.   04/09/2017 at Unknown time  . hydrOXYzine (ATARAX/VISTARIL) 50 MG tablet Take 1 tablet (50 mg total) by mouth 3 (three) times daily as needed for anxiety or itching. 30 tablet 0 Past Month at Unknown time  . loratadine (CLARITIN) 10 MG tablet Take 1 tablet (10 mg total) by mouth daily. 30 tablet 0 Past Week at Unknown time  . predniSONE (DELTASONE) 20 MG tablet Take 40 mg by mouth daily for 3 days, then  by mouth daily for 3 days, then  daily for 3 days 12  tablet 0   . QUEtiapine (SEROQUEL) 50 MG tablet Take 1 tablet (50 mg total) by mouth at bedtime. 30 tablet 0 Past Month at Unknown time  . venlafaxine XR (EFFEXOR-XR) 75 MG 24 hr capsule Take 3 capsules (225 mg total) by mouth daily with breakfast. 30 capsule 0 Past Month at Unknown time    Musculoskeletal: Strength & Muscle Tone: within normal limits Gait & Station: normal Patient leans: N/A  Psychiatric Specialty Exam: Physical Exam  Nursing note and vitals reviewed. Constitutional: He is oriented to person, place, and time. He appears well-developed and well-nourished.  HENT:  Head: Normocephalic and atraumatic.  Eyes: Pupils  are equal, round, and reactive to light. Conjunctivae and EOM are normal.  Neck: Normal range of motion. Neck supple.  Cardiovascular: Normal rate, regular rhythm and normal heart sounds.   Respiratory: Effort normal and breath sounds normal.  GI: Soft. Bowel sounds are normal.  Musculoskeletal: Normal range of motion.  Neurological: He is alert and oriented to person, place, and time.  Skin: Skin is warm and dry.  Psychiatric: Judgment normal. His affect is blunt. His speech is slurred. He is slowed and withdrawn. Cognition and memory are normal. He exhibits a depressed mood. He expresses suicidal ideation. He expresses suicidal plans.    Review of Systems  Musculoskeletal: Positive for joint pain.  Neurological: Negative.   Psychiatric/Behavioral: Positive for depression, substance abuse and suicidal ideas. The patient is nervous/anxious.   All other systems reviewed and are negative.   Blood pressure 121/80, pulse (!) 58, temperature 97.7 F (36.5 C), temperature source Oral, resp. rate 20, height  (1.753 m), weight 93 kg (205 lb), SpO2 100 %.Body mass index is 30.27 kg/m.  See SRA.                                                  Sleep:       Treatment Plan Summary: Daily contact with patient to assess and evaluate  symptoms and progress in treatment and Medication management   Mr. Tutterow is a 56 year old male with a history of bipolar disorder admitted for suicidal ideation with a plan to overdose in the context of severe social stressors.  # Suicidal ideation - The patient is able to contract for safety in the hospital  # Mood - Continue Effexor 150 mg daily - Continue Seroquel 50 mg nightly - Started lithium 300 mg 3 times a day  # Anxiety - Continue hydroxyzine 50 mg 3 times a day  # Chronic pain - continue Neurontin 300 mg 3 times daily  # COPD - Continue albuterol  # smoking - Nicotine patch is available  # metabolic syndrome monitoring - Lipid panel, TSH, hemoglobin A1c are pending  # EKG  # GERD - Start Protonix  # Disposition - Discharged back to his boarding house - Follow-up with his primary psychiatrist   Observation Level/Precautions:  15 minute checks  Laboratory:  CBC Chemistry Profile UDS UA  Psychotherapy:    Medications:    Consultations:    Discharge Concerns:    Estimated LOS:  Other:     Physician Treatment Plan for Primary Diagnosis: Bipolar I disorder, most recent episode depressed, severe without psychotic features (HCC) Long Term Goal(s): Improvement in symptoms so as ready for discharge  Short Term Goals: Ability to identify changes in lifestyle to reduce recurrence of condition will improve, Ability to verbalize feelings will improve, Ability to disclose and discuss suicidal ideas, Ability to demonstrate self-control will improve, Ability to identify and develop effective coping behaviors will improve and Ability to identify triggers associated with substance abuse/mental health issues will improve  Physician Treatment Plan for Secondary Diagnosis: Principal Problem:   Bipolar I disorder, most recent episode depressed, severe without psychotic features (HCC) Active Problems:   Suicidal ideation   History of encephalitis   Cannabis use  disorder, moderate, dependence (HCC)   Tobacco use disorder   COPD (chronic obstructive pulmonary disease) (HCC)  Long Term Goal(s): Improvement in symptoms  so as ready for discharge  Short Term Goals: Ability to identify changes in lifestyle to reduce recurrence of condition will improve, Ability to demonstrate self-control will improve and Ability to identify triggers associated with substance abuse/mental health issues will improve  I certify that inpatient services furnished can reasonably be expected to improve the patient's condition.    Kristine Linea, MD 10/16/201811:18 AM

## 2017-09-13 NOTE — [facility] Group Notes (Signed)
[facility] Group Notes:  (Nursing/MHT/Case Management/Adjunct)  Date:  09/13/2017  Time:  6:29 PM  Type of Therapy:  Psychoeducational Skills  Participation Level:  Active  Participation Quality:  Appropriate  Affect:  Appropriate  Cognitive:  Alert and Appropriate  Insight:  Good  Engagement in Group:  Engaged  Modes of Intervention:  Education and Problem-solving  Summary of Progress/Problems:  Yovana Scogin A Concha Sudol 09/13/2017, 6:29 PM

## 2017-09-14 NOTE — [facility] Group Notes (Signed)
[facility] Group Notes:  (Nursing/MHT/Case Management/Adjunct)  Date:  09/14/2017  Time:  3:31 PM  Type of Therapy:  Psychoeducational Skills  Participation Level:  Did Not Attend   Judene CompanionMary  Massa Pe 09/14/2017, 3:31 PM

## 2017-09-14 NOTE — [facility] Group Notes (Signed)
  [facility] LCSW Group Therapy Note  Date/Time: 09/14/17, 0930  Type of Therapy/Topic:  Group Therapy:  Emotion Regulation  Participation Level:  Active   Mood:pleasant  Description of Group:    The purpose of this group is to assist patients in learning to regulate negative emotions and experience positive emotions. Patients will be guided to discuss ways in which they have been vulnerable to their negative emotions. These vulnerabilities will be juxtaposed with experiences of positive emotions or situations, and patients challenged to use positive emotions to combat negative ones. Special emphasis will be placed on coping with negative emotions in conflict situations, and patients will process healthy conflict resolution skills.  Therapeutic Goals: 1. Patient will identify two positive emotions or experiences to reflect on in order to balance out negative emotions:  2. Patient will label two or more emotions that they find the most difficult to experience:  3. Patient will be able to demonstrate positive conflict resolution skills through discussion or role plays:   Summary of Patient Progress: Pt identified anger and impulsiveness as emotions that are difficult to manage.  Pt was active in discussion of consequences of losing control of emotions and positive ways to try to manage them.       Therapeutic Modalities:   Cognitive Behavioral Therapy Feelings Identification Dialectical Behavioral Therapy  Daleen SquibbGreg Reighlynn Swiney, LCSW

## 2017-09-14 NOTE — Tx Team (Signed)
Interdisciplinary Treatment and Diagnostic Plan Update  09/14/2017 Time of Session: 10:30am Anthony [name] MRN: [id number]  Principal Diagnosis: Bipolar I disorder, most recent episode depressed, severe without psychotic features (HCC)  Secondary Diagnoses: Principal Problem:   Bipolar I disorder, most recent episode depressed, severe without psychotic features (HCC) Active Problems:   Suicidal ideation   History of encephalitis   Cannabis use disorder, moderate, dependence (HCC)   Tobacco use disorder   COPD (chronic obstructive pulmonary disease) (HCC)   GERD (gastroesophageal reflux disease)   Current Medications:  Current Facility-Administered Medications  Medication Dose Route Frequency Provider Last Rate Last Dose  . acetaminophen (TYLENOL) tablet 650 mg  650 mg Oral Q6H PRN Pucilowska, Jolanta B, MD      . albuterol (PROVENTIL HFA;VENTOLIN HFA) 108 (90 Base) MCG/ACT inhaler 2 puff  2 puff Inhalation Q6H PRN Pucilowska, Jolanta B, MD      . alum & mag hydroxide-simeth (MAALOX/MYLANTA) 200-200-20 MG/5ML suspension 30 mL  30 mL Oral Q4H PRN Pucilowska, Jolanta B, MD      . diclofenac sodium (VOLTAREN) 1 % transdermal gel 4 g  4 g Topical QID Pucilowska, Jolanta B, MD   4 g at 09/14/17 1234  . gabapentin (NEURONTIN) tablet 300 mg  300 mg Oral TID Pucilowska, Jolanta B, MD   300 mg at 09/14/17 1233  . hydrOXYzine (ATARAX/VISTARIL) tablet 50 mg  50 mg Oral TID PRN Pucilowska, Jolanta B, MD   50 mg at 09/13/17 2150  . lithium carbonate capsule 300 mg  300 mg Oral TID WC Pucilowska, Jolanta B, MD   300 mg at 09/14/17 1233  . magnesium hydroxide (MILK OF MAGNESIA) suspension 30 mL  30 mL Oral Daily PRN Pucilowska, Jolanta B, MD      . nicotine (NICODERM CQ - dosed in mg/24 hours) patch 21 mg  21 mg Transdermal Q0600 Pucilowska, Jolanta B, MD   21 mg at 09/14/17 0908  . pantoprazole (PROTONIX) EC tablet 40 mg  40 mg Oral Daily Pucilowska, Jolanta B, MD   40 mg at 09/14/17 0909  .  QUEtiapine (SEROQUEL) tablet 50 mg  50 mg Oral QHS Pucilowska, Jolanta B, MD   50 mg at 09/13/17 2150  . venlafaxine XR (EFFEXOR-XR) 24 hr capsule 150 mg  150 mg Oral Q breakfast Pucilowska, Jolanta B, MD   150 mg at 09/14/17 0909   PTA Medications: Prescriptions Prior to Admission  Medication Sig Dispense Refill Last Dose  . albuterol (PROVENTIL HFA;VENTOLIN HFA) 108 (90 Base) MCG/ACT inhaler Inhale 1 puff into the lungs every 6 (six) hours as needed for wheezing or shortness of breath.   rescue at rescue  . azithromycin (ZITHROMAX) 250 MG tablet Take 1 tablet (250 mg total) by mouth daily. Take first 2 tablets together, then 1 every day until finished. 6 tablet 0   . fluticasone (FLONASE) 50 MCG/ACT nasal spray Place 1 spray into both nostrils daily. 1 g 1 Past Week at Unknown time  . fluticasone furoate-vilanterol (BREO ELLIPTA) 200-25 MCG/INH AEPB Inhale 1 puff into the lungs daily.   Past Week at Unknown time  . gabapentin (NEURONTIN) 100 MG capsule Take 2 capsules (200 mg total) by mouth 2 (two) times daily. (Patient taking differently: Take 300 mg by mouth 2 (two) times daily. ) 60 capsule 0 Past Month at Unknown time  . gabapentin (NEURONTIN) 300 MG capsule Take 300 mg by mouth 3 (three) times daily.   04/09/2017 at Unknown time  . hydrOXYzine (ATARAX/VISTARIL) 50 MG  tablet Take 1 tablet (50 mg total) by mouth 3 (three) times daily as needed for anxiety or itching. 30 tablet 0 Past Month at Unknown time  . loratadine (CLARITIN) 10 MG tablet Take 1 tablet (10 mg total) by mouth daily. 30 tablet 0 Past Week at Unknown time  . predniSONE (DELTASONE) 20 MG tablet Take 40 mg by mouth daily for 3 days, then 20mg  by mouth daily for 3 days, then 10mg  daily for 3 days 12 tablet 0   . QUEtiapine (SEROQUEL) 50 MG tablet Take 1 tablet (50 mg total) by mouth at bedtime. 30 tablet 0 Past Month at Unknown time  . venlafaxine XR (EFFEXOR-XR) 75 MG 24 hr capsule Take 3 capsules (225 mg total) by mouth daily  with breakfast. 30 capsule 0 Past Month at Unknown time    Patient Stressors: Financial difficulties Substance abuse  Patient Strengths: Motivation for treatment/growth Supportive family/friends  Treatment Modalities: Medication Management, Group therapy, Case management,  1 to 1 session with clinician, Psychoeducation, Recreational therapy.   Physician Treatment Plan for Primary Diagnosis: Bipolar I disorder, most recent episode depressed, severe without psychotic features (HCC) Long Term Goal(s): Improvement in symptoms so as ready for discharge Improvement in symptoms so as ready for discharge   Short Term Goals: Ability to identify changes in lifestyle to reduce recurrence of condition will improve Ability to verbalize feelings will improve Ability to disclose and discuss suicidal ideas Ability to demonstrate self-control will improve Ability to identify and develop effective coping behaviors will improve Ability to identify triggers associated with substance abuse/mental health issues will improve Ability to identify changes in lifestyle to reduce recurrence of condition will improve Ability to demonstrate self-control will improve Ability to identify triggers associated with substance abuse/mental health issues will improve  Medication Management: Evaluate patient's response, side effects, and tolerance of medication regimen.  Therapeutic Interventions: 1 to 1 sessions, Unit Group sessions and Medication administration.  Evaluation of Outcomes: Progressing  Physician Treatment Plan for Secondary Diagnosis: Principal Problem:   Bipolar I disorder, most recent episode depressed, severe without psychotic features (HCC) Active Problems:   Suicidal ideation   History of encephalitis   Cannabis use disorder, moderate, dependence (HCC)   Tobacco use disorder   COPD (chronic obstructive pulmonary disease) (HCC)   GERD (gastroesophageal reflux disease)  Long Term Goal(s):  Improvement in symptoms so as ready for discharge Improvement in symptoms so as ready for discharge   Short Term Goals: Ability to identify changes in lifestyle to reduce recurrence of condition will improve Ability to verbalize feelings will improve Ability to disclose and discuss suicidal ideas Ability to demonstrate self-control will improve Ability to identify and develop effective coping behaviors will improve Ability to identify triggers associated with substance abuse/mental health issues will improve Ability to identify changes in lifestyle to reduce recurrence of condition will improve Ability to demonstrate self-control will improve Ability to identify triggers associated with substance abuse/mental health issues will improve     Medication Management: Evaluate patient's response, side effects, and tolerance of medication regimen.  Therapeutic Interventions: 1 to 1 sessions, Unit Group sessions and Medication administration.  Evaluation of Outcomes: Progressing   RN Treatment Plan for Primary Diagnosis: Bipolar I disorder, most recent episode depressed, severe without psychotic features (HCC) Long Term Goal(s): Knowledge of disease and therapeutic regimen to maintain health will improve  Short Term Goals: Ability to remain free from injury will improve, Ability to demonstrate self-control, Ability to identify and develop effective coping behaviors  will improve and Compliance with prescribed medications will improve  Medication Management: RN will administer medications as ordered by provider, will assess and evaluate patient's response and provide education to patient for prescribed medication. RN will report any adverse and/or side effects to prescribing provider.  Therapeutic Interventions: 1 on 1 counseling sessions, Psychoeducation, Medication administration, Evaluate responses to treatment, Monitor vital signs and CBGs as ordered, Perform/monitor CIWA, COWS, AIMS and Fall  Risk screenings as ordered, Perform wound care treatments as ordered.  Evaluation of Outcomes: Progressing   LCSW Treatment Plan for Primary Diagnosis: Bipolar I disorder, most recent episode depressed, severe without psychotic features (HCC) Long Term Goal(s): Safe transition to appropriate next level of care at discharge, Engage patient in therapeutic group addressing interpersonal concerns.  Short Term Goals: Engage patient in aftercare planning with referrals and resources, Increase ability to appropriately verbalize feelings, Identify triggers associated with mental health/substance abuse issues and Increase skills for wellness and recovery  Therapeutic Interventions: Assess for all discharge needs, 1 to 1 time with Social worker, Explore available resources and support systems, Assess for adequacy in community support network, Educate family and significant other(s) on suicide prevention, Complete Psychosocial Assessment, Interpersonal group therapy.  Evaluation of Outcomes: Progressing   Progress in Treatment: Attending groups: No Participating in groups: No. Taking medication as prescribed: Yes. Toleration medication: Yes. Family/Significant other contact made: Yes, individual(s) contacted:    Patient understands diagnosis: Yes. Discussing patient identified problems/goals with staff: Yes. Medical problems stabilized or resolved: Yes. Denies suicidal/homicidal ideation: No. Issues/concerns per patient self-inventory: No. Other:    New problem(s) identified: No, Describe:     New Short Term/Long Term Goal(s):  Discharge Plan or Barriers:   Reason for Continuation of Hospitalization: Depression Medication stabilization Suicidal ideation  Estimated Length of Stay:  Attendees: Patient:Anthony [name] 09/14/2017 2:49 PM  Physician: Braulio ConteJolanta Pucilowska 09/14/2017 2:49 PM  Nursing: Leonia ReaderPhyllis Cobb, RN 09/14/2017 2:49 PM  RN Care Manager: 09/14/2017 2:49 PM  Social Worker:  Jake SharkSara Koreen Lizaola, LCSW 09/14/2017 2:49 PM  Recreational Therapist:  09/14/2017 2:49 PM  Other:  09/14/2017 2:49 PM  Other:  09/14/2017 2:49 PM  Other: 09/14/2017 2:49 PM    Scribe for Treatment Team: Glennon MacSara P Alba Perillo, LCSW 09/14/2017 2:49 PM

## 2017-09-14 NOTE — [facility] Group Notes (Signed)
[facility] Group Notes:  (Nursing/MHT/Case Management/Adjunct)  Date:  09/14/2017  Time:  12:34 AM  Type of Therapy:  Wrap-Up Group  Participation Level:  Active  Participation Quality:  Appropriate  Affect:  Appropriate  Cognitive:  Appropriate  Insight:  Appropriate  Engagement in Group:  Engaged  Modes of Intervention:  Discussion, Socialization and Support  Summary of Progress/Problems:  Lynelle SmokeCara Travis Yevette Knust 09/14/2017, 12:34 AM

## 2017-09-14 NOTE — Progress Notes (Signed)
[facility] MD Progress Note  09/14/2017 9:37 AM Karlo Goeden  MRN:  [id number]  Subjective:   Mr. Cisnero met with treatment team today. He reports small improvement but could not be more specific. He is quick to point out that he still has suicidal thoughts. He acepts medications and tolerates them well. Slept less that 5 hours but refuses a sleeping aid or incresae in Seroquel. Good group participation. In spite of his disability (impaired gait and shoulder injury, he has no complaits of pain.  Treatment plan. We will continue Effexor, Seroquel and lithium for depression and mood stabilization. Lithium level will be checked in couple of days.  Social/disposition. The patient has been living in a boarding house but did not receive Social Security disability check last month. He is afraid that he will loose his housing. Social worker will assist the patient in calling Social Security disability office.  Past psychiatry history. There is a history of bipolar disorder with prior hospitalization and 2 suicide attempts. He does have outpatient psychiatrist. He has been compliant with prescribed medication.  Family psychiatric history. Nonreported.    Principal Problem: Bipolar I disorder, most recent episode depressed, severe without psychotic features ([place]) Diagnosis:   Patient Active Problem List   Diagnosis Date Noted  . COPD (chronic obstructive pulmonary disease) ([place]) [J44.9] 09/13/2017  . GERD (gastroesophageal reflux disease) [K21.9] 09/13/2017  . Cannabis use disorder, moderate, dependence ([place]) [F12.20] 09/12/2017  . Tobacco use disorder [F17.200] 09/12/2017  . Cocaine use disorder, severe, dependence ([place]) [F14.20] 01/03/2017  . History of encephalitis [Z86.61] 12/10/2016  . Bipolar I disorder, most recent episode depressed, severe without psychotic features ([place]) [F31.4] 12/09/2016  . Allergic reaction [T78.40XA]   . Rash [R21]   . Suicidal ideation [R45.851]   . PTSD  (post-traumatic stress disorder) [F43.10] 09/12/2014  . Episodic mood disorder ([place]) [F39] 09/11/2014   Total Time spent with patient: 30 minutes  Past Medical History:  Past Medical History:  Diagnosis Date  . Bell's palsy   . Cannabis abuse   . Cellulitis of right leg 04/2017  . Chronic left shoulder pain   . Depression   . Encephalitis   . Episodic mood disorder ([place])   . Homelessness   . Post traumatic stress disorder   . Seizures ([place])    last seizure 12 yrs ago  . Sepsis ([place]) 04/2017    Past Surgical History:  Procedure Laterality Date  . APPENDECTOMY    . brain damage    . CHOLECYSTECTOMY    . FOOT SURGERY Bilateral   . KNEE SURGERY Right    Family History:  Family History  Problem Relation Age of Onset  . Cancer Mother   . Heart attack Father   . Mental illness Sister   . Alcoholism Brother    Social History:  History  Alcohol Use No     History  Drug Use  . Frequency: 2.0 times per week  . Types: Marijuana    Comment: Weekly use + THC    Social History   Social History  . Marital status: Divorced    Spouse name: N/A  . Number of children: N/A  . Years of education: N/A   Social History Main Topics  . Smoking status: Current Every Day Smoker    Packs/day: 1.00    Years: 38.00    Types: Cigarettes  . Smokeless tobacco: Never Used  . Alcohol use No  . Drug use: Yes    Frequency: 2.0 times per  week    Types: Marijuana     Comment: Weekly use + THC  . Sexual activity: Yes    Birth control/ protection: None   Other Topics Concern  . None   Social History Narrative  . None   Additional Social History:                         Sleep: Fair  Appetite:  Fair  Current Medications: Current Facility-Administered Medications  Medication Dose Route Frequency Provider Last Rate Last Dose  . acetaminophen (TYLENOL) tablet 650 mg  650 mg Oral Q6H PRN Pucilowska, Jolanta B, MD      . albuterol (PROVENTIL HFA;VENTOLIN HFA) 108 (90  Base) MCG/ACT inhaler 2 puff  2 puff Inhalation Q6H PRN Pucilowska, Jolanta B, MD      . alum & mag hydroxide-simeth (MAALOX/MYLANTA) 200-200-20 MG/5ML suspension 30 mL  30 mL Oral Q4H PRN Pucilowska, Jolanta B, MD      . diclofenac sodium (VOLTAREN) 1 % transdermal gel 4 g  4 g Topical QID Pucilowska, Jolanta B, MD   4 g at 09/14/17 0910  . gabapentin (NEURONTIN) tablet 300 mg  300 mg Oral TID Pucilowska, Jolanta B, MD   300 mg at 09/14/17 0905  . hydrOXYzine (ATARAX/VISTARIL) tablet 50 mg  50 mg Oral TID PRN Pucilowska, Jolanta B, MD   50 mg at 09/13/17 2150  . lithium carbonate capsule 300 mg  300 mg Oral TID WC Pucilowska, Jolanta B, MD   300 mg at 09/14/17 0906  . magnesium hydroxide (MILK OF MAGNESIA) suspension 30 mL  30 mL Oral Daily PRN Pucilowska, Jolanta B, MD      . nicotine (NICODERM CQ - dosed in mg/24 hours) patch 21 mg  21 mg Transdermal Q0600 Pucilowska, Jolanta B, MD   21 mg at 09/14/17 0908  . pantoprazole (PROTONIX) EC tablet 40 mg  40 mg Oral Daily Pucilowska, Jolanta B, MD   40 mg at 09/14/17 0909  . QUEtiapine (SEROQUEL) tablet 50 mg  50 mg Oral QHS Pucilowska, Jolanta B, MD   50 mg at 09/13/17 2150  . venlafaxine XR (EFFEXOR-XR) 24 hr capsule 150 mg  150 mg Oral Q breakfast Pucilowska, Jolanta B, MD   150 mg at 09/14/17 0909    Lab Results:  Results for orders placed or performed during the hospital encounter of 09/12/17 (from the past 48 hour(s))  Hemoglobin A1c     Status: None   Collection Time: 09/13/17  7:00 AM  Result Value Ref Range   Hgb A1c MFr Bld 5.4 4.8 - 5.6 %    Comment: (NOTE) Pre diabetes:          5.7%-6.4% Diabetes:              >6.4% Glycemic control for   <7.0% adults with diabetes    Mean Plasma Glucose 108.28 mg/dL    Comment: Performed at [facility], [place] [address], [place], [place] [address]  Lipid panel     Status: None   Collection Time: 09/13/17  7:00 AM  Result Value Ref Range   Cholesterol 166 0 - 200 mg/dL   Triglycerides 46  <150 mg/dL   HDL 70 >40 mg/dL   Total CHOL/HDL Ratio 2.4 RATIO   VLDL 9 0 - 40 mg/dL   LDL Cholesterol 87 0 - 99 mg/dL    Comment:        Total Cholesterol/HDL:CHD Risk Coronary Heart Disease  Risk Table                     Men   Women  1/2 Average Risk   3.4   3.3  Average Risk       5.0   4.4  2 X Average Risk   9.6   7.1  3 X Average Risk  23.4   11.0        Use the calculated Patient Ratio above and the CHD Risk Table to determine the patient's CHD Risk.        ATP III CLASSIFICATION (LDL):  <100     mg/dL   Optimal  100-129  mg/dL   Near or Above                    Optimal  130-159  mg/dL   Borderline  160-189  mg/dL   High  >190     mg/dL   Very High   TSH     Status: None   Collection Time: 09/13/17  7:00 AM  Result Value Ref Range   TSH 0.871 0.350 - 4.500 uIU/mL    Comment: Performed by a 3rd Generation assay with a functional sensitivity of <=0.01 uIU/mL.    Blood Alcohol level:  Lab Results  Component Value Date   ETH <10 09/11/2017   ETH <5 62/70/3500    Metabolic Disorder Labs: Lab Results  Component Value Date   HGBA1C 5.4 09/13/2017   MPG 108.28 09/13/2017   MPG 117 12/11/2016   Lab Results  Component Value Date   PROLACTIN 15.5 (H) 12/11/2016   Lab Results  Component Value Date   CHOL 166 09/13/2017   TRIG 46 09/13/2017   HDL 70 09/13/2017   CHOLHDL 2.4 09/13/2017   VLDL 9 09/13/2017   LDLCALC 87 09/13/2017   LDLCALC 69 12/11/2016    Physical Findings: AIMS:  , ,  ,  ,    CIWA:  CIWA-Ar Total: 2 COWS:  COWS Total Score: 0  Musculoskeletal: Strength & Muscle Tone: within normal limits Gait & Station: broad based Patient leans: N/A  Psychiatric Specialty Exam: Physical Exam  Nursing note and vitals reviewed. Psychiatric: His speech is normal. His affect is blunt. He is withdrawn. Cognition and memory are normal. He expresses impulsivity. He exhibits a depressed mood. He expresses suicidal ideation. He expresses suicidal plans.     Review of Systems  Musculoskeletal: Positive for joint pain.  Psychiatric/Behavioral: Positive for depression and suicidal ideas. The patient is nervous/anxious and has insomnia.   All other systems reviewed and are negative.   Blood pressure 129/79, pulse (!) 58, temperature (!) 97.5 F (36.4 C), temperature source Oral, resp. rate 18, height _0  (1.753 m), weight 93 kg (205 lb), SpO2 100 %.Body mass index is 30.27 kg/m.  General Appearance: Casual  Eye Contact:  Good  Speech:  Clear and Coherent  Volume:  Normal  Mood:  Anxious and Depressed  Affect:  Blunt  Thought Process:  Goal Directed and Descriptions of Associations: Intact  Orientation:  Full (Time, Place, and Person)  Thought Content:  WDL  Suicidal Thoughts:  Yes.  with intent/plan  Homicidal Thoughts:  No  Memory:  Immediate;   Fair Recent;   Fair Remote;   Fair  Judgement:  Impaired  Insight:  Shallow  Psychomotor Activity:  Psychomotor Retardation  Concentration:  Concentration: Fair and Attention Span: Fair  Recall:  [employer] of Knowledge:  Fair  Language:  Fair  Akathisia:  No  Handed:  Right  AIMS (if indicated):     Assets:  Communication Skills Desire for Improvement Financial Resources/Insurance Housing Physical Health Resilience Social Support  ADL's:  Intact  Cognition:  WNL  Sleep:  Number of Hours: 4.75     Treatment Plan Summary: Daily contact with patient to assess and evaluate symptoms and progress in treatment and Medication management    Mr. Mitch is a 56 year old male with a history of bipolar disorder admitted for suicidal ideation with a plan to overdose in the context of severe social stressors.  # Suicidal ideation - The patient is able to contract for safety in the hospital  # Mood - Continue Effexor 150 mg daily - Continue Seroquel  - Started lithium 300 mg 3 times a day  # Insomnia - Slepy less than 5 hours but declines sleeping aid  # Anxiety - Continue  hydroxyzine 50 mg 3 times a day  # Chronic pain - continue Neurontin 300 mg 3 times daily  # COPD - Continue albuterol  # smoking - Nicotine patch is available  # metabolic syndrome monitoring - Lipid panel, TSH, hemoglobin A1c are normal  # EKG pending  # GERD - Start Protonix  # Disposition - Discharge back to his boarding house - Follow-up with his primary psychiatrist  Orson Slick, MD 09/14/2017, 9:37 AM

## 2017-09-14 NOTE — Progress Notes (Signed)
D: Pt is alert and oriented x 3 with periods of confusion to situation. Patient denies SI/HI/AVH. Patient's affect is flat but brightens upon approach, mood is pleasant and he is  cooperative with treatment plan. Patient appears less anxious and he is interacting with peers and staff appropriately.  A: Pt was offered support and encouragement. Pt was given scheduled medications. Pt was encouraged to attend groups. Q 15 minute checks were done for safety.  R:Pt attends groups and interacts well with peers and staff. Pt is complaint with  medication. Pt receptive to treatment and safety maintained on unit, will continue to monitor.

## 2017-09-14 NOTE — [facility] Group Notes (Signed)
[facility] Group Notes:  (Nursing/MHT/Case Management/Adjunct)  Date:  09/14/2017  Time:  10:45 AM  Type of Therapy:  Psychoeducational Skills  Participation Level:  Did Not Attend  PSummary of Progress/Problems:  Anthony [name] Anthony [name] 09/14/2017, 10:45 AM

## 2017-09-14 NOTE — Progress Notes (Signed)
Patient ID: [place] [name], male   DOB: [birth year], 56 y.o.   MRN: [id number] CSW sat with Pt for over an hour to allow him to speak with Social Security Office.  They would not assist him at this time though as he could not remember his old address and had not updated the new one.  He says she instructed him that he must call the local office number and get his address updated before she would be able to update him on the status of his check.  Jake SharkSara Peyten Punches, LCSW

## 2017-09-14 NOTE — Plan of Care (Signed)
Problem: Activity: Goal: Interest or engagement in leisure activities will improve Outcome: Progressing Attending groups.  Visible in the milieu.  Interacting with peers appropriately.

## 2017-09-14 NOTE — Progress Notes (Signed)
Rates depression as 10/10, anxiety as 10/10 and hopelessness 7/10.  Endorses SI with plan to hang self or to walk in front of a bus.  Attending groups.  Visible in the milieu.  Speech slurred at times.  The longer the conversation the more slurred speech becomes.  Support and encouragement offered.  Safety checks maintained.

## 2017-09-15 MED ORDER — LORATADINE 10 MG PO TABS
10.0000 mg | ORAL_TABLET | Freq: Every day | ORAL | Status: DC
  Administered 2017-09-15 – 2017-09-19 (×5): 10 mg via ORAL
  Filled 2017-09-15 (×5): qty 1

## 2017-09-15 MED ORDER — AZITHROMYCIN 500 MG PO TABS
500.0000 mg | ORAL_TABLET | Freq: Every day | ORAL | Status: AC
  Administered 2017-09-15: 500 mg via ORAL
  Filled 2017-09-15: qty 1

## 2017-09-15 MED ORDER — AZITHROMYCIN 250 MG PO TABS
250.0000 mg | ORAL_TABLET | Freq: Every day | ORAL | Status: AC
  Administered 2017-09-16 – 2017-09-19 (×4): 250 mg via ORAL
  Filled 2017-09-15 (×4): qty 1

## 2017-09-15 MED ORDER — HYDROXYZINE HCL 50 MG PO TABS
50.0000 mg | ORAL_TABLET | Freq: Three times a day (TID) | ORAL | Status: DC
  Administered 2017-09-15 – 2017-09-19 (×12): 50 mg via ORAL
  Filled 2017-09-15 (×12): qty 1

## 2017-09-15 MED ORDER — FLUTICASONE PROPIONATE 50 MCG/ACT NA SUSP
2.0000 | Freq: Every day | NASAL | Status: DC
  Administered 2017-09-15 – 2017-09-19 (×5): 2 via NASAL
  Filled 2017-09-15: qty 16

## 2017-09-15 NOTE — [facility] Group Notes (Signed)
[facility] Group Notes:  (Nursing/MHT/Case Management/Adjunct)  Date:  09/15/2017  Time:  9:24 PM  Type of Therapy:  Group Therapy  Participation Level:  Active  Participation Quality:  Appropriate  Affect:  Appropriate  Cognitive:  Appropriate  Insight:  Good  Engagement in Group:  Engaged  Modes of Intervention:  Support  Summary of Progress/Problems:  Anthony NeerJackie L Esmeralda Malay 09/15/2017, 9:24 PM

## 2017-09-15 NOTE — Progress Notes (Signed)
[facility] MD Progress Note  09/15/2017 5:44 PM Anthony [name]  MRN:  [id number]  Subjective:   Anthony [name] is depressed, wants to kill himself and demand ECT treatment. He does not need ECT. In any case we will not be able to provide it sooner than in one month. The patient is pretty dramatic. Financialproblems are the major stressor. The patient did not receive his disability checl last month. In spite of vigorous efforts, we were unable to solve the mistery. Apparently, he will have to go in person to [facility] office to verify his address. This can not be done on the phone. The patient is quite social on the unit. Sleep and appetite are good. He participates in programming. He complains of uper respiratory infection.  Treatment plan. We will continue Effexor, low dose Seroquel and lithium for depression and mood stabilization. Lithium level tomorrow. We changed Vistaril to a standing order for anxiety. The patient understands that no ECT will be offered.   Social/Disposition. Apparently, the patient may return to his boarding house until the end of the month. He needs to go to [facility] office as soon as possible as well. He will follow up with his regular psychiatrist.  Past psychiatric history. Self reported history of bipolar with two suicide attempts.   Family psychiatric history. Nonreported.   Principal Problem: Bipolar I disorder, most recent episode depressed, severe without psychotic features (HCC) Diagnosis:   Patient Active Problem List   Diagnosis Date Noted  . COPD (chronic obstructive pulmonary disease) (HCC) [J44.9] 09/13/2017  . GERD (gastroesophageal reflux disease) [K21.9] 09/13/2017  . Cannabis use disorder, moderate, dependence (HCC) [F12.20] 09/12/2017  . Tobacco use disorder [F17.200] 09/12/2017  . Cocaine use disorder, severe, dependence (HCC) [F14.20] 01/03/2017  . History of encephalitis [Z86.61] 12/10/2016  . Bipolar I disorder, most recent episode depressed, severe without  psychotic features (HCC) [F31.4] 12/09/2016  . Allergic reaction [T78.40XA]   . Rash [R21]   . Suicidal ideation [R45.851]   . PTSD (post-traumatic stress disorder) [F43.10] 09/12/2014  . Episodic mood disorder (HCC) [F39] 09/11/2014   Total Time spent with patient: 30 minutes  Past Medical History:  Past Medical History:  Diagnosis Date  . Bell's palsy   . Cannabis abuse   . Cellulitis of right leg 04/2017  . Chronic left shoulder pain   . Depression   . Encephalitis   . Episodic mood disorder (HCC)   . Homelessness   . Post traumatic stress disorder   . Seizures (HCC)    last seizure 12 yrs ago  . Sepsis (HCC) 04/2017    Past Surgical History:  Procedure Laterality Date  . APPENDECTOMY    . brain damage    . CHOLECYSTECTOMY    . FOOT SURGERY Bilateral   . KNEE SURGERY Right    Family History:  Family History  Problem Relation Age of Onset  . Cancer Mother   . Heart attack Father   . Mental illness Sister   . Alcoholism Brother    Social History:  History  Alcohol Use No     History  Drug Use  . Frequency: 2.0 times per week  . Types: Marijuana    Comment: Weekly use + THC    Social History   Social History  . Marital status: Divorced    Spouse name: N/A  . Number of children: N/A  . Years of education: N/A   Social History Main Topics  . Smoking status: Current Every Day Smoker  Packs/day: 1.00    Years: 38.00    Types: Cigarettes  . Smokeless tobacco: Never Used  . Alcohol use No  . Drug use: Yes    Frequency: 2.0 times per week    Types: Marijuana     Comment: Weekly use + THC  . Sexual activity: Yes    Birth control/ protection: None   Other Topics Concern  . None   Social History Narrative  . None   Additional Social History:                         Sleep: Fair  Appetite:  Fair  Current Medications: Current Facility-Administered Medications  Medication Dose Route Frequency Provider Last Rate Last Dose  .  acetaminophen (TYLENOL) tablet 650 mg  650 mg Oral Q6H PRN Brynden Thune B, MD      . albuterol (PROVENTIL HFA;VENTOLIN HFA) 108 (90 Base) MCG/ACT inhaler 2 puff  2 puff Inhalation Q6H PRN Chavonne Sforza B, MD      . alum & mag hydroxide-simeth (MAALOX/MYLANTA) 200-200-20 MG/5ML suspension 30 mL  30 mL Oral Q4H PRN Dorien Mayotte B, MD      . Melene Muller[START ON 09/16/2017] azithromycin (ZITHROMAX) tablet 250 mg  250 mg Oral Daily Dayona Shaheen B, MD      . diclofenac sodium (VOLTAREN) 1 % transdermal gel 4 g  4 g Topical QID Chrisma Hurlock B, MD   4 g at 09/15/17 1720  . fluticasone (FLONASE) 50 MCG/ACT nasal spray 2 spray  2 spray Each Nare Daily Azjah Pardo B, MD   2 spray at 09/15/17 1718  . gabapentin (NEURONTIN) tablet 300 mg  300 mg Oral TID Kenzie Flakes B, MD   300 mg at 09/15/17 1718  . hydrOXYzine (ATARAX/VISTARIL) tablet 50 mg  50 mg Oral TID Jarryd Gratz B, MD   50 mg at 09/15/17 1718  . lithium carbonate capsule 300 mg  300 mg Oral TID WC Armarion Greek B, MD   300 mg at 09/15/17 1718  . loratadine (CLARITIN) tablet 10 mg  10 mg Oral Daily Taesean Reth B, MD   10 mg at 09/15/17 1326  . magnesium hydroxide (MILK OF MAGNESIA) suspension 30 mL  30 mL Oral Daily PRN Alaja Goldinger B, MD      . nicotine (NICODERM CQ - dosed in mg/24 hours) patch 21 mg  21 mg Transdermal Q0600 Itzel Mckibbin B, MD   21 mg at 09/15/17 0925  . pantoprazole (PROTONIX) EC tablet 40 mg  40 mg Oral Daily Rocquel Askren B, MD   40 mg at 09/15/17 0924  . QUEtiapine (SEROQUEL) tablet 50 mg  50 mg Oral QHS Merna Baldi B, MD   50 mg at 09/14/17 2145  . venlafaxine XR (EFFEXOR-XR) 24 hr capsule 150 mg  150 mg Oral Q breakfast Savannah Erbe B, MD   150 mg at 09/15/17 [id number]    Lab Results:  No results found for this or any previous visit (from the past 48 hour(s)).  Blood Alcohol level:  Lab Results  Component Value Date   [facility]  [facility]   ETH <5 04/10/2017    Metabolic Disorder Labs: Lab Results  Component Value Date   HGBA1C 5.4 09/13/2017   MPG 108.28 09/13/2017   MPG 117 12/11/2016   Lab Results  Component Value Date   PROLACTIN 15.5 (H) 12/11/2016   Lab Results  Component Value Date   CHOL 166 09/13/2017   TRIG  46 09/13/2017   HDL 70 09/13/2017   CHOLHDL 2.4 09/13/2017   VLDL 9 09/13/2017   LDLCALC 87 09/13/2017   LDLCALC 69 12/11/2016    Physical Findings: AIMS:  , ,  ,  ,    CIWA:  CIWA-Ar Total: 2 COWS:  COWS Total Score: 0  Musculoskeletal: Strength & Muscle Tone: within normal limits Gait & Station: broad based Patient leans: N/A  Psychiatric Specialty Exam: Physical Exam  Nursing note and vitals reviewed. Psychiatric: His speech is normal and behavior is normal. Cognition and memory are normal. He expresses impulsivity. He exhibits a depressed mood. He expresses suicidal ideation. He expresses suicidal plans.    Review of Systems  Musculoskeletal: Positive for joint pain.  Psychiatric/Behavioral: Positive for depression and suicidal ideas. The patient is nervous/anxious and has insomnia.   All other systems reviewed and are negative.   Blood pressure (!) 148/82, pulse (!) 56, temperature 97.8 F (36.6 C), resp. rate 18, height 5\' 9"  (1.753 m), weight 93 kg (205 lb), SpO2 100 %.Body mass index is 30.27 kg/m.  General Appearance: Casual  Eye Contact:  Good  Speech:  Clear and Coherent  Volume:  Normal  Mood:  Anxious and Depressed  Affect:  Full Range  Thought Process:  Goal Directed and Descriptions of Associations: Intact  Orientation:  Full (Time, Place, and Person)  Thought Content:  WDL  Suicidal Thoughts:  Yes.  with intent/plan  Homicidal Thoughts:  No  Memory:  Immediate;   Fair Recent;   Fair Remote;   Fair  Judgement:  Impaired  Insight:  Shallow  Psychomotor Activity:  Normal  Concentration:  Concentration: Fair and Attention Span: Fair  Recall:  [employer] of Knowledge:  Fair  Language:  Fair  Akathisia:  No  Handed:  Right  AIMS (if indicated):     Assets:  Communication Skills Desire for Improvement Resilience Social Support  ADL's:  Intact  Cognition:  WNL  Sleep:  Number of Hours: 5.45     Treatment Plan Summary: Daily contact with patient to assess and evaluate symptoms and progress in treatment and Medication management    Mr. Mccaughey is a 56 year old male with a history of bipolar disorder admitted for suicidal ideation with a plan to overdose in the context of severe social stressors.  # Suicidal ideation - The patient is able to contract for safety in the hospital  # Mood - Continue Effexor 150 mg daily - Continue Seroquel 50 mg nightly - Continue Lithium 300 mg TID, level in am  # Insomnia - Resolved with Seroquel  # Anxiety - Give Hydroxyzine 50 mg TID   # Chronic pain - continue Neurontin 300 mg 3 times daily  # COPD exacerbation - Continue albuterol - Start Azithromycin 500 mg   # smoking - Nicotine patch is available  # metabolic syndrome monitoring - Lipid panel, TSH, hemoglobin A1c are normal  # EKG QTc 392  # GERD - Start Protonix  # Disposition - Discharge back to his boarding house - Follow-up with his primary psychiatrist  Kristine Linea, MD 09/15/2017, 5:44 PM

## 2017-09-15 NOTE — Progress Notes (Signed)
Pt very assertive. Interacting appropriately with staff and peers. Pt speech is slurred and unable to understand pt at times. Pt continues to be depressed 7/7. Pt says he got so much going on. Pt was playing cards in dayroom with peers. Med compliant, no adverse affects noted. No distress noted. Passive SI, denies HI and A/V hallucinations at this time. Contracted to safety. 15 min safety checks continues.

## 2017-09-15 NOTE — Progress Notes (Signed)
Patient irritable first am when wanted to make a phone call when phones were being turned off.  Shouted "if you turn phones off will tear them off the wall"  Explained to patient about the phone hours and that the phones would be back on at 12:00 and patient verbalized understanding.  When this writer returned to nurses station pateint states "I will tear something up"  Informed patient that is not acceptable behavior.  Patient apologized.  Patient attended 2 groups today.  Medication compliant. Interacts with peers appropriately.  Support and encouragement offered.  Safety maintained on the unit.l

## 2017-09-15 NOTE — Social Work (Signed)
CSW spent approx one hour w patient attempting to assist w access to social security benefits which patient states he has not received in past month and therefore cannot return to boarding house upon discharge.  [facility] has [facility] ([id number] [facility]).  CSW requested [facility] care coordinator be assigned to assist w discharge planning.  Per [facility], patient went to [facility] substance abuse residential treatment in [place] [facility].  From there he has also lived in an assisted living faciilty in [place] [address], [facility] (cannot return for another month), [facility] as he was unable to walk after experiencing cellulitis.  Most [facility] are [id number], [place] and [address], [place].  CSW Laws tried to assist patient w contacting national SS office to determine status of his check - patient was unable to verify his address sufficiently as he could not remember address - will need to go to local SS office and officially change his address.  Per patient, he gave his SS card to sister Arvilla MarketDenita Sheets [phone]) while [facility] - CSW and patient could not reach sister but did leave message.  CSW has also contacted Mr Ronaldo MiyamotoKyle at local SS office [phone]) but has left several messages, no return call.  Per DSS, only the SS office can confirm status of checks and problem solve, DSS has no record of this information.  Patient aware that he may not be able to resolve his issues w access to SS benefits while inpatient, advised that he may need to go directly to [facility] in person to provide updated address.  Santa GeneraAnne Cunningham, LCSW Lead Clinical Social Worker Phone:  [phone]

## 2017-09-15 NOTE — [facility] Group Notes (Signed)
Goals Group Date/Time: 09/15/2017 9:00 AM Type of Therapy and Topic: Group Therapy: Goals Group: SMART Goals   Participation Level: Moderate  Description of Group:    The purpose of a daily goals group is to assist and guide patients in setting recovery/wellness-related goals. The objective is to set goals as they relate to the crisis in which they were admitted. Patients will be using SMART goal modalities to set measurable goals. Characteristics of realistic goals will be discussed and patients will be assisted in setting and processing how one will reach their goal. Facilitator will also assist patients in applying interventions and coping skills learned in psycho-education groups to the SMART goal and process how one will achieve defined goal.   Therapeutic Goals:   -Patients will develop and document one goal related to or their crisis in which brought them into treatment.  -Patients will be guided by LCSW using SMART goal setting modality in how to set a measurable, attainable, realistic and time sensitive goal.  -Patients will process barriers in reaching goal.  -Patients will process interventions in how to overcome and successful in reaching goal.   Patient's Goal: "feel better, take my medicine, and talk to 10 people today"   Therapeutic Modalities:  Motivational Interviewing  Cognitive Behavioral Therapy  Crisis Intervention Model  SMART goals setting   Anthony SquibbGreg Jalayah Gutridge, LCSW

## 2017-09-15 NOTE — [facility] Suicide Risk Assessment (Signed)
[facility] INPATIENT:  Family/Significant Other Suicide Prevention Education  Suicide Prevention Education:  Education Completed; Arvilla MarketDenita [name], sister, [phone], has been identified by the patient as the family member/significant other with whom the patient will be residing, and identified as the person(s) who will aid the patient in the event of a mental health crisis (suicidal ideations/suicide attempt).  With written consent from the patient, the family member/significant other has been provided the following suicide prevention education, prior to the and/or following the discharge of the patient.  The suicide prevention education provided includes the following:  Suicide risk factors  Suicide prevention and interventions  National Suicide Hotline telephone number  [facility] assessment telephone number  [facility] Emergency Assistance 911  [facility] and/or Residential Mobile Crisis Unit telephone number  Request made of family/significant other to:  Remove weapons (e.g., guns, rifles, knives), all items previously/currently identified as safety concern.    Remove drugs/medications (over-the-counter, prescriptions, illicit drugs), all items previously/currently identified as a safety concern.  The family member/significant other verbalizes understanding of the suicide prevention education information provided.  The family member/significant other agrees to remove the items of safety concern listed above.  Anthony reports that pt has a significant crack cocaine issue and has often spent most of his money on crack cocaine binges.  She and other family members have tried to help multiple times and helped him get set up at his boarding house.  If he is out of money, he most likely spent it on drugs.  She reports she has her own medical issues and can't go through all sort of stress with pt anymore.    Lorri FrederickWierda, Anthony Burgert Jon, LCSW 09/15/2017, 3:42 PM

## 2017-09-15 NOTE — Plan of Care (Signed)
Problem: Safety: Goal: Ability to remain free from injury will improve Outcome: Progressing Remains safe on the unit   

## 2017-09-15 NOTE — [facility] Group Notes (Signed)
[facility] LCSW Group Therapy Note  Date/Time: 09/15/17, 0930  Type of Therapy/Topic:  Group Therapy:  Balance in Life  Participation Level:  moderate  Description of Group:    This group will address the concept of balance and how it feels and looks when one is unbalanced. Patients will be encouraged to process areas in their lives that are out of balance, and identify reasons for remaining unbalanced. Facilitators will guide patients utilizing problem- solving interventions to address and correct the stressor making their life unbalanced. Understanding and applying boundaries will be explored and addressed for obtaining  and maintaining a balanced life. Patients will be encouraged to explore ways to assertively make their unbalanced needs known to significant others in their lives, using other group members and facilitator for support and feedback.  Therapeutic Goals: 1. Patient will identify two or more emotions or situations they have that consume much of in their lives. 2. Patient will identify signs/triggers that life has become out of balance:  3. Patient will identify two ways to set boundaries in order to achieve balance in their lives:  4. Patient will demonstrate ability to communicate their needs through discussion and/or role plays  Summary of Patient Progress:Pt identified physical and mental/emotional as areas that are out of balance.  Pt did contribute to group discussion about ways to handle it when feeling out of balance.          Therapeutic Modalities:   Cognitive Behavioral Therapy Solution-Focused Therapy Assertiveness Training  Daleen SquibbGreg Robby Pirani, [place]
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
## 2017-09-15 NOTE — Plan of Care (Signed)
Problem: Safety: Goal: Ability to remain free from injury will improve Outcome: Progressing Pt will remain injury free the entire shift.   

## 2017-09-16 LAB — LITHIUM LEVEL: LITHIUM LVL: 0.54 mmol/L — AB (ref 0.60–1.20)

## 2017-09-16 MED ORDER — LORATADINE 10 MG PO TABS: 10.0000 mg | ORAL_TABLET | Freq: Every day | ORAL | 1 refills | Status: DC

## 2017-09-16 MED ORDER — ALBUTEROL SULFATE HFA 108 (90 BASE) MCG/ACT IN AERS: 1.0000 | INHALATION_SPRAY | Freq: Four times a day (QID) | RESPIRATORY_TRACT | 1 refills | Status: DC | PRN

## 2017-09-16 MED ORDER — LITHIUM CARBONATE ER 300 MG PO TBCR
600.0000 mg | EXTENDED_RELEASE_TABLET | Freq: Two times a day (BID) | ORAL | Status: DC
  Administered 2017-09-16 – 2017-09-19 (×7): 600 mg via ORAL
  Filled 2017-09-16 (×7): qty 2

## 2017-09-16 MED ORDER — PANTOPRAZOLE SODIUM 40 MG PO TBEC: 40.0000 mg | DELAYED_RELEASE_TABLET | Freq: Every day | ORAL | 1 refills | Status: DC

## 2017-09-16 MED ORDER — LITHIUM CARBONATE ER 300 MG PO TBCR: 600.0000 mg | EXTENDED_RELEASE_TABLET | Freq: Two times a day (BID) | ORAL | 1 refills | Status: DC

## 2017-09-16 MED ORDER — GABAPENTIN 600 MG PO TABS: 300.0000 mg | ORAL_TABLET | Freq: Three times a day (TID) | ORAL | 1 refills | Status: DC

## 2017-09-16 MED ORDER — HYDROXYZINE HCL 50 MG PO TABS: 50.0000 mg | ORAL_TABLET | Freq: Three times a day (TID) | ORAL | 1 refills | Status: DC

## 2017-09-16 MED ORDER — FLUTICASONE PROPIONATE 50 MCG/ACT NA SUSP: 1.0000 | Freq: Every day | NASAL | 1 refills | Status: DC

## 2017-09-16 MED ORDER — VENLAFAXINE HCL ER 75 MG PO CP24: 225.0000 mg | ORAL_CAPSULE | Freq: Every day | ORAL | 1 refills | Status: DC

## 2017-09-16 MED ORDER — QUETIAPINE FUMARATE 50 MG PO TABS: 50.0000 mg | ORAL_TABLET | Freq: Every day | ORAL | 1 refills | Status: DC

## 2017-09-16 MED ORDER — ALBUTEROL SULFATE HFA 108 (90 BASE) MCG/ACT IN AERS: 1.0000 | INHALATION_SPRAY | Freq: Four times a day (QID) | RESPIRATORY_TRACT | 1 refills | Status: AC | PRN

## 2017-09-16 MED ORDER — AZITHROMYCIN 250 MG PO TABS: ORAL_TABLET | ORAL | 0 refills | Status: DC

## 2017-09-16 MED ORDER — VENLAFAXINE HCL ER 75 MG PO CP24
225.0000 mg | ORAL_CAPSULE | Freq: Every day | ORAL | Status: DC
  Administered 2017-09-17 – 2017-09-19 (×3): 225 mg via ORAL
  Filled 2017-09-16 (×3): qty 3

## 2017-09-16 NOTE — Progress Notes (Signed)
CSW spoke to SpringfieldStacy Horne, [phone], care coordinator at [facility], who had called for update. Garner NashGregory Kerwin Augustus, MSW, LCSW Clinical Social Worker 09/16/2017 2:46 PM

## 2017-09-16 NOTE — [facility] Group Notes (Signed)
[facility] Group Notes:  (Nursing/MHT/Case Management/Adjunct)  Date:  09/16/2017  Time:  7:33 PM  Type of Therapy:  Psychoeducational Skills  Participation Level:  Did Not Attend  Anthony CompanionMary  [name] 09/16/2017, 7:33 PM

## 2017-09-16 NOTE — Progress Notes (Signed)
[facility] MD Progress Note  09/16/2017 10:14 AM Kenley Troop  MRN:  [id number]  Subjective:  Mr. Kotlyar again states that he is extremely depressed and still considering suicide. He is overwhelmed with his social situation in spite of new information made available by our diligent social workers. The patient stopped receiving Social Security disability check last month. Apparently he missed some necessary paperwork. He can only do it by going directly to [employer] office. He cannot be done from the hospital. The patient apparently can still return to his boarding house until the end of the month. According to information obtained from the social worker, the patient has been in substance abuse treatment center, [facility] and the homeless shelter in [place]. He is housing situation has been extremely unstable. And is going to be more so since it will take 60-90 days to resolve he is social security disability check problem. The patient complains of back pain and knee pain. He has been participating in programming. Sleep and appetite are fair.  Treatment plan. We increase the Effexor to 225 mg daily, continue Seroquel 50 mg nightly, and increase lithium to 600 mg twice daily as his level was subtherapeutic. The patient desires substance abuse treatment program participation. He was positive for marijuana on admission. His sister believes that he spends his money on cocaine at the beginning of the month.  Social/disposition. The patient still has a place to go in [facility] that is available to him until the end of the month. Most importantly, he needs to go to social security office in person to address problems with his disability check. She will follow up with his regular psychiatrist. The patient is not connected to the [place] even though he is an honorably discharged veteran.   Past psychiatric history. There is a history of bipolar illness with 2 suicide attempts. Also  there is a history of substance use with regard participation.  Family psychiatric history. Nonreported.  Principal Problem: Bipolar I disorder, most recent episode depressed, severe without psychotic features (HCC) Diagnosis:   Patient Active Problem List   Diagnosis Date Noted  . COPD (chronic obstructive pulmonary disease) (HCC) [J44.9] 09/13/2017  . GERD (gastroesophageal reflux disease) [K21.9] 09/13/2017  . Cannabis use disorder, moderate, dependence (HCC) [F12.20] 09/12/2017  . Tobacco use disorder [F17.200] 09/12/2017  . Cocaine use disorder, severe, dependence (HCC) [F14.20] 01/03/2017  . History of encephalitis [Z86.61] 12/10/2016  . Bipolar I disorder, most recent episode depressed, severe without psychotic features (HCC) [F31.4] 12/09/2016  . Allergic reaction [T78.40XA]   . Rash [R21]   . Suicidal ideation [R45.851]   . PTSD (post-traumatic stress disorder) [F43.10] 09/12/2014  . Episodic mood disorder (HCC) [F39] 09/11/2014   Total Time spent with patient: 30 minutes  Past Medical History:  Past Medical History:  Diagnosis Date  . Bell's palsy   . Cannabis abuse   . Cellulitis of right leg 04/2017  . Chronic left shoulder pain   . Depression   . Encephalitis   . Episodic mood disorder (HCC)   . Homelessness   . Post traumatic stress disorder   . Seizures (HCC)    last seizure 12 yrs ago  . Sepsis (HCC) 04/2017    Past Surgical History:  Procedure Laterality Date  . APPENDECTOMY    . brain damage    . CHOLECYSTECTOMY    . FOOT SURGERY Bilateral   . KNEE SURGERY Right    Family History:  Family History  Problem Relation Age of Onset  . Cancer Mother   . Heart attack Father   . Mental illness Sister   . Alcoholism Brother    Social History:  History  Alcohol Use No     History  Drug Use  . Frequency: 2.0 times per week  . Types: Marijuana    Comment: Weekly use + THC    Social History   Social History  . Marital status: Divorced     Spouse name: N/A  . Number of children: N/A  . Years of education: N/A   Social History Main Topics  . Smoking status: Current Every Day Smoker    Packs/day: 1.00    Years: 38.00    Types: Cigarettes  . Smokeless tobacco: Never Used  . Alcohol use No  . Drug use: Yes    Frequency: 2.0 times per week    Types: Marijuana     Comment: Weekly use + THC  . Sexual activity: Yes    Birth control/ protection: None   Other Topics Concern  . None   Social History Narrative  . None   Additional Social History:                         Sleep: Fair  Appetite:  Fair  Current Medications: Current Facility-Administered Medications  Medication Dose Route Frequency Provider Last Rate Last Dose  . acetaminophen (TYLENOL) tablet 650 mg  650 mg Oral Q6H PRN Sophea Rackham B, MD      . albuterol (PROVENTIL HFA;VENTOLIN HFA) 108 (90 Base) MCG/ACT inhaler 2 puff  2 puff Inhalation Q6H PRN Hawa Henly B, MD      . alum & mag hydroxide-simeth (MAALOX/MYLANTA) 200-200-20 MG/5ML suspension 30 mL  30 mL Oral Q4H PRN Cortavius Montesinos B, MD      . azithromycin (ZITHROMAX) tablet 250 mg  250 mg Oral Daily Rayquan Amrhein B, MD      . diclofenac sodium (VOLTAREN) 1 % transdermal gel 4 g  4 g Topical QID Omero Kowal B, MD   4 g at 09/15/17 1720  . fluticasone (FLONASE) 50 MCG/ACT nasal spray 2 spray  2 spray Each Nare Daily Sreshta Cressler B, MD   2 spray at 09/15/17 1718  . gabapentin (NEURONTIN) tablet 300 mg  300 mg Oral TID Kendre Sires B, MD   300 mg at 09/15/17 1718  . hydrOXYzine (ATARAX/VISTARIL) tablet 50 mg  50 mg Oral TID Tobi Groesbeck B, MD   50 mg at 09/15/17 1718  . lithium carbonate (LITHOBID) CR tablet 600 mg  600 mg Oral Q12H Shivon Hackel B, MD      . lithium carbonate capsule 300 mg  300 mg Oral TID WC Ameira Alessandrini B, MD   300 mg at 09/15/17 1718  . loratadine (CLARITIN) tablet 10 mg  10 mg Oral Daily Ellan Tess  B, MD   10 mg at 09/15/17 1326  . magnesium hydroxide (MILK OF MAGNESIA) suspension 30 mL  30 mL Oral Daily PRN Morgann Woodburn B, MD      . nicotine (NICODERM CQ - dosed in mg/24 hours) patch 21 mg  21 mg Transdermal Q0600 Sharnette Kitamura B, MD   21 mg at 09/16/17 0649  . pantoprazole (PROTONIX) EC tablet 40 mg  40 mg Oral Daily Zyen Triggs B, MD   40 mg at 09/15/17 0924  . QUEtiapine (SEROQUEL) tablet 50 mg  50 mg Oral QHS Rosaire Cueto B, MD  50 mg at 09/15/17 2255  . [START ON 09/17/2017] venlafaxine XR (EFFEXOR-XR) 24 hr capsule 225 mg  225 mg Oral Q breakfast Rodney Wigger B, MD        Lab Results:  Results for orders placed or performed during the hospital encounter of 09/12/17 (from the past 48 hour(s))  Lithium level     Status: Abnormal   Collection Time: 09/16/17  6:38 AM  Result Value Ref Range   Lithium Lvl 0.54 (L) 0.60 - 1.20 mmol/L    Blood Alcohol level:  Lab Results  Component Value Date   ETH <10 09/11/2017   ETH <5 04/10/2017    Metabolic Disorder Labs: Lab Results  Component Value Date   HGBA1C 5.4 09/13/2017   MPG 108.28 09/13/2017   MPG 117 12/11/2016   Lab Results  Component Value Date   PROLACTIN 15.5 (H) 12/11/2016   Lab Results  Component Value Date   CHOL 166 09/13/2017   TRIG 46 09/13/2017   HDL 70 09/13/2017   CHOLHDL 2.4 09/13/2017   VLDL 9 09/13/2017   LDLCALC 87 09/13/2017   LDLCALC 69 12/11/2016    Physical Findings: AIMS:  , ,  ,  ,    CIWA:  CIWA-Ar Total: 2 COWS:  COWS Total Score: 0  Musculoskeletal: Strength & Muscle Tone: within normal limits Gait & Station: broad based Patient leans: N/A  Psychiatric Specialty Exam: Physical Exam  Nursing note and vitals reviewed. Psychiatric: His behavior is normal. His mood appears anxious. His speech is rapid and/or pressured. Cognition and memory are normal. He expresses impulsivity. He exhibits a depressed mood. He expresses suicidal ideation. He  expresses suicidal plans.    Review of Systems  Musculoskeletal: Positive for joint pain.  Psychiatric/Behavioral: Positive for depression and suicidal ideas. The patient is nervous/anxious and has insomnia.   All other systems reviewed and are negative.   Blood pressure 115/79, pulse 71, temperature 98.1 F (36.7 C), temperature source Oral, resp. rate 18, height 5\' 9"  (1.753 m), weight 93 kg (205 lb), SpO2 98 %.Body mass index is 30.27 kg/m.  General Appearance: Casual  Eye Contact:  Good  Speech:  Clear and Coherent  Volume:  Normal  Mood:  Anxious and Depressed  Affect:  Full Range  Thought Process:  Goal Directed and Descriptions of Associations: Intact  Orientation:  Full (Time, Place, and Person)   Thought Content:  WDL  Suicidal Thoughts:  Yes.  with intent/plan  Homicidal Thoughts:  No  Memory:  Immediate;   Fair Recent;   Fair Remote;   Fair  Judgement:  Impaired  Insight:  Shallow  Psychomotor Activity:  Normal  Concentration:  Concentration: Fair and Attention Span: Fair  Recall:  [employer] of Knowledge:  Fair  Language:  Fair  Akathisia:  No  Handed:  Right  AIMS (if indicated):     Assets:  Communication Skills Desire for Improvement Resilience Social Support  ADL's:  Intact  Cognition:  WNL  Sleep:  Number of Hours: 6.5     Treatment Plan Summary: Daily contact with patient to assess and evaluate symptoms and progress in treatment and Medication management    Mr. Craver is a 56 year old male with a history of bipolar disorder admitted for suicidal ideation with a plan to overdose in the context of severe social stressors.  # Suicidal ideation - The patient is able to contract for safety in the hospital  # Mood - Increase Effexor to 225 mg daily  -  Continue Seroquel 50 mg nightly - Increase Lithium to 600 mg BID, Li level 0.54   # Insomnia - Resolved with Seroquel  # Anxiety - Give Hydroxyzine 50 mg TID   # Chronic pain - continue  Neurontin 300 mg 3 times daily  # COPD exacerbation - Continue albuterol - Continue Azithromycin 250 mg daily   # smoking - Nicotine patch is available  # metabolic syndrome monitoring - Lipid panel, TSH, hemoglobin A1c are normal - EKG QTc 392  # GERD - Start Protonix  # Disposition - Discharge back to his boarding house on Monday - Follow-up with his primary psychiatrist  Kristine LineaJolanta Magdaline Zollars, MD 09/16/2017, 10:14 AM

## 2017-09-16 NOTE — [facility] Group Notes (Signed)
[facility] LCSW Group Therapy Note  Date/Time: 09/16/17, 0930  Type of Therapy and Topic:  Group Therapy:  Feelings around Relapse and Recovery  Participation Level:  Active   Mood:pleasant  Description of Group:    Patients in this group will discuss emotions they experience before and after a relapse. They will process how experiencing these feelings, or avoidance of experiencing them, relates to having a relapse. Facilitator will guide patients to explore emotions they have related to recovery. Patients will be encouraged to process which emotions are more powerful. They will be guided to discuss the emotional reaction significant others in their lives may have to patients' relapse or recovery. Patients will be assisted in exploring ways to respond to the emotions of others without this contributing to a relapse.  Therapeutic Goals: 1. Patient will identify two or more emotions that lead to relapse for them:  2. Patient will identify two emotions that result when they relapse:  3. Patient will identify two emotions related to recovery:  4. Patient will demonstrate ability to communicate their needs through discussion and/or role plays.   Summary of Patient Progress:Pt reports loneliness as an emotion that leads to relapse.  Pt shared substance use history and made a number of appropriate contributions to group discussion.     Therapeutic Modalities:   Cognitive Behavioral Therapy Solution-Focused Therapy Assertiveness Training Relapse Prevention Therapy  Daleen SquibbGreg Iyla Balzarini, LCSW

## 2017-09-16 NOTE — Progress Notes (Signed)
Affect constricted.  Speech slurred.  Endorses passive SI.  Verbalizes anxiety around his disability check.  Medication and group compliant.  Support offered.  Safety maintained.

## 2017-09-16 NOTE — Social Work (Signed)
CSW called [employer] [facility](SSA) office w patient.  Rep informed patient that he had not completed paperwork for continuing disability review, paperwork went to old address.  PAtient will not receive further payments until he completes this paperwork and it is reviewed.  Process can take 60 - 90 days.  Patient gave SSA his new address at boarding house where his rent is paid through end of October.  CSW will investigate possibility of [facility], however patient understands that his current level of substance abuse may not qualify for admission under Medicaid.  Is willing to return to boarding house, [place] to [facility] [place] assessment for [employer], deal w his disability paperwork.  [place].   Santa GeneraAnne Kendi Defalco, LCSW Lead Clinical Social Worker Phone:  [phone]

## 2017-09-16 NOTE — Plan of Care (Signed)
Problem: Safety: Goal: Ability to remain free from injury will improve Outcome: Progressing Remains safe on the unit   

## 2017-09-16 NOTE — Social Work (Signed)
[facility] transitional housing program declined to accept patient - states patient did not participate or engage in program during previous placement, did not make effective use of treatment offered to him.  Santa GeneraAnne Knowledge Escandon, LCSW Lead Clinical Social Worker Phone:  [phone]

## 2017-09-16 NOTE — [facility] Group Notes (Signed)
[facility] Group Notes:  (Nursing/MHT/Case Management/Adjunct)  Date:  09/16/2017  Time:  10:06 PM  Type of Therapy:  Evening Wrap-up Group  Participation Level:  Active  Participation Quality:  Appropriate and Attentive  Affect:  Appropriate  Cognitive:  Alert and Appropriate  Insight:  Appropriate, Good and Improving  Engagement in Group:  Developing/Improving and Engaged  Modes of Intervention:  Discussion  Summary of Progress/Problems:  Anthony MorrowChelsea Nanta Gustin Zobrist 09/16/2017, 10:06 PM

## 2017-09-16 NOTE — Progress Notes (Signed)
D: Pt is alert and oriented x 4,no confusion noted. Patient denies SI/HI/AVH. Patient's affect is flat but brightens upon approach, mood is pleasant and he is cooperative with treatment plan. Patient appears less anxious and he is interacting with peers and staff appropriately.  A: Pt was offered support and encouragement. Pt was given scheduled medications. Pt was encouraged to attend groups. Q 15 minute checks were done for safety.  R:Pt attends groups and interacts well with peers and staff. Pt is complaint with  medication. Pt receptive to treatment and safety maintained on unit, will continue to monitor.

## 2017-09-16 NOTE — Progress Notes (Signed)
Pleasant on approach, cooperative with treatment, no complaints thus far on shift, he spent most of the  evening in the dayroom watching t.v. and interacting with peers in the dayroom. No behavioral issues to report on shift at this time.

## 2017-09-17 NOTE — Progress Notes (Signed)
[facility] MD Progress Note  09/17/2017 5:49 PM Anthony [name]  MRN:  [id number]  Subjective:  Anthony [name] was a bit strange today.  He was sitting in bed, half dressed. Asked him to dress so we could talk.  He did, then went to the bathroom and flushed the toilet over 7+ times.  When he got back he said he continues to feel depressed and suicidal.  He wants to have a place to stay.    Treatment plan. We increased the Effexor to 225 mg daily, continued Seroquel 50 mg nightly, and increased lithium to 600 mg twice daily as his level was subtherapeutic. The patient desires substance abuse treatment program participation. He was positive for marijuana on admission. His sister believes that he spends his money on cocaine at the beginning of the month.  Social/disposition. The patient still has a place to go in [facility] that is available to him until the end of the month. Most importantly, he needs to go to social security office in person to address problems with his disability check. She will follow up with his regular psychiatrist. The patient is not connected to the [place] even though he is an honorably discharged veteran.   Past psychiatric history. There is a history of bipolar illness with 2 suicide attempts. Also there is a history of substance use with regard participation.  Family psychiatric history. Nonreported.  Principal Problem: Bipolar I disorder, most recent episode depressed, severe without psychotic features (HCC) Diagnosis:   Patient Active Problem List   Diagnosis Date Noted  . COPD (chronic obstructive pulmonary disease) (HCC) [J44.9] 09/13/2017  . GERD (gastroesophageal reflux disease) [K21.9] 09/13/2017  . Cannabis use disorder, moderate, dependence (HCC) [F12.20] 09/12/2017  . Tobacco use disorder [F17.200] 09/12/2017  . Cocaine use disorder, severe, dependence (HCC) [F14.20] 01/03/2017  . History of encephalitis [Z86.61] 12/10/2016  . Bipolar I disorder, most recent episode  depressed, severe without psychotic features (HCC) [F31.4] 12/09/2016  . Allergic reaction [T78.40XA]   . Rash [R21]   . Suicidal ideation [R45.851]   . PTSD (post-traumatic stress disorder) [F43.10] 09/12/2014  . Episodic mood disorder (HCC) [F39] 09/11/2014   Total Time spent with patient: 20 minutes  Past Medical History:  Past Medical History:  Diagnosis Date  . Bell's palsy   . Cannabis abuse   . Cellulitis of right leg 04/2017  . Chronic left shoulder pain   . Depression   . Encephalitis   . Episodic mood disorder (HCC)   . Homelessness   . Post traumatic stress disorder   . Seizures (HCC)    last seizure 12 yrs ago  . Sepsis (HCC) 04/2017    Past Surgical History:  Procedure Laterality Date  . APPENDECTOMY    . brain damage    . CHOLECYSTECTOMY    . FOOT SURGERY Bilateral   . KNEE SURGERY Right    Family History:  Family History  Problem Relation Age of Onset  . Cancer Mother   . Heart attack Father   . Mental illness Sister   . Alcoholism Brother    Social History:  History  Alcohol Use No     History  Drug Use  . Frequency: 2.0 times per week  . Types: Marijuana    Comment: Weekly use + THC    Social History   Social History  . Marital status: Divorced    Spouse name: N/A  . Number of children: N/A  . Years of education: N/A   Social  History Main Topics  . Smoking status: Current Every Day Smoker    Packs/day: 1.00    Years: 38.00    Types: Cigarettes  . Smokeless tobacco: Never Used  . Alcohol use No  . Drug use: Yes    Frequency: 2.0 times per week    Types: Marijuana     Comment: Weekly use + THC  . Sexual activity: Yes    Birth control/ protection: None   Other Topics Concern  . None   Social History Narrative  . None   Additional Social History:                         Sleep: Fair  Appetite:  Fair  Current Medications: Current Facility-Administered Medications  Medication Dose Route Frequency Provider Last  Rate Last Dose  . acetaminophen (TYLENOL) tablet 650 mg  650 mg Oral Q6H PRN Pucilowska, Jolanta B, MD      . albuterol (PROVENTIL HFA;VENTOLIN HFA) 108 (90 Base) MCG/ACT inhaler 2 puff  2 puff Inhalation Q6H PRN Pucilowska, Jolanta B, MD   2 puff at 09/16/17 1735  . alum & mag hydroxide-simeth (MAALOX/MYLANTA) 200-200-20 MG/5ML suspension 30 mL  30 mL Oral Q4H PRN Pucilowska, Jolanta B, MD      . azithromycin (ZITHROMAX) tablet 250 mg  250 mg Oral Daily Pucilowska, Jolanta B, MD   250 mg at 09/17/17 0840  . diclofenac sodium (VOLTAREN) 1 % transdermal gel 4 g  4 g Topical QID Pucilowska, Jolanta B, MD   4 g at 09/17/17 1155  . fluticasone (FLONASE) 50 MCG/ACT nasal spray 2 spray  2 spray Each Nare Daily Pucilowska, Jolanta B, MD   2 spray at 09/17/17 0840  . gabapentin (NEURONTIN) tablet 300 mg  300 mg Oral TID Pucilowska, Jolanta B, MD   300 mg at 09/17/17 1155  . hydrOXYzine (ATARAX/VISTARIL) tablet 50 mg  50 mg Oral TID Pucilowska, Jolanta B, MD   50 mg at 09/17/17 1155  . lithium carbonate (LITHOBID) CR tablet 600 mg  600 mg Oral Q12H Pucilowska, Jolanta B, MD   600 mg at 09/17/17 0840  . lithium carbonate capsule 300 mg  300 mg Oral TID WC Pucilowska, Jolanta B, MD   300 mg at 09/17/17 1155  . loratadine (CLARITIN) tablet 10 mg  10 mg Oral Daily Pucilowska, Jolanta B, MD   10 mg at 09/17/17 0840  . magnesium hydroxide (MILK OF MAGNESIA) suspension 30 mL  30 mL Oral Daily PRN Pucilowska, Jolanta B, MD      . nicotine (NICODERM CQ - dosed in mg/24 hours) patch 21 mg  21 mg Transdermal Q0600 Pucilowska, Jolanta B, MD   21 mg at 09/17/17 0840  . pantoprazole (PROTONIX) EC tablet 40 mg  40 mg Oral Daily Pucilowska, Jolanta B, MD   40 mg at 09/17/17 0840  . QUEtiapine (SEROQUEL) tablet 50 mg  50 mg Oral QHS Pucilowska, Jolanta B, MD   50 mg at 09/16/17 2254  . venlafaxine XR (EFFEXOR-XR) 24 hr capsule 225 mg  225 mg Oral Q breakfast Pucilowska, Jolanta B, MD   225 mg at 09/17/17 0840    Lab  Results:  Results for orders placed or performed during the hospital encounter of 09/12/17 (from the past 48 hour(s))  Lithium level     Status: Abnormal   Collection Time: 09/16/17  6:38 AM  Result Value Ref Range   Lithium Lvl 0.54 (L) 0.60 - 1.20 mmol/L  Blood Alcohol level:  Lab Results  Component Value Date   ETH <10 09/11/2017   ETH <5 04/10/2017    Metabolic Disorder Labs: Lab Results  Component Value Date   HGBA1C 5.4 09/13/2017   MPG 108.28 09/13/2017   MPG 117 12/11/2016   Lab Results  Component Value Date   PROLACTIN 15.5 (H) 12/11/2016   Lab Results  Component Value Date   CHOL 166 09/13/2017   TRIG 46 09/13/2017   HDL 70 09/13/2017   CHOLHDL 2.4 09/13/2017   VLDL 9 09/13/2017   LDLCALC 87 09/13/2017   LDLCALC 69 12/11/2016    Physical Findings: AIMS:  , ,  ,  ,    CIWA:  CIWA-Ar Total: 2 COWS:  COWS Total Score: 0  Musculoskeletal: Strength & Muscle Tone: within normal limits Gait & Station: broad based Patient leans: N/A  Psychiatric Specialty Exam: Physical Exam  Nursing note and vitals reviewed. Psychiatric: His behavior is normal. His mood appears anxious. His speech is rapid and/or pressured. Cognition and memory are normal. He expresses impulsivity. He exhibits a depressed mood. He expresses suicidal ideation. He expresses suicidal plans.    Review of Systems  Musculoskeletal: Positive for joint pain.  Psychiatric/Behavioral: Positive for depression and suicidal ideas. The patient is nervous/anxious and has insomnia.   All other systems reviewed and are negative.   Blood pressure 121/79, pulse 82, temperature 98.1 F (36.7 C), temperature source Oral, resp. rate 18, height 5\' 9"  (1.753 m), weight 93 kg (205 lb), SpO2 100 %.Body mass index is 30.27 kg/m.  General Appearance: Casual  Eye Contact:  Good  Speech:  Pressured  Volume:  Normal  Mood:  Anxious  Affect:  Depressed  Thought Process:  Goal Directed and Descriptions of  Associations: Intact  Orientation:  Full (Time, Place, and Person)   Thought Content:  WDL  Suicidal Thoughts:  Yes.  with intent/plan  Homicidal Thoughts:  No  Memory:  Immediate;   Fair Recent;   Fair Remote;   Fair  Judgement:  Impaired  Insight:  Shallow  Psychomotor Activity:  Normal  Concentration:  Concentration: Fair and Attention Span: Fair  Recall:  [employer] of Knowledge:  Fair  Language:  Fair  Akathisia:  No  Handed:  Right  AIMS (if indicated):     Assets:  Communication Skills Desire for Improvement Resilience Social Support  ADL's:  Intact  Cognition:  WNL  Sleep:  Number of Hours: 6.45     Treatment Plan Summary: Daily contact with patient to assess and evaluate symptoms and progress in treatment and Medication management    Anthony [name] is a 56 year old male with a history of bipolar disorder admitted for suicidal ideation with a plan to overdose in the context of severe social stressors.  # Suicidal ideation - The patient is able to contract for safety in the hospital   # Mood -continue Effexor to 225 mg daily  - Continue Seroquel 50 mg nightly - continue Lithium to 600 mg BID, Li level 0.54   # Insomnia - Resolved with Seroquel  # Anxiety - continue Hydroxyzine 50 mg TID   # Chronic pain - continue Neurontin 300 mg 3 times daily  # COPD exacerbation - Continue albuterol - Continue Azithromycin 250 mg daily   # smoking - Nicotine patch is available  # metabolic syndrome monitoring - Lipid panel, TSH, hemoglobin A1c are normal - EKG QTc 392  # GERD - continue Protonix  # Disposition -  Discharge back to his boarding house on Monday - Follow-up with his primary psychiatrist  Cindee Lame, MD 09/17/2017, 5:49 PM

## 2017-09-17 NOTE — Progress Notes (Signed)
D : Patient  Was eating dinner . When he finished he got up  from table  Felt unsteady  Backed up to wall and slid down . Patient didn't not get hurt in any manner . Md called  Got only voice mail. Patient has no family to call . Supervisor notified for hospital  Kim .  A: no injuries noted by patient and staff R: Patient moved to room 309 elavated to high fall. Continue  Education on safety

## 2017-09-17 NOTE — Progress Notes (Signed)
Patient is up on the unit. He is disheveled and messy. He is alert and oriented. At times he is child like in behavior wanting reassurance. He is med compliant denies pain. Denies SI, HI, AVH. He states his mood has improved since being here. Will continue to monitor.

## 2017-09-17 NOTE — [facility] Group Notes (Signed)
LCSW Group Therapy Note  09/17/2017 1:00pm  Type of Therapy and Topic:  Group Therapy:  Cognitive Distortions  Participation Level:  Minimal   Description of Group:    Patients in this group will be introduced to the topic of cognitive distortions.  Patients will identify and describe cognitive distortions, describe the feelings these distortions create for them.  Patients will identify one or more situations in their personal life where they have cognitively distorted thinking and will verbalize challenging this cognitive distortion through positive thinking skills.  Patients will practice the skill of using positive affirmations to challenge cognitive distortions using affirmation cards.    Therapeutic Goals:  1. Patient will identify two or more cognitive distortions they have used 2. Patient will identify one or more emotions that stem from use of a cognitive distortion 3. Patient will demonstrate use of a positive affirmation to counter a cognitive distortion through discussion and/or role play. 4. Patient will describe one way cognitive distortions can be detrimental to wellness   Summary of Patient Progress:  Pt able to meet therapeutic goals listed above.  CSW utilized "Unhelpful Thinking Styles" handout.      Therapeutic Modalities:   Cognitive Behavioral Therapy Motivational Interviewing   Anthony [name] Anthony Nanna, LCSW 09/17/2017 3:10 PM

## 2017-09-18 NOTE — Plan of Care (Signed)
Problem: Education: Goal: Knowledge of disease or condition will improve Outcome: Progressing Patient is aware of disease processes and medication regimen  Problem: Activity: Goal: Interest or engagement in leisure activities will improve Outcome: Progressing Patient is taking his medicines and participating in care assessments   Problem: Safety: Goal: Ability to remain free from injury will improve Outcome: Progressing Patient is safe and secure and free from harm to self and others

## 2017-09-18 NOTE — [facility] Group Notes (Signed)
[facility] LCSW Group Therapy 09/18/2017 1:15pm  Type of Therapy: Group Therapy- Feelings Around Discharge & Establishing a Supportive Framework  Participation Level:  Active  Description of Group:   What is a supportive framework? What does it look like feel like and how do I discern it from and unhealthy non-supportive network? Learn how to cope when supports are not helpful and don't support you. Discuss what to do when your family/friends are not supportive.  Summary of Patient Progress Pt was active in discussion, identifying good advice and honesty as markers of a positive support.   Therapeutic Modalities:   Cognitive Behavioral Therapy Person-Centered Therapy Motivational Interviewing   Verdene LennertLauren C Aniqa Hare, LCSW 09/18/2017 2:19 PM

## 2017-09-18 NOTE — Progress Notes (Signed)
[facility] MD Progress Note  09/18/2017 2:34 PM Anthony [name]  MRN:  [id number]  Subjective:  Anthony [name] was laying in bed but sat up when I came in.  He had a fall yesterday.  I asked him about it "it wasn't so much a fall but I just leaned against the wall and slid down. I was tired."  States he feels fine now.  He was moved closer to the nursing station so he could had more supervision. He states he continues to be depressed today.  He is stressed about money.  Denies SI.   Treatment plan. We increased the Effexor to 225 mg daily, continued Seroquel 50 mg nightly, and increased lithium to 600 mg twice daily as his level was subtherapeutic. The patient desires substance abuse treatment program participation. He was positive for marijuana on admission. His sister believes that he spends his money on cocaine at the beginning of the month.  Social/disposition. The patient still has a place to go in [facility] that is available to him until the end of the month. Most importantly, he needs to go to social security office in person to address problems with his disability check. She will follow up with his regular psychiatrist. The patient is not connected to the [place] even though he is an honorably discharged veteran.   Past psychiatric history. There is a history of bipolar illness with 2 suicide attempts. Also there is a history of substance use with regard participation.  Family psychiatric history. Nonreported.  Principal Problem: Bipolar I disorder, most recent episode depressed, severe without psychotic features (HCC) Diagnosis:   Patient Active Problem List   Diagnosis Date Noted  . COPD (chronic obstructive pulmonary disease) (HCC) [J44.9] 09/13/2017  . GERD (gastroesophageal reflux disease) [K21.9] 09/13/2017  . Cannabis use disorder, moderate, dependence (HCC) [F12.20] 09/12/2017  . Tobacco use disorder [F17.200] 09/12/2017  . Cocaine use disorder, severe, dependence (HCC) [F14.20] 01/03/2017   . History of encephalitis [Z86.61] 12/10/2016  . Bipolar I disorder, most recent episode depressed, severe without psychotic features (HCC) [F31.4] 12/09/2016  . Allergic reaction [T78.40XA]   . Rash [R21]   . Suicidal ideation [R45.851]   . PTSD (post-traumatic stress disorder) [F43.10] 09/12/2014  . Episodic mood disorder (HCC) [F39] 09/11/2014   Total Time spent with patient: 20 minutes  Past Medical History:  Past Medical History:  Diagnosis Date  . Bell's palsy   . Cannabis abuse   . Cellulitis of right leg 04/2017  . Chronic left shoulder pain   . Depression   . Encephalitis   . Episodic mood disorder (HCC)   . Homelessness   . Post traumatic stress disorder   . Seizures (HCC)    last seizure 12 yrs ago  . Sepsis (HCC) 04/2017    Past Surgical History:  Procedure Laterality Date  . APPENDECTOMY    . brain damage    . CHOLECYSTECTOMY    . FOOT SURGERY Bilateral   . KNEE SURGERY Right    Family History:  Family History  Problem Relation Age of Onset  . Cancer Mother   . Heart attack Father   . Mental illness Sister   . Alcoholism Brother    Social History:  History  Alcohol Use No     History  Drug Use  . Frequency: 2.0 times per week  . Types: Marijuana    Comment: Weekly use + THC    Social History   Social History  . Marital status: Divorced  Spouse name: N/A  . Number of children: N/A  . Years of education: N/A   Social History Main Topics  . Smoking status: Current Every Day Smoker    Packs/day: 1.00    Years: 38.00    Types: Cigarettes  . Smokeless tobacco: Never Used  . Alcohol use No  . Drug use: Yes    Frequency: 2.0 times per week    Types: Marijuana     Comment: Weekly use + THC  . Sexual activity: Yes    Birth control/ protection: None   Other Topics Concern  . None   Social History Narrative  . None   Additional Social History:                         Sleep: Fair  Appetite:  Fair  Current  Medications: Current Facility-Administered Medications  Medication Dose Route Frequency Provider Last Rate Last Dose  . acetaminophen (TYLENOL) tablet 650 mg  650 mg Oral Q6H PRN Pucilowska, Jolanta B, MD      . albuterol (PROVENTIL HFA;VENTOLIN HFA) 108 (90 Base) MCG/ACT inhaler 2 puff  2 puff Inhalation Q6H PRN Pucilowska, Jolanta B, MD   2 puff at 09/18/17 0840  . alum & mag hydroxide-simeth (MAALOX/MYLANTA) 200-200-20 MG/5ML suspension 30 mL  30 mL Oral Q4H PRN Pucilowska, Jolanta B, MD      . azithromycin (ZITHROMAX) tablet 250 mg  250 mg Oral Daily Pucilowska, Jolanta B, MD   250 mg at 09/18/17 0834  . diclofenac sodium (VOLTAREN) 1 % transdermal gel 4 g  4 g Topical QID Pucilowska, Jolanta B, MD   4 g at 09/18/17 0839  . fluticasone (FLONASE) 50 MCG/ACT nasal spray 2 spray  2 spray Each Nare Daily Pucilowska, Jolanta B, MD   2 spray at 09/18/17 0840  . gabapentin (NEURONTIN) tablet 300 mg  300 mg Oral TID Pucilowska, Jolanta B, MD   300 mg at 09/18/17 1217  . hydrOXYzine (ATARAX/VISTARIL) tablet 50 mg  50 mg Oral TID Pucilowska, Jolanta B, MD   50 mg at 09/18/17 1217  . lithium carbonate (LITHOBID) CR tablet 600 mg  600 mg Oral Q12H Pucilowska, Jolanta B, MD   600 mg at 09/18/17 0833  . lithium carbonate capsule 300 mg  300 mg Oral TID WC Pucilowska, Jolanta B, MD   300 mg at 09/18/17 1217  . loratadine (CLARITIN) tablet 10 mg  10 mg Oral Daily Pucilowska, Jolanta B, MD   10 mg at 09/18/17 0834  . magnesium hydroxide (MILK OF MAGNESIA) suspension 30 mL  30 mL Oral Daily PRN Pucilowska, Jolanta B, MD      . nicotine (NICODERM CQ - dosed in mg/24 hours) patch 21 mg  21 mg Transdermal Q0600 Pucilowska, Jolanta B, MD   21 mg at 09/18/17 0644  . pantoprazole (PROTONIX) EC tablet 40 mg  40 mg Oral Daily Pucilowska, Jolanta B, MD   40 mg at 09/18/17 0834  . QUEtiapine (SEROQUEL) tablet 50 mg  50 mg Oral QHS Pucilowska, Jolanta B, MD   50 mg at 09/17/17 2148  . venlafaxine XR (EFFEXOR-XR) 24 hr  capsule 225 mg  225 mg Oral Q breakfast Pucilowska, Jolanta B, MD   225 mg at 09/18/17 1610    Lab Results:  No results found for this or any previous visit (from the past 48 hour(s)).  Blood Alcohol level:  Lab Results  Component Value Date   ETH <10 09/11/2017  ETH <5 04/10/2017    Metabolic Disorder Labs: Lab Results  Component Value Date   HGBA1C 5.4 09/13/2017   MPG 108.28 09/13/2017   MPG 117 12/11/2016   Lab Results  Component Value Date   PROLACTIN 15.5 (H) 12/11/2016   Lab Results  Component Value Date   CHOL 166 09/13/2017   TRIG 46 09/13/2017   HDL 70 09/13/2017   CHOLHDL 2.4 09/13/2017   VLDL 9 09/13/2017   LDLCALC 87 09/13/2017   LDLCALC 69 12/11/2016    Physical Findings: AIMS:  , ,  ,  ,    CIWA:  CIWA-Ar Total: 2 COWS:  COWS Total Score: 0  Musculoskeletal: Strength & Muscle Tone: within normal limits Gait & Station: broad based Patient leans: N/A  Psychiatric Specialty Exam: Physical Exam  Nursing note and vitals reviewed. Psychiatric: His speech is normal and behavior is normal. Cognition and memory are normal. He expresses impulsivity. He exhibits a depressed mood.    Review of Systems  Musculoskeletal: Positive for joint pain.  Psychiatric/Behavioral: Positive for depression and suicidal ideas. The patient is nervous/anxious and has insomnia.   All other systems reviewed and are negative.   Blood pressure 131/90, pulse 75, temperature 98.2 F (36.8 C), temperature source Oral, resp. rate 16, height 5\' 9"  (1.753 m), weight 93 kg (205 lb), SpO2 99 %.Body mass index is 30.27 kg/m.  General Appearance: Disheveled  Eye Contact:  Good  Speech:  Normal Rate  Volume:  Normal  Mood:  Depressed  Affect:  Depressed  Thought Process:  Goal Directed and Descriptions of Associations: Intact  Orientation:  Full (Time, Place, and Person)   Thought Content:  WDL  Suicidal Thoughts:  Yes.  with intent/plan present on admission, denies today    Homicidal Thoughts:  No  Memory:  Immediate;   Fair Recent;   Fair Remote;   Fair  Judgement:  Impaired  Insight:  Shallow  Psychomotor Activity:  Normal  Concentration:  Concentration: Fair and Attention Span: Fair  Recall:  [employer]  Fund of Knowledge:  Fair  Language:  Fair  Akathisia:  No  Handed:  Right  AIMS (if indicated):     Assets:  Communication Skills Desire for Improvement Resilience Social Support  ADL's:  Intact  Cognition:  WNL  Sleep:  Number of Hours: 6.45     Treatment Plan Summary: Daily contact with patient to assess and evaluate symptoms and progress in treatment and Medication management    Anthony [name] is a 56 year old male with a history of bipolar disorder admitted for suicidal ideation with a plan to overdose in the context of severe social stressors.  # Suicidal ideation - The patient is able to contract for safety in the hospital   # Mood -continue Effexor to 225 mg daily  - Continue Seroquel 50 mg nightly - continue Lithium to 600 mg BID, Li level 0.54   # Insomnia - Resolved with Seroquel  # Anxiety - continue Hydroxyzine 50 mg TID   # Chronic pain - continue Neurontin 300 mg 3 times daily  # COPD exacerbation - Continue albuterol - Continue Azithromycin 250 mg daily   # smoking - Nicotine patch is available  # metabolic syndrome monitoring - Lipid panel, TSH, hemoglobin A1c are normal - EKG QTc 392  # GERD - continue Protonix  # Disposition - Discharge back to his boarding house on Monday - Follow-up with his primary psychiatrist  Cindee LameLauren M Alinda Egolf, MD 09/18/2017, 2:34 PM

## 2017-09-18 NOTE — [facility] Group Notes (Signed)
[facility] Group Notes:  (Nursing/MHT/Case Management/Adjunct)  Date:  09/18/2017  Time:  7:05 AM  Type of Therapy:  Psychoeducational Skills  Participation Level:  Active  Participation Quality:  Appropriate, Attentive and Sharing  Affect:  Depressed  Cognitive:  Appropriate  Insight:  Limited  Engagement in Group:  Engaged  Modes of Intervention:  Discussion, Socialization and Support  Summary of Progress/Problems: Patient expressed during group that he was depressed, and felt suicidal. When the patient is encouraged to think about something that brings him joy, he states that he doesn't have anything, but golf and can not participate in that hobby due finaicial issues. Reported to patient's RN, to assist with support and encouragement.  Chancy MilroyLaquanda Y Agustina Witzke 09/18/2017, 7:05 AM

## 2017-09-18 NOTE — Progress Notes (Signed)
Received Anthony [name] this am after breakfast, he was compliant with his medications. He completed his self inventory form and continues to feel depressed, anxiety, and hopeless with passive thoughts of SI. He has attended all of the morning therapy sessions. He continued to be visual in the milieu at intervals. He was compliant with his 1800 medications then announced to the writer if he is discharge tomorrow he will hang hisself.

## 2017-09-18 NOTE — Progress Notes (Signed)
No further falls, pt was monitored every 15 minutes and close proximity to the nurses station at this time

## 2017-09-18 NOTE — Plan of Care (Signed)
Problem: Activity: Goal: Interest or engagement in leisure activities will improve Outcome: Progressing [name] has attended all of the morning group sessions.  Problem: Safety: Goal: Ability to remain free from injury will improve Outcome: Progressing Trase's gait remains unsteady, but he is being carefully while ambulating.

## 2017-09-19 LAB — LITHIUM LEVEL: LITHIUM LVL: 1.15 mmol/L (ref 0.60–1.20)

## 2017-09-19 NOTE — Plan of Care (Signed)
Problem: Coping: Goal: Ability to demonstrate self-control will improve Outcome: Progressing Patient is taking his medicines and participating in activities with peers  Problem: Safety: Goal: Periods of time without injury will increase Outcome: Progressing Patient has not fallen this shift, monitored every 15 minutes and close to the nurses station

## 2017-09-19 NOTE — Progress Notes (Signed)
D: Pt A & O X3. Denies SI, HI, AVH and pain at this time. Verbally contracts for safety. Expressed anxiety related to d/c "I'm ready to go but I'm anxious, I need some more help at home, taking care of myself financially and with my disability too". Reports he's eating and drinking well. Pt was picked up and dropped off to the bus stop by facility's curtesy vehicle. A: Scheduled medications administered as prescribed with verbal education and effects monitored. D/C instructions reviewed with pt including prescriptions, medication samples and follow up appointment. Emotional support offered to pt and compliance with d/c instructions encouraged. All belongings in locker 22 returned to pt prior to d/c. Fall precaution monitored without incident. Routine safety checks maintained till time of departure without self harm gestures or outburst to note. R: Pt receptive to care. Compliant with medications when offered. Denies adverse drug reactions or concerns at this time. Verbalized understanding related to d/c instructions. Ambulatory with shuffling / unsteady gait as per his norm. Appears to be in no physical distress at time of departure from facility. Safety maintained.

## 2017-09-19 NOTE — Discharge Summary (Signed)
Physician Discharge Summary Note  Patient:  Anthony [name] is an 56 y.o., male MRN:  [id number] DOB:  1961-10-19 Patient phone:  [phone] (home)  Patient address:   [address] [place] [address],  Total Time spent with patient: 30 minutes  Date of Admission:  09/12/2017 Date of Discharge: 09/19/2017  Reason for Admission:  Suicidal ideation.  Identifying data. Anthony [name] is a 56 year old male with history of bipolar illness.  Chief complaint. "I've been thinking about killing myself for a week."  History of present illness. Information was obtained from the chart. The patient was brought to the emergency room initially for chest pain. He was walking around furniture in [facility] and started experiencing acute pain.  Now believes that this was panic attack. In the emergency room he disclosed that he has been feeling suicidal for the past week or so with a plan to overdose on medications. The patient reports that since the beginning of the month he has been increasingly depressed with extremely poor sleep, decreased appetite, anhedonia, feeling of guilt and hopelessness poor energy and concentration, social isolation, crying spells, high anxiety and suicidal ideation. The patient did not receive he Social Security disability check this month and has been worried that he is about to lose his housing. He has been able to survive with help from his church. He denies psychotic symptoms or symptoms suggestive of bipolar mania. She reports heightened anxiety witH panic attacks. He also has a history of PTSD. He denies alcohol use. He has been smoking cannabis regularly to address chronic pain. There is no other drug abuse.  Past psychiatric history. He was diagnosed with bipolar disorder at the age of 25. He has had several psychiatry hospitalizations. He attempted suicide by overdose twice. He has been tried on numerous medications including lithium, Depakote and Tegretol. He remembers  that lithium was helpful. He was also on Geodon that did not help. He sees a psychiatrist every 3 months and has been prescribed a combination Effexor, Seroquel, hydroxyzine, and Neurontin. The patient reports that he suffered encephalitis from a mosquito bite from a mosquito bite that resulted in difficulties walking. The patient has broad-base but otherwise stable gait.   Family psychiatric history. None   Social history. He is an Investment banker, operational. He was in the [employer] for 3 years and 8 months serving in the first [place]. He was discharged honorably but is not VA connected. He has been married twice and is divorced. He has no children or family. He lives in a boarding house. He stopped receiving his social security disability check this month for reasons he does not understand.  Principal Problem: Bipolar I disorder, most recent episode depressed, severe without psychotic features [facility]) Discharge Diagnoses: Patient Active Problem List   Diagnosis Date Noted  . COPD (chronic obstructive pulmonary disease) (HCC) [J44.9] 09/13/2017  . GERD (gastroesophageal reflux disease) [K21.9] 09/13/2017  . Cannabis use disorder, moderate, dependence (HCC) [F12.20] 09/12/2017  . Tobacco use disorder [F17.200] 09/12/2017  . Cocaine use disorder, severe, dependence (HCC) [F14.20] 01/03/2017  . History of encephalitis [Z86.61] 12/10/2016  . Bipolar I disorder, most recent episode depressed, severe without psychotic features (HCC) [F31.4] 12/09/2016  . Allergic reaction [T78.40XA]   . Rash [R21]   . Suicidal ideation [R45.851]   . PTSD (post-traumatic stress disorder) [F43.10] 09/12/2014  . Episodic mood disorder (HCC) [F39] 09/11/2014    Past Medical History:  Past Medical History:  Diagnosis Date  . Bell's palsy   .  Cannabis abuse   . Cellulitis of right leg 04/2017  . Chronic left shoulder pain   . Depression   . Encephalitis   . Episodic mood disorder (HCC)   . Homelessness   . Post traumatic  stress disorder   . Seizures (HCC)    last seizure 12 yrs ago  . Sepsis (HCC) 04/2017    Past Surgical History:  Procedure Laterality Date  . APPENDECTOMY    . brain damage    . CHOLECYSTECTOMY    . FOOT SURGERY Bilateral   . KNEE SURGERY Right    Family History:  Family History  Problem Relation Age of Onset  . Cancer Mother   . Heart attack Father   . Mental illness Sister   . Alcoholism Brother    Social History:  History  Alcohol Use No     History  Drug Use  . Frequency: 2.0 times per week  . Types: Marijuana    Comment: Weekly use + THC    Social History   Social History  . Marital status: Divorced    Spouse name: N/A  . Number of children: N/A  . Years of education: N/A   Social History Main Topics  . Smoking status: Current Every Day Smoker    Packs/day: 1.00    Years: 38.00    Types: Cigarettes  . Smokeless tobacco: Never Used  . Alcohol use No  . Drug use: Yes    Frequency: 2.0 times per week    Types: Marijuana     Comment: Weekly use + THC  . Sexual activity: Yes    Birth control/ protection: None   Other Topics Concern  . None   Social History Narrative  . None    Hospital Course:    Anthony [name] is a 56 year old male with a history of bipolar disorder admitted for suicidal ideation with a plan to overdose in the context of severe social stressors.  # Suicidal ideation - Resolved. The patient is able to contract for safety. He is forward thinking and more optimistic about the future.  # Mood - Continue Effexor to 225 mg daily  - Continue Seroquel 50 mg nightly - continue Lithium to 600 mg BID, Li level is therapeutic   # Insomnia - Resolved with Seroquel  # Anxiety - continue Hydroxyzine 50 mg TID   # Chronic pain - continue Neurontin 300 mg 3 times daily  # COPD exacerbation - Continue albuterol - Completed course of Azithromycin  # smoking - Nicotine patch is available  # metabolic syndrome monitoring -  Lipid panel, TSH, hemoglobin A1c are normal - EKG QTc 392  # GERD - continue Protonix  # Social - the patient must go in person to [facility] office to verify his address in order to receive his check.  # Disposition - He is discharged to his boarding house - Follow-up with his primary psychiatrist  Physical Findings: AIMS: Facial and Oral Movements Muscles of Facial Expression: None, normal Lips and Perioral Area: None, normal Jaw: None, normal Tongue: None, normal,Extremity Movements Upper (arms, wrists, hands, fingers): None, normal Lower (legs, knees, ankles, toes): None, normal, Trunk Movements Neck, shoulders, hips: None, normal, Overall Severity Severity of abnormal movements (highest score from questions above): None, normal Incapacitation due to abnormal movements: None, normal Patient's awareness of abnormal movements (rate only patient's report): No Awareness, Dental Status Current problems with teeth and/or dentures?: No Does patient usually wear dentures?: No  CIWA:  CIWA-Ar Total: 2  COWS:  COWS Total Score: 0  Musculoskeletal: Strength & Muscle Tone: within normal limits Gait & Station: broad based Patient leans: N/A  Psychiatric Specialty Exam: Physical Exam  Nursing note and vitals reviewed. Psychiatric: He has a normal mood and affect. His speech is normal and behavior is normal. Thought content normal. Cognition and memory are normal. He expresses impulsivity.    Review of Systems  Musculoskeletal: Positive for falls.  Neurological: Negative.   Psychiatric/Behavioral: Positive for substance abuse.  All other systems reviewed and are negative.   Blood pressure 120/88, pulse 65, temperature 97.8 F (36.6 C), temperature source Oral, resp. rate 20, height 5\' 9"  (1.753 m), weight 93 kg (205 lb), SpO2 99 %.Body mass index is 30.27 kg/m.  General Appearance: Casual  Eye Contact:  Good  Speech:  Clear and Coherent  Volume:  Normal  Mood:  Anxious  Affect:   Appropriate  Thought Process:  Goal Directed and Descriptions of Associations: Intact  Orientation:  Full (Time, Place, and Person)  Thought Content:  WDL  Suicidal Thoughts:  No  Homicidal Thoughts:  No  Memory:  Immediate;   Fair Recent;   Fair Remote;   Fair  Judgement:  Impaired  Insight:  Shallow  Psychomotor Activity:  Normal  Concentration:  Concentration: Fair and Attention Span: Fair  Recall:  [employer]is[employer]Fair  Fund of Knowledge:  Fair  Language:  Fair  Akathisia:  No  Handed:  Right  AIMS (if indicated):     Assets:  Communication Skills Desire for Improvement Housing Resilience Social Support  ADL's:  Intact  Cognition:  WNL  Sleep:  Number of Hours: 5.35     Have you used any form of tobacco in the last 30 days? (Cigarettes, Smokeless Tobacco, Cigars, and/or Pipes): Yes  Has this patient used any form of tobacco in the last 30 days? (Cigarettes, Smokeless Tobacco, Cigars, and/or Pipes) Yes, Yes, A prescription for an FDA-approved tobacco cessation medication was offered at discharge and the patient refused  Blood Alcohol level:  Lab Results  Component Value Date   [facility] Center[facility] <10 09/11/2017   ETH <5 04/10/2017    Metabolic Disorder Labs:  Lab Results  Component Value Date   HGBA1C 5.4 09/13/2017   MPG 108.28 09/13/2017   MPG 117 12/11/2016   Lab Results  Component Value Date   PROLACTIN 15.5 (H) 12/11/2016   Lab Results  Component Value Date   CHOL 166 09/13/2017   TRIG 46 09/13/2017   HDL 70 09/13/2017   CHOLHDL 2.4 09/13/2017   VLDL 9 09/13/2017   LDLCALC 87 09/13/2017   LDLCALC 69 12/11/2016    See Psychiatric Specialty Exam and Suicide Risk Assessment completed by Attending Physician prior to discharge.  Discharge destination:  Home  Is patient on multiple antipsychotic therapies at discharge:  No   Has Patient had three or more failed trials of antipsychotic monotherapy by history:  No  Recommended Plan for Multiple Antipsychotic  Therapies: NA  Discharge Instructions    Diet - low sodium heart healthy    Complete by:  As directed    Increase activity slowly    Complete by:  As directed      Allergies as of 09/19/2017      Reactions   Nuedexta [dextromethorphan-quinidine] Other (See Comments)   hallucinations      Medication List    STOP taking these medications   fluticasone furoate-vilanterol 200-25 MCG/INH Aepb Commonly known as:  BREO ELLIPTA   gabapentin 100 MG  capsule Commonly known as:  NEURONTIN Replaced by:  gabapentin 600 MG tablet   gabapentin 300 MG capsule Commonly known as:  NEURONTIN   predniSONE 20 MG tablet Commonly known as:  DELTASONE     TAKE these medications     Indication  albuterol 108 (90 Base) MCG/ACT inhaler Commonly known as:  PROVENTIL HFA;VENTOLIN HFA Inhale 1 puff into the lungs every 6 (six) hours as needed for wheezing or shortness of breath.  Indication:  Chronic Obstructive Lung Disease   azithromycin 250 MG tablet Commonly known as:  ZITHROMAX Take 1 tab daily for 4 days What changed:  how much to take  how to take this  when to take this  additional instructions  Indication:  Bacterial Infection of Airway Causing COPD Exacerbation   fluticasone 50 MCG/ACT nasal spray Commonly known as:  FLONASE Place 1 spray into both nostrils daily.  Indication:  Allergic Rhinitis   gabapentin 600 MG tablet Commonly known as:  NEURONTIN Take 0.5 tablets (300 mg total) by mouth 3 (three) times daily. Replaces:  gabapentin 100 MG capsule  Indication:  Neuropathic Pain   hydrOXYzine 50 MG tablet Commonly known as:  ATARAX/VISTARIL Take 1 tablet (50 mg total) by mouth 3 (three) times daily. What changed:  when to take this  reasons to take this  Indication:  Feeling Anxious   lithium carbonate 300 MG CR tablet Commonly known as:  LITHOBID Take 2 tablets (600 mg total) by mouth every 12 (twelve) hours.  Indication:  Manic-Depression   loratadine 10  MG tablet Commonly known as:  CLARITIN Take 1 tablet (10 mg total) by mouth daily.  Indication:  Hayfever   pantoprazole 40 MG tablet Commonly known as:  PROTONIX Take 1 tablet (40 mg total) by mouth daily.  Indication:  Gastroesophageal Reflux Disease   QUEtiapine 50 MG tablet Commonly known as:  SEROQUEL Take 1 tablet (50 mg total) by mouth at bedtime.  Indication:  Trouble Sleeping   venlafaxine XR 75 MG 24 hr capsule Commonly known as:  EFFEXOR-XR Take 3 capsules (225 mg total) by mouth daily with breakfast.  Indication:  Generalized Anxiety Disorder, Major Depressive Disorder      Follow-up Information    Malvin Johns, MD Follow up on 11/09/2017.   Specialty:  Psychiatry Why:  Medications management w Eber Jones NP on Dec 12 at 9 AM.  Please call to cancel/reschedule if needed.  Contact information: [address] [facility] [place] [address] [phone]        [facility]esta Mixer. Go on 09/21/2017.   Specialty:  Behavioral Health Why:  Walk in for Hospital Follow up Mon-Friday between 8am-3pm.  Contact information: [address] [place] [address] [phone]           Follow-up recommendations:  Activity:  as tolerated. Diet:  low sodium heart healthy. Other:  keep follow up appointments.  Comments:    Signed: Kristine Linea, MD 09/19/2017, 11:47 AM

## 2017-09-19 NOTE — [facility] Group Notes (Signed)
[facility] Group Notes:  (Nursing/MHT/Case Management/Adjunct)  Date:  09/19/2017  Time:  12:18 AM  Type of Therapy:  Psychoeducational Skills  Participation Level:  Active  Participation Quality:  Appropriate and Attentive  Affect:  Appropriate  Cognitive:  Appropriate  Insight:  Appropriate and Good  Engagement in Group:  Engaged  Modes of Intervention:  Discussion, Socialization and Support  Summary of Progress/Problems:  Anthony [name]  Anthony [name] 09/19/2017, 12:18 AM

## 2017-09-19 NOTE — [facility] Group Notes (Signed)
[facility] LCSW Group Therapy Note  Date/Time Oct 22/18 from 9:30-10:15 Type of Therapy/Topic: Group Therapy: Overcoming Obstacles Participation Level: Patient was invited to attend but did not participate   Daisia Slomski LCSW 

## 2017-09-19 NOTE — [facility] Suicide Risk Assessment (Signed)
[facility]   Principal Problem: Bipolar I disorder, most recent episode depressed, severe without psychotic features [facility](HCC) Discharge Diagnoses:  Patient Active Problem List   Diagnosis Date Noted  . COPD (chronic obstructive pulmonary disease) (HCC) [J44.9] 09/13/2017  . GERD (gastroesophageal reflux disease) [K21.9] 09/13/2017  . Cannabis use disorder, moderate, dependence (HCC) [F12.20] 09/12/2017  . Tobacco use disorder [F17.200] 09/12/2017  . Cocaine use disorder, severe, dependence (HCC) [F14.20] 01/03/2017  . History of encephalitis [Z86.61] 12/10/2016  . Bipolar I disorder, most recent episode depressed, severe without psychotic features (HCC) [F31.4] 12/09/2016  . Allergic reaction [T78.40XA]   . Rash [R21]   . Suicidal ideation [R45.851]   . PTSD (post-traumatic stress disorder) [F43.10] 09/12/2014  . Episodic mood disorder (HCC) [F39] 09/11/2014    Total Time spent with patient: 30 minutes  Musculoskeletal: Strength & Muscle Tone: within normal limits Gait & Station: normal Patient leans: N/A  Psychiatric Specialty Exam: Review of Systems  Psychiatric/Behavioral: Positive for substance abuse.  All other systems reviewed and are negative.   Blood pressure 120/88, pulse 65, temperature 97.8 F (36.6 C), temperature source Oral, resp. rate 20, height 5\' 9"  (1.753 m), weight 93 kg (205 lb), SpO2 99 %.Body mass index is 30.27 kg/m.  General Appearance: Casual  Eye Contact::  Good  Speech:  Clear and Coherent409  Volume:  Normal  Mood:  Anxious  Affect:  Appropriate  Thought Process:  Goal Directed and Descriptions of Associations: Intact  Orientation:  Full (Time, Place, and Person)  Thought Content:  WDL  Suicidal Thoughts:  No  Homicidal Thoughts:  No  Memory:  Immediate;   Fair Recent;   Fair Remote;   Fair  Judgement:  Impaired  Insight:  Shallow  Psychomotor Activity:  Normal  Concentration:  Fair  Recall:  FiservFair  Fund of  Knowledge:Fair  Language: Fair  Akathisia:  No  Handed:  Right  AIMS (if indicated):     Assets:  Communication Skills Desire for Improvement Financial Resources/Insurance Resilience Social Support  Sleep:  Number of Hours: 5.35  Cognition: WNL  ADL's:  Intact   Mental Status Per Nursing Assessment::   On Admission:     Demographic Factors:  Male, Caucasian, Low socioeconomic status and Living alone  Loss Factors: Decline in physical health and Financial problems/change in socioeconomic status  Historical Factors: Prior suicide attempts, Family history of mental illness or substance abuse and Impulsivity  Risk Reduction Factors:   Sense of responsibility to family and Positive social support  Continued Clinical Symptoms:  Bipolar Disorder:   Depressive phase Depression:   Impulsivity Alcohol/Substance Abuse/Dependencies  Cognitive Features That Contribute To Risk:  None    Suicide Risk:  Minimal: No identifiable suicidal ideation.  Patients presenting with no risk factors but with morbid ruminations; may be classified as minimal risk based on the severity of the depressive symptoms  Follow-up Information    Malvin JohnsFarah, Brian, MD Follow up on 11/09/2017.   Specialty:  Psychiatry Why:  Medications management w Eber Jonesarolyn NP on Dec 12 at 9 AM.  Please call to cancel/reschedule if needed.  Contact information: [address] [facility] [place] [address] [phone]        [facility] Follow up.   Why:  Referral made to this program on 10/19 Contact information: [address] [place] [address] [phone]           Plan Of Care/Follow-up recommendations:  Activity:  as tolerated. Diet:  low sodium heart healthy. Other:  keep follow up appointments.    Kristine Linea, MD 09/19/2017, 9:21 AM

## 2017-09-19 NOTE — Progress Notes (Signed)
  [facility] :  Will you be returning to the same living situation after discharge:  Yes,    At discharge, do you have transportation home?: Yes,  will provide PART Bus fare Do you have the ability to pay for your medications: Yes,  insured and 2 weeks supply of medications have been given  Release of information consent forms completed and in the chart;  Patient's signature needed at discharge.  Patient to Follow up at: Follow-up Information    Malvin JohnsFarah, Brian, MD Follow up on 11/09/2017.   Specialty:  Psychiatry Why:  Medications management w Eber Jonesarolyn NP on Dec 12 at 9 AM.  Please call to cancel/reschedule if needed.  Contact information: [address] [facility] [place] [address] [phone]        [facility] Go on 09/21/2017.   Specialty:  Behavioral Health Why:  Walk in for Hospital Follow up Mon-Friday between 8am-3pm.  Contact information: [address] [place] [address] [phone]           Next level of care provider [facility] to [facility] Link:no  Safety Planning and Suicide Prevention discussed: Yes,     Have you used any form of tobacco in the last 30 days? (Cigarettes, Smokeless Tobacco, Cigars, and/or Pipes): Yes  Has patient been referred to the Quitline?: Patient refused referral  Patient has been referred for addiction treatment: Yes  Glennon MacSara P Jamya Starry, LCSW 09/19/2017, 2:11 PM

## 2017-10-05 ENCOUNTER — Encounter (HOSPITAL_COMMUNITY): Payer: Self-pay

## 2017-10-05 DIAGNOSIS — R45851 Suicidal ideations: Secondary | ICD-10-CM | POA: Insufficient documentation

## 2017-10-05 DIAGNOSIS — R2241 Localized swelling, mass and lump, right lower limb: Secondary | ICD-10-CM | POA: Diagnosis present

## 2017-10-05 DIAGNOSIS — J449 Chronic obstructive pulmonary disease, unspecified: Secondary | ICD-10-CM | POA: Diagnosis not present

## 2017-10-05 DIAGNOSIS — F17210 Nicotine dependence, cigarettes, uncomplicated: Secondary | ICD-10-CM | POA: Diagnosis not present

## 2017-10-05 DIAGNOSIS — Z79899 Other long term (current) drug therapy: Secondary | ICD-10-CM | POA: Diagnosis not present

## 2017-10-05 DIAGNOSIS — R4587 Impulsiveness: Secondary | ICD-10-CM | POA: Diagnosis not present

## 2017-10-05 DIAGNOSIS — F332 Major depressive disorder, recurrent severe without psychotic features: Secondary | ICD-10-CM | POA: Diagnosis not present

## 2017-10-05 NOTE — ED Triage Notes (Addendum)
[facility] swelling x3 days. He reports that it is tender upon palpation. Warm to touch. No hx of CHF. A&Ox4. Ambulatory.

## 2017-10-06 ENCOUNTER — Emergency Department (HOSPITAL_COMMUNITY): Payer: Medicare HMO

## 2017-10-06 ENCOUNTER — Emergency Department (HOSPITAL_COMMUNITY)
Admission: EM | Admit: 2017-10-06 | Discharge: 2017-10-06 | Disposition: A | Discharge status: Home / Self Care | Payer: Medicare HMO | Attending: Emergency Medicine | Admitting: Emergency Medicine

## 2017-10-06 DIAGNOSIS — F332 Major depressive disorder, recurrent severe without psychotic features: Secondary | ICD-10-CM | POA: Diagnosis not present

## 2017-10-06 DIAGNOSIS — R4587 Impulsiveness: Secondary | ICD-10-CM | POA: Diagnosis not present

## 2017-10-06 DIAGNOSIS — R45851 Suicidal ideations: Secondary | ICD-10-CM

## 2017-10-06 DIAGNOSIS — M7989 Other specified soft tissue disorders: Secondary | ICD-10-CM

## 2017-10-06 DIAGNOSIS — R2241 Localized swelling, mass and lump, right lower limb: Secondary | ICD-10-CM | POA: Diagnosis not present

## 2017-10-06 LAB — CBC WITH DIFFERENTIAL/PLATELET
BASOS PCT: 0 %
Basophils Absolute: 0 10*3/uL (ref 0.0–0.1)
EOS ABS: 0.1 10*3/uL (ref 0.0–0.7)
EOS PCT: 1 %
HCT: 40.2 % (ref 39.0–52.0)
HEMOGLOBIN: 13.5 g/dL (ref 13.0–17.0)
LYMPHS ABS: 3.7 10*3/uL (ref 0.7–4.0)
Lymphocytes Relative: 38 %
MCH: 29 pg (ref 26.0–34.0)
MCHC: 33.6 g/dL (ref 30.0–36.0)
MCV: 86.5 fL (ref 78.0–100.0)
MONO ABS: 0.6 10*3/uL (ref 0.1–1.0)
MONOS PCT: 6 %
NEUTROS PCT: 55 %
Neutro Abs: 5.3 10*3/uL (ref 1.7–7.7)
PLATELETS: 293 10*3/uL (ref 150–400)
RBC: 4.65 MIL/uL (ref 4.22–5.81)
RDW: 14.5 % (ref 11.5–15.5)
WBC: 9.8 10*3/uL (ref 4.0–10.5)

## 2017-10-06 LAB — RAPID URINE DRUG SCREEN, HOSP PERFORMED
Amphetamines: NOT DETECTED
Barbiturates: NOT DETECTED
Benzodiazepines: NOT DETECTED
COCAINE: NOT DETECTED
OPIATES: NOT DETECTED
TETRAHYDROCANNABINOL: NOT DETECTED

## 2017-10-06 LAB — BASIC METABOLIC PANEL
Anion gap: 7 (ref 5–15)
BUN: 16 mg/dL (ref 6–20)
CALCIUM: 8.7 mg/dL — AB (ref 8.9–10.3)
CHLORIDE: 106 mmol/L (ref 101–111)
CO2: 26 mmol/L (ref 22–32)
CREATININE: 1.01 mg/dL (ref 0.61–1.24)
Glucose, Bld: 99 mg/dL (ref 65–99)
Potassium: 3.7 mmol/L (ref 3.5–5.1)
SODIUM: 139 mmol/L (ref 135–145)

## 2017-10-06 LAB — LITHIUM LEVEL: Lithium Lvl: <0.06 mmol/L — ABNORMAL LOW (ref 0.60–1.20)

## 2017-10-06 MED ORDER — GABAPENTIN 100 MG PO CAPS
300.0000 mg | ORAL_CAPSULE | Freq: Three times a day (TID) | ORAL | Status: DC
  Administered 2017-10-06: 300 mg via ORAL
  Filled 2017-10-06: qty 3

## 2017-10-06 MED ORDER — VENLAFAXINE HCL ER 75 MG PO CP24
225.0000 mg | ORAL_CAPSULE | Freq: Every day | ORAL | Status: DC
  Administered 2017-10-06: 225 mg via ORAL
  Filled 2017-10-06: qty 3

## 2017-10-06 MED ORDER — LITHIUM CARBONATE ER 300 MG PO TBCR
600.0000 mg | EXTENDED_RELEASE_TABLET | Freq: Two times a day (BID) | ORAL | Status: DC
Start: 2017-10-06 — End: 2017-10-06
  Administered 2017-10-06: 600 mg via ORAL
  Filled 2017-10-06: qty 2

## 2017-10-06 MED ORDER — PANTOPRAZOLE SODIUM 40 MG PO TBEC
40.0000 mg | DELAYED_RELEASE_TABLET | Freq: Every day | ORAL | Status: DC
  Administered 2017-10-06: 40 mg via ORAL
  Filled 2017-10-06: qty 1

## 2017-10-06 MED ORDER — CLINDAMYCIN HCL 300 MG PO CAPS
300.0000 mg | ORAL_CAPSULE | Freq: Three times a day (TID) | ORAL | Status: DC
  Administered 2017-10-06: 300 mg via ORAL
  Filled 2017-10-06: qty 1

## 2017-10-06 MED ORDER — QUETIAPINE FUMARATE 50 MG PO TABS
50.0000 mg | ORAL_TABLET | Freq: Every day | ORAL | Status: DC
Start: 2017-10-06 — End: 2017-10-06

## 2017-10-06 NOTE — BH Assessment (Signed)
[facility]  Per Juanetta BeetsJacqueline Norman, DO, this pt does not require psychiatric hospitalization at this time.  [facility]  This has been included in pt's discharge instructions.  Pt's nurse has been notified.  Doylene Canninghomas Raiya Stainback, MA Triage Specialist [phone]

## 2017-10-06 NOTE — ED Notes (Signed)
Pt reported suicidal ideations with a plan to "HANG MYSELF".

## 2017-10-06 NOTE — [facility] Suicide Risk Assessment (Signed)
Suicide Risk Assessment  Discharge Assessment   [facility]   Principal Problem: Suicidal ideation Discharge Diagnoses:  Patient Active Problem List   Diagnosis Date Noted  . COPD (chronic obstructive pulmonary disease) (HCC) [J44.9] 09/13/2017  . GERD (gastroesophageal reflux disease) [K21.9] 09/13/2017  . Cannabis use disorder, moderate, dependence (HCC) [F12.20] 09/12/2017  . Tobacco use disorder [F17.200] 09/12/2017  . Cocaine use disorder, severe, dependence (HCC) [F14.20] 01/03/2017  . History of encephalitis [Z86.61] 12/10/2016  . Bipolar I disorder, most recent episode depressed, severe without psychotic features (HCC) [F31.4] 12/09/2016  . Allergic reaction [T78.40XA]   . Rash [R21]   . Suicidal ideation [R45.851]   . PTSD (post-traumatic stress disorder) [F43.10] 09/12/2014  . Episodic mood disorder (HCC) [F39] 09/11/2014   Pt was seen and chart reviewed with treatment team and Dr Sharma CovertNorman. Pt has had 13 emergency room visits and two inpatient admissions in the past 6 months. Pt is chronically suicidal and has a high degree of secondary gain by using the emergency room for attention. Pt is psychiatrically clear for discharge with outpatient resources.   Total Time spent with patient: 45 minutes  Musculoskeletal: Strength & Muscle Tone: within normal limits Gait & Station: normal Patient leans: N/A  Psychiatric Specialty Exam:   Blood pressure 117/77, pulse 86, temperature 98.2 F (36.8 C), temperature source Oral, resp. rate 18, SpO2 97 %.There is no height or weight on file to calculate BMI.  General Appearance: Casual  Eye Contact::  Good  Speech:  Clear and Coherent and Normal Rate409  Volume:  Normal  Mood:  Depressed  Affect:  Congruent and Depressed  Thought Process:  Coherent, Goal Directed and Linear  Orientation:  Full (Time, Place, and Person)  Thought Content:  Illogical  Suicidal Thoughts:  No  Homicidal Thoughts:  No   Memory:  Immediate;   Good Recent;   Good Remote;   Fair  Judgement:  Poor  Insight:  Lacking  Psychomotor Activity:  Normal  Concentration:  Good  Recall:  Good  Fund of Knowledge:Good  Language: Good  Akathisia:  No  Handed:  Right  AIMS (if indicated):     Assets:  ArchitectCommunication Skills Financial Resources/Insurance Housing Resilience Social Support  Sleep:     Cognition: WNL  ADL's:  Intact   Mental Status Per Nursing Assessment::   On Admission:     Demographic Factors:  Male, Low socioeconomic status and Unemployed  Loss Factors: Legal issues and Financial problems/change in socioeconomic status  Historical Factors: Impulsivity  Risk Reduction Factors:   Sense of responsibility to family  Continued Clinical Symptoms:  Depression:   Impulsivity  Cognitive Features That Contribute To Risk:  Closed-mindedness    Suicide Risk:  Minimal: No identifiable suicidal ideation.  Patients presenting with no risk factors but with morbid ruminations; may be classified as minimal risk based on the severity of the depressive symptoms  Follow-up Information    Karle PlumberArvind, Moogali M, MD.   Specialty:  Internal Medicine Why:  For re-check of your leg swelling Contact information: [address] [place] [address] [phone]        Schedule an appointment as soon as possible for a visit  with Maeola Harmanain, Brandon Christopher, MD.   

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
## 2017-10-06 NOTE — ED Provider Notes (Signed)
Patient discharged by psychiatry.  He is reportedly already on clindamycin for cellulitis.  No new medications indicated per review of records.   Shaune PollackIsaacs, Pernie Grosso, MD 10/06/17 [phone]
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
## 2017-10-06 NOTE — Discharge Instructions (Signed)
Continue your clindamycin as previously prescribed.  We recommend follow-up with vascular surgery regarding management for your chronic DVT.  Also, follow-up with your primary care doctor for recheck of your leg swelling.  You may return to the emergency department for new or concerning symptoms.  FOR YOUR BEHAVIORAL HEALTH NEEDS:  You are advised to follow up with [facility].  Contact them at your earliest opportunity to ask about enrolling in their program:       [facility]      [address]      [place], [place] [address]       [phone]

## 2017-10-06 NOTE — ED Provider Notes (Signed)
[facility]-EMERGENCY DEPT Provider Note   CSN: [id number] Arrival date & time: 10/05/17  2148    History   Chief Complaint Chief Complaint  Patient presents with  . Leg Swelling    R  . Suicidal    HPI Anthony [name] is a 56 y.o. male.  56 year old male with a history of Bell's palsy, depression, PTSD, seizures, and DVT presents to the emergency department from sheets complaining of right leg swelling times 3 days.  Patient, however, was seen on 10/02/2017 at [facility] for similar complaints which he stated had been ongoing for 3 days at that time.  Patient noting swelling and redness to his right lower leg.  He states that it feels similar to when he had a blood clot.  He also notes being "scratched by a cat" to his lateral leg.  The patient had a Doppler of his right leg performed at [facility] which showed a chronic DVT.  He was discharged on a course of clindamycin which he states he has been taking as prescribed.  He denies any known fevers.  No purulent drainage.  He notes worsening discomfort to his right leg with ambulation, though he has been able to ambulate without difficulty.    As an aside, patient states that he is also suicidal.  He notes, "I had a noose around my neck before coming here".  He has been seen previously for SI with thoughts of hanging self, last in October.   The history is provided by the patient. No language interpreter was used.    Past Medical History:  Diagnosis Date  . Bell's palsy   . Cannabis abuse   . Cellulitis of right leg 04/2017  . Chronic left shoulder pain   . Depression   . Encephalitis   . Episodic mood disorder (HCC)   . Homelessness   . Post traumatic stress disorder   . Seizures (HCC)    last seizure 12 yrs ago  . Sepsis (HCC) 04/2017    Patient Active Problem List   Diagnosis Date Noted  . COPD (chronic obstructive pulmonary disease) (HCC) 09/13/2017  . GERD  (gastroesophageal reflux disease) 09/13/2017  . Cannabis use disorder, moderate, dependence (HCC) 09/12/2017  . Tobacco use disorder 09/12/2017  . Cocaine use disorder, severe, dependence (HCC) 01/03/2017  . History of encephalitis 12/10/2016  . Bipolar I disorder, most recent episode depressed, severe without psychotic features (HCC) 12/09/2016  . Allergic reaction   . Rash   . Suicidal ideation   . PTSD (post-traumatic stress disorder) 09/12/2014  . Episodic mood disorder (HCC) 09/11/2014    Past Surgical History:  Procedure Laterality Date  . APPENDECTOMY    . brain damage    . CHOLECYSTECTOMY    . FOOT SURGERY Bilateral   . KNEE SURGERY Right        Home Medications    Prior to Admission medications   Medication Sig Start Date End Date Taking? Authorizing Provider  albuterol (PROVENTIL HFA;VENTOLIN HFA) 108 (90 Base) MCG/ACT inhaler Inhale 1 puff into the lungs every 6 (six) hours as needed for wheezing or shortness of breath. 09/16/17  Yes Pucilowska, Jolanta B, MD  fluticasone (FLONASE) 50 MCG/ACT nasal spray Place 1 spray into both nostrils daily. 09/16/17  Yes Pucilowska, Jolanta B, MD  gabapentin (NEURONTIN) 600 MG tablet Take 0.5 tablets (300 mg total) by mouth 3 (three) times daily. 09/16/17  Yes Pucilowska, Jolanta B, MD  hydrOXYzine (ATARAX/VISTARIL) 50 MG  tablet Take 1 tablet (50 mg total) by mouth 3 (three) times daily. 09/16/17  Yes Pucilowska, Jolanta B, MD  lithium carbonate (LITHOBID) 300 MG CR tablet Take 2 tablets (600 mg total) by mouth every 12 (twelve) hours. 09/16/17  Yes Pucilowska, Jolanta B, MD  loratadine (CLARITIN) 10 MG tablet Take 1 tablet (10 mg total) by mouth daily. 09/16/17  Yes Pucilowska, Jolanta B, MD  pantoprazole (PROTONIX) 40 MG tablet Take 1 tablet (40 mg total) by mouth daily. 09/16/17  Yes Pucilowska, Jolanta B, MD  QUEtiapine (SEROQUEL) 50 MG tablet Take 1 tablet (50 mg total) by mouth at bedtime. 09/16/17  Yes Pucilowska, Jolanta B,  MD  venlafaxine XR (EFFEXOR-XR) 75 MG 24 hr capsule Take 3 capsules (225 mg total) by mouth daily with breakfast. 09/16/17  Yes Pucilowska, Jolanta B, MD    Family History Family History  Problem Relation Age of Onset  . Cancer Mother   . Heart attack Father   . Mental illness Sister   . Alcoholism Brother     Social History Social History   Tobacco Use  . Smoking status: Current Every Day Smoker    Packs/day: 1.00    Years: 38.00    Pack years: 38.00    Types: Cigarettes  . Smokeless tobacco: Never Used  Substance Use Topics  . Alcohol use: No  . Drug use: Yes    Frequency: 2.0 times per week    Types: Marijuana    Comment: Weekly use + THC     Allergies   Nuedexta [dextromethorphan-quinidine]   Review of Systems Review of Systems Ten systems reviewed and are negative for acute change, except as noted in the HPI.    Physical Exam Updated Vital Signs BP 117/73   Pulse 87   Temp 98 F (36.7 C) (Oral)   Resp 20   SpO2 93%   Physical Exam  Constitutional: He is oriented to person, place, and time. He appears well-developed and well-nourished. No distress.  Nontoxic appearing  HENT:  Head: Normocephalic and atraumatic.  Eyes: Conjunctivae and EOM are normal. No scleral icterus.  Neck: Normal range of motion.  Cardiovascular: Normal rate, regular rhythm and intact distal pulses.  DP pulse 2+ in the RLE  Pulmonary/Chest: Effort normal. No respiratory distress.  Respirations even and unlabored  Musculoskeletal: Normal range of motion. He exhibits edema.  Edema and erythema to the distal RLQ, limited from the R ankle to the knee.  Neurological: He is alert and oriented to person, place, and time. He exhibits normal muscle tone. Coordination normal.  Sensation to light touch intact in the RLE  Skin: Skin is warm and dry. No rash noted. He is not diaphoretic. No pallor.  Mild erythema and warmth to the distal RLQ  Psychiatric: He has a normal mood and affect.  His behavior is normal.  States he is suicidal. States "I had a noose around my neck tonight".  Nursing note and vitals reviewed.    ED Treatments / Results  Labs (all labs ordered are listed, but only abnormal results are displayed) Labs Reviewed  BASIC METABOLIC PANEL - Abnormal; Notable for the following components:      Result Value   Calcium 8.7 (*)    All other components within normal limits  CBC WITH DIFFERENTIAL/PLATELET  RAPID URINE DRUG SCREEN, HOSP PERFORMED  LITHIUM LEVEL    EKG  EKG Interpretation None       Radiology Dg Tibia/fibula Right  Result Date: 10/06/2017 CLINICAL DATA:  Pain and swelling of the right lower leg for 3 days. No known injury. EXAM: RIGHT TIBIA AND FIBULA - 2 VIEW COMPARISON:  MRI lower extremities 06/06/2017. Right tib-fib views 02/05/2008 FINDINGS: Diffuse soft tissue swelling within duration of the fat shadows consistent with edema or cellulitis. Scattered soft tissue calcifications likely representing phleboliths. No radiopaque soft tissue foreign bodies or soft tissue gas collections. Tibia and fibula appear intact. No acute bony abnormalities. IMPRESSION: Soft tissue infiltration in the right lower leg consistent with cellulitis or edema. No acute bony abnormalities. Electronically Signed   By: Burman Nieves M.D.   On: 10/06/2017 02:55    Procedures Procedures (including critical care time)  Lower Extremity Duplex (per Care Everywhere) from 10/02/17 Impressions Right Impression There appears to be CHRONIC DVT seen in the RIGHT lower extremity at the level of the distal SFV. There is a moderate amount of edema in the lower extremity. Patient Status:STAT. Study Location: ER. Technical Quality:Adequate visualization. Velocities are measured in cm/s ; Diameters are measured in mm Right Lower Extremities DVT Study Measurements Right 2D Measurements +--------------+-----------+----------------+-----------+ !Location       !Visualized !Compressibility !Thrombosis ! +--------------+-----------+----------------+-----------+ !Common Femoral!Yes        !Yes             !None       ! +--------------+-----------+----------------+-----------+ !GSV Thigh     !Yes        !Yes             !None       ! +--------------+-----------+----------------+-----------+ !PROF          !Yes        !Yes             !None       ! +--------------+-----------+----------------+-----------+ !Prox Femoral  !Yes        !Yes             !None       ! +--------------+-----------+----------------+-----------+ !Mid Femoral   !Yes        !Yes             !None       ! +--------------+-----------+----------------+-----------+ !Dist Femoral  !Yes        !No              !Chronic    ! +--------------+-----------+----------------+-----------+ !Popliteal     !Yes        !Yes             !None       ! +--------------+-----------+----------------+-----------+ !SSV           !Yes        !Yes             !None       ! +--------------+-----------+----------------+-----------+ !PTV           !Yes        !Yes             !None       ! +--------------+-----------+----------------+-----------+ !ATV           !Yes        !Yes             !None       ! +--------------+-----------+----------------+-----------+ !Peroneal      !Yes        !Yes             !None       ! +--------------+-----------+----------------+-----------+ !GSV           !  Yes        !Yes             !None       ! +--------------+-----------+----------------+-----------+  Medications Ordered in ED Medications  gabapentin (NEURONTIN) tablet 300 mg (not administered)  lithium carbonate (LITHOBID) CR tablet 600 mg (not administered)  pantoprazole (PROTONIX) EC tablet 40 mg (not administered)  QUEtiapine (SEROQUEL) tablet 50 mg (not administered)  venlafaxine XR (EFFEXOR-XR) 24 hr capsule 225 mg (not administered)  clindamycin (CLEOCIN) capsule 300 mg (not administered)     Initial  Impression / Assessment and Plan / ED Course  I have reviewed the triage vital signs and the nursing notes.  Pertinent labs & imaging results that were available during my care of the patient were reviewed by me and considered in my medical decision making (see chart for details).     56 year old male presents to the emergency department for evaluation of suicidal ideations and right lower extremity swelling.  Patient with erythema and warmth to the right lower leg.  He states that the symptoms have been ongoing for the past 3 days; however, they have truly been going on for at least a week as [facility]/2017.  Patient with no fever today.  No leukocytosis or red linear streaking.  White blood cell count is actually improved compared to labs drawn 4 days ago.  An x-ray was performed which shows edema without gaseous presents.  Patient has been on clindamycin since his prior ED evaluation.  He reports being compliant with this regimen.  He does have a chronic DVT seen on lower extremity duplex from 10/02/2017.  It is possible that his persistent swelling and erythema may be secondary to persistent distal femoral DVT. Plan to continue Clindamycin and refer to [facility] regarding management of chronic DVT in the RLE.  The patient has been medically cleared for TTS evaluation; pending recommendations.  Disposition to be determined by oncoming ED provider.   Vitals:   10/05/17 2220 10/06/17 0136 10/06/17 0138 10/06/17 0625  BP: 124/82 124/79  117/73  Pulse: 84  81 87  Resp: 18   20  Temp: 98 F (36.7 C)     TempSrc: Oral     SpO2: 98%  96% 93%    Final Clinical Impressions(s) / ED Diagnoses   Final diagnoses:  Swelling of right lower extremity  Suicidal ideation    ED Discharge Orders    None       Antony MaduraHumes, Brance Dartt, PA-C 10/06/17 [id number]    Palumbo, April, MD 10/06/17 [phone]

## 2017-10-06 NOTE — ED Notes (Signed)
[facility] Expected date:  Expected time:  Means of arrival:  Comments: Room b

## 2017-10-07 ENCOUNTER — Other Ambulatory Visit: Payer: Self-pay

## 2017-10-07 ENCOUNTER — Telehealth: Payer: Self-pay | Admitting: Vascular Surgery

## 2017-10-07 DIAGNOSIS — I771 Stricture of artery: Secondary | ICD-10-CM

## 2017-10-07 NOTE — Telephone Encounter (Signed)
-----   Message from Sharee PimpleMarilyn K McChesney, RN sent at 10/06/2017  3:59 PM EST ----- Regarding: 2-3 weeks with lab and see Dr. Randie Heinzain   ----- Message ----- From: Maeola Harmanain, Brandon Christopher, MD Sent: 10/06/2017   9:50 AM To: [facility] Charge Pool  Vantage BingJeffrey Crossan [phone] [birth year]  This patient was referred from the ED, he needs a ivc/iliac vein duplex in the next 2-3 weeks and to see me.   Thanks, bc

## 2017-10-07 NOTE — Telephone Encounter (Signed)
Sched lab 11/10/17 at 8:00 and MD 11/11/17 at 12:45. Ph# not working, mailed appt letters.

## 2017-10-10 ENCOUNTER — Other Ambulatory Visit: Payer: Self-pay

## 2017-10-10 DIAGNOSIS — I739 Peripheral vascular disease, unspecified: Secondary | ICD-10-CM

## 2017-11-10 ENCOUNTER — Encounter (HOSPITAL_COMMUNITY): Payer: Self-pay

## 2017-11-11 ENCOUNTER — Encounter (HOSPITAL_COMMUNITY): Payer: Self-pay

## 2017-11-11 ENCOUNTER — Ambulatory Visit: Payer: Medicare HMO | Admitting: Vascular Surgery

## 2017-12-20 ENCOUNTER — Encounter (HOSPITAL_COMMUNITY): Payer: Self-pay | Admitting: Family Medicine

## 2017-12-20 ENCOUNTER — Emergency Department (HOSPITAL_COMMUNITY)
Admission: EM | Admit: 2017-12-20 | Discharge: 2017-12-21 | Disposition: A | Discharge status: Home / Self Care | Payer: Medicare HMO | Attending: Emergency Medicine | Admitting: Emergency Medicine

## 2017-12-20 DIAGNOSIS — Z79899 Other long term (current) drug therapy: Secondary | ICD-10-CM | POA: Diagnosis not present

## 2017-12-20 DIAGNOSIS — F314 Bipolar disorder, current episode depressed, severe, without psychotic features: Secondary | ICD-10-CM | POA: Diagnosis not present

## 2017-12-20 DIAGNOSIS — F1210 Cannabis abuse, uncomplicated: Secondary | ICD-10-CM | POA: Insufficient documentation

## 2017-12-20 DIAGNOSIS — F39 Unspecified mood [affective] disorder: Secondary | ICD-10-CM | POA: Diagnosis present

## 2017-12-20 DIAGNOSIS — Z046 Encounter for general psychiatric examination, requested by authority: Secondary | ICD-10-CM | POA: Diagnosis present

## 2017-12-20 DIAGNOSIS — F17210 Nicotine dependence, cigarettes, uncomplicated: Secondary | ICD-10-CM | POA: Diagnosis not present

## 2017-12-20 DIAGNOSIS — R45851 Suicidal ideations: Secondary | ICD-10-CM | POA: Insufficient documentation

## 2017-12-20 LAB — CBC
HCT: 46.2 % (ref 39.0–52.0)
Hemoglobin: 15.9 g/dL (ref 13.0–17.0)
MCH: 29.8 pg (ref 26.0–34.0)
MCHC: 34.4 g/dL (ref 30.0–36.0)
MCV: 86.7 fL (ref 78.0–100.0)
PLATELETS: 253 10*3/uL (ref 150–400)
RBC: 5.33 MIL/uL (ref 4.22–5.81)
RDW: 14.1 % (ref 11.5–15.5)
WBC: 10.9 10*3/uL — AB (ref 4.0–10.5)

## 2017-12-20 LAB — SALICYLATE LEVEL

## 2017-12-20 LAB — COMPREHENSIVE METABOLIC PANEL
ALT: 25 U/L (ref 17–63)
AST: 26 U/L (ref 15–41)
Albumin: 4.3 g/dL (ref 3.5–5.0)
Alkaline Phosphatase: 60 U/L (ref 38–126)
Anion gap: 7 (ref 5–15)
BILIRUBIN TOTAL: 0.2 mg/dL — AB (ref 0.3–1.2)
BUN: 21 mg/dL — AB (ref 6–20)
CO2: 24 mmol/L (ref 22–32)
CREATININE: 1.1 mg/dL (ref 0.61–1.24)
Calcium: 8.9 mg/dL (ref 8.9–10.3)
Chloride: 106 mmol/L (ref 101–111)
GFR calc non Af Amer: >60 mL/min (ref >60)
Glucose, Bld: 97 mg/dL (ref 65–99)
POTASSIUM: 4.3 mmol/L (ref 3.5–5.1)
Sodium: 137 mmol/L (ref 135–145)
Total Protein: 7.3 g/dL (ref 6.5–8.1)

## 2017-12-20 LAB — ACETAMINOPHEN LEVEL: Acetaminophen (Tylenol), Serum: <10 ug/mL — ABNORMAL LOW (ref 10–30)

## 2017-12-20 LAB — LITHIUM LEVEL

## 2017-12-20 LAB — ETHANOL

## 2017-12-20 NOTE — ED Triage Notes (Signed)
[place] [facility] [place] [facility]. Patient reports he has been suicidal since last week but it got worse after the blood moon 2-3 nights ago. Patient reports he is disabled and does not want to live. He states his plan is to walk in front of traffic. Patient is calm and cooperative during triage.

## 2017-12-20 NOTE — ED Notes (Signed)
Bed: [facility] Expected date:  Expected time:  Means of arrival:  Comments: 

## 2017-12-20 NOTE — ED Provider Notes (Signed)
[facility]   CSN: [id number] Arrival date & time: 12/20/17  2125     History   Chief Complaint Chief Complaint  Patient presents with  . Psychiatric Evaluation    HPI Anthony [name] is a 57 y.o. male.  The history is provided by the patient and medical records.     57 year old male with history of cannabis abuse, chronic left shoulder pain, depression, episodic mood disorder, PTSD, seizures, presenting to the ED for psychiatric evaluation.  Patient states he has been feeling suicidal over the past 7 days or so, but got worse after the blood moving.  States he is currently disabled and cannot work to earn any money.  States he recently was homeless and has since gotten into a new apartment so he feels like he should be happy but he is not.  States he is just tired of living in this "shitty world".  He states he wants to walk out in front of traffic.  He denies any homicidal ideation.  No hallucinations.  Denies any alcohol use.  Does continue using marijuana.  States he has not been on his regularly scheduled medications in about a month as his prescriptions lapsed.  States because of this he has had a lot of trouble sleeping and functioning in general.  Past Medical History:  Diagnosis Date  . Bell's palsy   . Cannabis abuse   . Cellulitis of right leg 04/2017  . Chronic left shoulder pain   . Depression   . Encephalitis   . Episodic mood disorder (HCC)   . Homelessness   . Post traumatic stress disorder   . Seizures (HCC)    last seizure 12 yrs ago  . Sepsis (HCC) 04/2017    Patient Active Problem List   Diagnosis Date Noted  . COPD (chronic obstructive pulmonary disease) (HCC) 09/13/2017  . GERD (gastroesophageal reflux disease) 09/13/2017  . Cannabis use disorder, moderate, dependence (HCC) 09/12/2017  . Tobacco use disorder 09/12/2017  . Cocaine use disorder, severe, dependence (HCC) 01/03/2017  . History of  encephalitis 12/10/2016  . Bipolar I disorder, most recent episode depressed, severe without psychotic features (HCC) 12/09/2016  . Allergic reaction   . Rash   . Suicidal ideation   . PTSD (post-traumatic stress disorder) 09/12/2014  . Episodic mood disorder (HCC) 09/11/2014    Past Surgical History:  Procedure Laterality Date  . APPENDECTOMY    . brain damage    . CHOLECYSTECTOMY    . FOOT SURGERY Bilateral   . KNEE SURGERY Right        Home Medications    Prior to Admission medications   Medication Sig Start Date End Date Taking? Authorizing Provider  albuterol (PROVENTIL HFA;VENTOLIN HFA) 108 (90 Base) MCG/ACT inhaler Inhale 1 puff into the lungs every 6 (six) hours as needed for wheezing or shortness of breath. 09/16/17  Yes Pucilowska, Jolanta B, MD  fluticasone (FLONASE) 50 MCG/ACT nasal spray Place 1 spray into both nostrils daily. 09/16/17  Yes Pucilowska, Jolanta B, MD  gabapentin (NEURONTIN) 600 MG tablet Take 0.5 tablets (300 mg total) by mouth 3 (three) times daily. 09/16/17  Yes Pucilowska, Jolanta B, MD  hydrOXYzine (ATARAX/VISTARIL) 50 MG tablet Take 1 tablet (50 mg total) by mouth 3 (three) times daily. 09/16/17  Yes Pucilowska, Jolanta B, MD  lithium carbonate (LITHOBID) 300 MG CR tablet Take 2 tablets (600 mg total) by mouth every 12 (twelve) hours. 09/16/17  Yes Pucilowska, Ellin Goodie, MD  loratadine (CLARITIN) 10 MG tablet Take 1 tablet (10 mg total) by mouth daily. 09/16/17  Yes Pucilowska, Jolanta B, MD  pantoprazole (PROTONIX) 40 MG tablet Take 1 tablet (40 mg total) by mouth daily. 09/16/17  Yes Pucilowska, Jolanta B, MD  QUEtiapine (SEROQUEL) 50 MG tablet Take 1 tablet (50 mg total) by mouth at bedtime. 09/16/17  Yes Pucilowska, Jolanta B, MD  venlafaxine XR (EFFEXOR-XR) 75 MG 24 hr capsule Take 3 capsules (225 mg total) by mouth daily with breakfast. 09/16/17  Yes Pucilowska, Jolanta B, MD    Family History Family History  Problem Relation Age of Onset    . Cancer Mother   . Heart attack Father   . Mental illness Sister   . Alcoholism Brother     Social History Social History   Tobacco Use  . Smoking status: Current Every Day Smoker    Packs/day: 1.00    Years: 38.00    Pack years: 38.00    Types: Cigarettes  . Smokeless tobacco: Never Used  Substance Use Topics  . Alcohol use: No  . Drug use: Yes    Frequency: 2.0 times per week    Types: Marijuana    Comment: Denies but has a history.      Allergies   Nuedexta [dextromethorphan-quinidine]   Review of Systems Review of Systems  Psychiatric/Behavioral: Positive for suicidal ideas.  All other systems reviewed and are negative.    Physical Exam Updated Vital Signs BP 135/81 (BP Location: Left Arm)   Pulse 70   Temp (!) 97.5 F (36.4 C) (Oral)   Resp 20   SpO2 100%   Physical Exam  Constitutional: He is oriented to person, place, and time. He appears well-developed and well-nourished.  HENT:  Head: Normocephalic and atraumatic.  Mouth/Throat: Oropharynx is clear and moist.  Eyes: Conjunctivae and EOM are normal. Pupils are equal, round, and reactive to light.  Neck: Normal range of motion.  Cardiovascular: Normal rate, regular rhythm and normal heart sounds.  Pulmonary/Chest: Effort normal and breath sounds normal. No stridor. No respiratory distress.  Abdominal: Soft. Bowel sounds are normal.  Musculoskeletal: Normal range of motion.  Neurological: He is alert and oriented to person, place, and time.  Skin: Skin is warm and dry.  Psychiatric: He has a normal mood and affect. He is not actively hallucinating. He expresses suicidal ideation. He expresses no homicidal ideation. He expresses suicidal plans. He expresses no homicidal plans.  SI with plan to walk out into traffic Denies HI/AVH  Nursing note and vitals reviewed.    ED Treatments / Results  Labs (all labs ordered are listed, but only abnormal results are displayed) Labs Reviewed   COMPREHENSIVE METABOLIC PANEL - Abnormal; Notable for the following components:      Result Value   BUN 21 (*)    Total Bilirubin 0.2 (*)    All other components within normal limits  ACETAMINOPHEN LEVEL - Abnormal; Notable for the following components:   Acetaminophen (Tylenol), Serum <10 (*)    All other components within normal limits  CBC - Abnormal; Notable for the following components:   WBC 10.9 (*)    All other components within normal limits  LITHIUM LEVEL - Abnormal; Notable for the following components:   Lithium Lvl <0.06 (*)    All other components within normal limits  ETHANOL  SALICYLATE LEVEL  RAPID URINE DRUG SCREEN, HOSP PERFORMED    EKG  EKG Interpretation None       Radiology  No results found.  Procedures Procedures (including critical care time)  Medications Ordered in ED Medications - No data to display   Initial Impression / Assessment and Plan / ED Course  I have reviewed the triage vital signs and the nursing notes.  Pertinent labs & imaging results that were available during my care of the patient were reviewed by me and considered in my medical decision making (see chart for details).  57 year old male here with suicidal ideation.  Has been off of his medications for about 1 month.  States he has a walk out into traffic.  He denies any hallucinations.  No homicidal ideation.  Screening labs overall reassuring.  Patient medically cleared.  Home meds have been ordered.  TTS has attempted to evaluate patient, however patient unable to be aroused from deep sleep.  Will attempt to evaluate later today.  Final Clinical Impressions(s) / ED Diagnoses   Final diagnoses:  Suicidal ideation    ED Discharge Orders    None       Garlon HatchetSanders, Lurene Robley M, PA-C 12/21/17 [id number]    Maia PlanLong, Joshua G, MD 12/21/17 1014

## 2017-12-21 DIAGNOSIS — F314 Bipolar disorder, current episode depressed, severe, without psychotic features: Secondary | ICD-10-CM | POA: Diagnosis not present

## 2017-12-21 LAB — RAPID URINE DRUG SCREEN, HOSP PERFORMED
AMPHETAMINES: NOT DETECTED
BARBITURATES: NOT DETECTED
BENZODIAZEPINES: NOT DETECTED
COCAINE: NOT DETECTED
Opiates: NOT DETECTED
TETRAHYDROCANNABINOL: NOT DETECTED

## 2017-12-21 MED ORDER — NICOTINE 21 MG/24HR TD PT24
21.0000 mg | MEDICATED_PATCH | Freq: Every day | TRANSDERMAL | Status: DC
  Administered 2017-12-21: 21 mg via TRANSDERMAL
  Filled 2017-12-21: qty 1

## 2017-12-21 MED ORDER — LITHIUM CARBONATE ER 300 MG PO TBCR
600.0000 mg | EXTENDED_RELEASE_TABLET | Freq: Two times a day (BID) | ORAL | Status: DC
  Administered 2017-12-21 (×2): 600 mg via ORAL
  Filled 2017-12-21 (×2): qty 2

## 2017-12-21 MED ORDER — ACETAMINOPHEN 325 MG PO TABS: 650.0000 mg | ORAL_TABLET | ORAL | Status: DC | PRN

## 2017-12-21 MED ORDER — HYDROXYZINE HCL 25 MG PO TABS
50.0000 mg | ORAL_TABLET | Freq: Three times a day (TID) | ORAL | Status: DC
  Administered 2017-12-21: 50 mg via ORAL
  Filled 2017-12-21: qty 2

## 2017-12-21 MED ORDER — FLUTICASONE PROPIONATE 50 MCG/ACT NA SUSP
1.0000 | Freq: Every day | NASAL | Status: DC
  Administered 2017-12-21: 1 via NASAL
  Filled 2017-12-21: qty 16

## 2017-12-21 MED ORDER — ALBUTEROL SULFATE HFA 108 (90 BASE) MCG/ACT IN AERS: 1.0000 | INHALATION_SPRAY | Freq: Four times a day (QID) | RESPIRATORY_TRACT | Status: DC | PRN

## 2017-12-21 MED ORDER — ZOLPIDEM TARTRATE 5 MG PO TABS: 5.0000 mg | ORAL_TABLET | Freq: Every evening | ORAL | Status: DC | PRN

## 2017-12-21 MED ORDER — VENLAFAXINE HCL ER 75 MG PO CP24
225.0000 mg | ORAL_CAPSULE | Freq: Every day | ORAL | Status: DC
  Administered 2017-12-21: 225 mg via ORAL
  Filled 2017-12-21: qty 3

## 2017-12-21 MED ORDER — QUETIAPINE FUMARATE 50 MG PO TABS
50.0000 mg | ORAL_TABLET | Freq: Every day | ORAL | Status: DC
  Administered 2017-12-21: 50 mg via ORAL
  Filled 2017-12-21: qty 1

## 2017-12-21 MED ORDER — GABAPENTIN 300 MG PO CAPS
300.0000 mg | ORAL_CAPSULE | Freq: Three times a day (TID) | ORAL | Status: DC
  Administered 2017-12-21: 300 mg via ORAL
  Filled 2017-12-21: qty 1

## 2017-12-21 MED ORDER — PANTOPRAZOLE SODIUM 40 MG PO TBEC
40.0000 mg | DELAYED_RELEASE_TABLET | Freq: Every day | ORAL | Status: DC
  Administered 2017-12-21: 40 mg via ORAL
  Filled 2017-12-21: qty 1

## 2017-12-21 MED ORDER — ALUM & MAG HYDROXIDE-SIMETH 200-200-20 MG/5ML PO SUSP
30.0000 mL | Freq: Four times a day (QID) | ORAL | Status: DC | PRN
  Administered 2017-12-21: 30 mL via ORAL
  Filled 2017-12-21: qty 30

## 2017-12-21 MED ORDER — ONDANSETRON HCL 4 MG PO TABS: 4.0000 mg | ORAL_TABLET | Freq: Three times a day (TID) | ORAL | Status: DC | PRN

## 2017-12-21 MED ORDER — LORATADINE 10 MG PO TABS
10.0000 mg | ORAL_TABLET | Freq: Every day | ORAL | Status: DC
  Administered 2017-12-21: 10 mg via ORAL
  Filled 2017-12-21: qty 1

## 2017-12-21 NOTE — Discharge Instructions (Signed)
For your behavioral health needs, you are advised to follow up with [facility] at your earliest opportunity:       [facility]      [address]      [place], [place] [address]       [phone]
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
## 2017-12-21 NOTE — BHH Counselor (Signed)
  LPCA attempted to complete assessment on patient multiple times but assessment was not completed because pt was unable to be aroused in order to complete the assessment. TTS will complete assessment at a later time.  Hailyn Zarr L. Sallee Hogrefe, MS, LPCA, [facility] Therapeutic Triage Specialist  [phone]

## 2017-12-21 NOTE — ED Notes (Signed)
Pt d/c home per MD order. Discharge summary reviewed with pt. Pt verbalizes understanding. Pt denies SI/HI/AVH. Personal property returned to pt. Pt signed e-signature. Ambulatory off unit.  

## 2017-12-21 NOTE — ED Notes (Signed)
Patient is attempting to urinate.

## 2017-12-21 NOTE — BHH Suicide Risk Assessment (Signed)
Suicide Risk Assessment  Discharge Assessment   [facility]   Principal Problem: Episodic mood disorder [facility](HCC) Discharge Diagnoses:  Patient Active Problem List   Diagnosis Date Noted  . COPD (chronic obstructive pulmonary disease) (HCC) [J44.9] 09/13/2017  . GERD (gastroesophageal reflux disease) [K21.9] 09/13/2017  . Cannabis use disorder, moderate, dependence (HCC) [F12.20] 09/12/2017  . Tobacco use disorder [F17.200] 09/12/2017  . Cocaine use disorder, severe, dependence (HCC) [F14.20] 01/03/2017  . History of encephalitis [Z86.61] 12/10/2016  . Bipolar I disorder, most recent episode depressed, severe without psychotic features (HCC) [F31.4] 12/09/2016  . Allergic reaction [T78.40XA]   . Rash [R21]   . Suicidal ideation [R45.851]   . PTSD (post-traumatic stress disorder) [F43.10] 09/12/2014  . Episodic mood disorder (HCC) [F39] 09/11/2014    Total Time spent with patient: 30 minutes  Musculoskeletal: Strength & Muscle Tone: within normal limits Gait & Station: normal Patient leans: N/A  Psychiatric Specialty Exam:   Blood pressure 114/73, pulse 77, temperature 97.6 F (36.4 C), resp. rate 20, SpO2 99 %.There is no height or weight on file to calculate BMI.  General Appearance: Casual  Eye Contact::  Fair  Speech:  Clear and Coherent409  Volume:  Normal  Mood:  Depressed  Affect:  Congruent and Depressed  Thought Process:  Coherent, Goal Directed and Linear  Orientation:  Full (Time, Place, and Person)  Thought Content:  Logical  Suicidal Thoughts:  No  Homicidal Thoughts:  No  Memory:  Immediate;   Good Recent;   Good Remote;   Fair  Judgement:  Poor  Insight:  Lacking  Psychomotor Activity:  Normal  Concentration:  Good  Recall:  Good  Fund of Knowledge:Good  Language: Good  Akathisia:  No  Handed:  Right  AIMS (if indicated):     Assets:  ArchitectCommunication Skills Financial Resources/Insurance Housing Social Support  Sleep:      Cognition: WNL  ADL's:  Intact   Mental Status Per Nursing Assessment::   On Admission:   Depressed  Demographic Factors:  Male  Loss Factors: Financial problems/change in socioeconomic status  Historical Factors: Impulsivity  Risk Reduction Factors:   Sense of responsibility to family  Continued Clinical Symptoms:  Depression:   Comorbid alcohol abuse/dependence Impulsivity  Cognitive Features That Contribute To Risk:  Closed-mindedness    Suicide Risk:  Minimal: No identifiable suicidal ideation.  Patients presenting with no risk factors but with morbid ruminations; may be classified as minimal risk based on the severity of the depressive symptoms    Plan Of Care/Follow-up recommendations:  Activity:  as tolerated Diet:  Heart healthy  Laveda AbbeLaurie Britton Chanteria Haggard, NP 12/21/2017, 11:52 AM

## 2017-12-21 NOTE — BH Assessment (Signed)
[facility]  Per Juanetta BeetsJacqueline Norman, DO, this pt does not require psychiatric hospitalization at this time.  [facility].  This has been included in pt's discharge instructions.  Pt's nurse, Morrie Sheldonshley, has been notified.  Doylene Canninghomas Dewitt Judice, MA Triage Specialist [phone]

## 2017-12-21 NOTE — ED Notes (Signed)
Patient was wanded. [facility]

## 2017-12-21 NOTE — ED Notes (Signed)
Patient reports SI with a plan to walk in front of traffic. Patient calm and cooperative at this time. Plan pf care discussed. Encouragement and support provided and safety maintain. Q 15 min safety checks in place and video monitoring.

## 2017-12-21 NOTE — ED Notes (Signed)
Gave report to [place], Charity fundraiserN for [facility]. Notified Security to wand patient.

## 2018-01-05 ENCOUNTER — Other Ambulatory Visit: Payer: Self-pay

## 2018-01-05 ENCOUNTER — Emergency Department (HOSPITAL_COMMUNITY): Payer: Medicare HMO

## 2018-01-05 ENCOUNTER — Encounter (HOSPITAL_COMMUNITY): Payer: Self-pay | Admitting: Emergency Medicine

## 2018-01-05 DIAGNOSIS — F17210 Nicotine dependence, cigarettes, uncomplicated: Secondary | ICD-10-CM | POA: Insufficient documentation

## 2018-01-05 DIAGNOSIS — Z79899 Other long term (current) drug therapy: Secondary | ICD-10-CM | POA: Diagnosis not present

## 2018-01-05 DIAGNOSIS — M7552 Bursitis of left shoulder: Secondary | ICD-10-CM | POA: Diagnosis not present

## 2018-01-05 DIAGNOSIS — R45851 Suicidal ideations: Secondary | ICD-10-CM | POA: Insufficient documentation

## 2018-01-05 DIAGNOSIS — F3131 Bipolar disorder, current episode depressed, mild: Secondary | ICD-10-CM | POA: Insufficient documentation

## 2018-01-05 DIAGNOSIS — R079 Chest pain, unspecified: Secondary | ICD-10-CM | POA: Diagnosis present

## 2018-01-05 LAB — ACETAMINOPHEN LEVEL

## 2018-01-05 LAB — COMPREHENSIVE METABOLIC PANEL
ALBUMIN: 4.1 g/dL (ref 3.5–5.0)
ALT: 29 U/L (ref 17–63)
ANION GAP: 11 (ref 5–15)
AST: 28 U/L (ref 15–41)
Alkaline Phosphatase: 64 U/L (ref 38–126)
BILIRUBIN TOTAL: 0.4 mg/dL (ref 0.3–1.2)
BUN: 21 mg/dL — ABNORMAL HIGH (ref 6–20)
CO2: 25 mmol/L (ref 22–32)
Calcium: 9 mg/dL (ref 8.9–10.3)
Chloride: 104 mmol/L (ref 101–111)
Creatinine, Ser: 1.22 mg/dL (ref 0.61–1.24)
GFR calc non Af Amer: >60 mL/min (ref >60)
GLUCOSE: 97 mg/dL (ref 65–99)
POTASSIUM: 4.1 mmol/L (ref 3.5–5.1)
Sodium: 140 mmol/L (ref 135–145)
TOTAL PROTEIN: 6.6 g/dL (ref 6.5–8.1)

## 2018-01-05 LAB — RAPID URINE DRUG SCREEN, HOSP PERFORMED
AMPHETAMINES: NOT DETECTED
Barbiturates: NOT DETECTED
Benzodiazepines: NOT DETECTED
Cocaine: POSITIVE — AB
OPIATES: NOT DETECTED
Tetrahydrocannabinol: NOT DETECTED

## 2018-01-05 LAB — CBC
HEMATOCRIT: 45.3 % (ref 39.0–52.0)
Hemoglobin: 15.6 g/dL (ref 13.0–17.0)
MCH: 29.7 pg (ref 26.0–34.0)
MCHC: 34.4 g/dL (ref 30.0–36.0)
MCV: 86.1 fL (ref 78.0–100.0)
Platelets: 269 10*3/uL (ref 150–400)
RBC: 5.26 MIL/uL (ref 4.22–5.81)
RDW: 14.1 % (ref 11.5–15.5)
WBC: 12.2 10*3/uL — ABNORMAL HIGH (ref 4.0–10.5)

## 2018-01-05 LAB — SALICYLATE LEVEL

## 2018-01-05 LAB — ETHANOL

## 2018-01-05 LAB — TROPONIN I

## 2018-01-05 NOTE — ED Triage Notes (Signed)
Pt arrived EMS from home for c/o chest pain. Per EMS pt was smoking a cigarette on scene, pt has a hx TBI and psychiatric disorders, has not been taking his meds 112/70 P98 RR17 100%18RAC. Given 324ASA and 4 nitro with EMS. Pain went from and 8 to a 3 with the nitro. 18RAC

## 2018-01-06 ENCOUNTER — Encounter (HOSPITAL_COMMUNITY): Payer: Self-pay | Admitting: Registered Nurse

## 2018-01-06 ENCOUNTER — Emergency Department (HOSPITAL_COMMUNITY)
Admission: EM | Admit: 2018-01-06 | Discharge: 2018-01-07 | Disposition: A | Discharge status: Other Acute Inpatient Hospital | Payer: Medicare HMO | Attending: Emergency Medicine | Admitting: Emergency Medicine

## 2018-01-06 DIAGNOSIS — M7552 Bursitis of left shoulder: Secondary | ICD-10-CM

## 2018-01-06 DIAGNOSIS — R45851 Suicidal ideations: Secondary | ICD-10-CM

## 2018-01-06 MED ORDER — ACETAMINOPHEN 500 MG PO TABS: 1000.0000 mg | ORAL_TABLET | Freq: Four times a day (QID) | ORAL | Status: DC | PRN

## 2018-01-06 MED ORDER — IBUPROFEN 400 MG PO TABS: 600.0000 mg | ORAL_TABLET | Freq: Three times a day (TID) | ORAL | Status: DC | PRN

## 2018-01-06 MED ORDER — ZIPRASIDONE MESYLATE 20 MG IM SOLR: 20.0000 mg | INTRAMUSCULAR | Status: DC | PRN

## 2018-01-06 MED ORDER — VENLAFAXINE HCL ER 75 MG PO CP24
225.0000 mg | ORAL_CAPSULE | Freq: Every day | ORAL | Status: DC
  Administered 2018-01-06 – 2018-01-07 (×2): 225 mg via ORAL
  Filled 2018-01-06 (×3): qty 1

## 2018-01-06 MED ORDER — LORAZEPAM 1 MG PO TABS: 1.0000 mg | ORAL_TABLET | ORAL | Status: DC | PRN

## 2018-01-06 MED ORDER — NICOTINE 21 MG/24HR TD PT24
21.0000 mg | MEDICATED_PATCH | Freq: Every day | TRANSDERMAL | Status: DC
  Administered 2018-01-06 – 2018-01-07 (×2): 21 mg via TRANSDERMAL
  Filled 2018-01-06 (×2): qty 1

## 2018-01-06 MED ORDER — ALBUTEROL SULFATE HFA 108 (90 BASE) MCG/ACT IN AERS: 1.0000 | INHALATION_SPRAY | Freq: Four times a day (QID) | RESPIRATORY_TRACT | Status: DC | PRN

## 2018-01-06 MED ORDER — LOPERAMIDE HCL 2 MG PO CAPS
4.0000 mg | ORAL_CAPSULE | Freq: Once | ORAL | Status: AC
  Administered 2018-01-06: 4 mg via ORAL
  Filled 2018-01-06: qty 2

## 2018-01-06 MED ORDER — ONDANSETRON HCL 4 MG PO TABS: 4.0000 mg | ORAL_TABLET | Freq: Three times a day (TID) | ORAL | Status: DC | PRN

## 2018-01-06 MED ORDER — PANTOPRAZOLE SODIUM 40 MG PO TBEC
40.0000 mg | DELAYED_RELEASE_TABLET | Freq: Every day | ORAL | Status: DC
  Administered 2018-01-06 – 2018-01-07 (×2): 40 mg via ORAL
  Filled 2018-01-06 (×2): qty 1

## 2018-01-06 MED ORDER — LITHIUM CARBONATE ER 300 MG PO TBCR
600.0000 mg | EXTENDED_RELEASE_TABLET | Freq: Two times a day (BID) | ORAL | Status: DC
  Administered 2018-01-06 – 2018-01-07 (×3): 600 mg via ORAL
  Filled 2018-01-06 (×4): qty 2

## 2018-01-06 MED ORDER — OLANZAPINE 5 MG PO TBDP: 10.0000 mg | ORAL_TABLET | Freq: Three times a day (TID) | ORAL | Status: DC | PRN

## 2018-01-06 MED ORDER — GABAPENTIN 300 MG PO CAPS
300.0000 mg | ORAL_CAPSULE | Freq: Three times a day (TID) | ORAL | Status: DC
  Administered 2018-01-06 – 2018-01-07 (×4): 300 mg via ORAL
  Filled 2018-01-06 (×4): qty 1

## 2018-01-06 MED ORDER — HYDROXYZINE HCL 50 MG PO TABS
50.0000 mg | ORAL_TABLET | Freq: Three times a day (TID) | ORAL | Status: DC
  Administered 2018-01-06 – 2018-01-07 (×4): 50 mg via ORAL
  Filled 2018-01-06 (×4): qty 1

## 2018-01-06 MED ORDER — QUETIAPINE FUMARATE 50 MG PO TABS
50.0000 mg | ORAL_TABLET | Freq: Every day | ORAL | Status: DC
  Administered 2018-01-06: 50 mg via ORAL
  Filled 2018-01-06: qty 1

## 2018-01-06 NOTE — ED Notes (Signed)
Pt in TTS 

## 2018-01-06 NOTE — BH Assessment (Signed)
BHH Assessment Progress Note  TTS consulted with Nira ConnJason Berry, NP who recommends continued observation and to be reassessed by psychiatry. EDP Pollina, Canary Brimhristopher J, MD and pt's nurse Maralyn SagoSarah, RN have been advised of the disposition.  Princess BruinsAquicha Sevyn Paredez, MSW, LCSW Therapeutic Triage Specialist  [phone]

## 2018-01-06 NOTE — ED Notes (Signed)
Pt c/o diarrhea, requesting medication. Dr. Criss AlvineGoldston aware.

## 2018-01-06 NOTE — ED Notes (Signed)
Called pt to be reassessed with no response 2X 

## 2018-01-06 NOTE — Progress Notes (Signed)
TTS attempted to call the cart in order to complete the assessment, says "call ended, cannot reach cart." Spoke with Irving BurtonEmily, RN who states they are currently assisting with another pt and will need more time to locate the cart and move the pt to a private room.  Princess BruinsAquicha Savier Trickett, MSW, LCSW Therapeutic Triage Specialist  [phone]

## 2018-01-06 NOTE — Progress Notes (Signed)
TTS consult ordered for the pt who is in a hall bed. Spoke with Irving BurtonEmily, RN who states the pt will not be ready to be assessed for at least 215-20 minutes because he is currently in the hallway. TTS will call back in order to assess the pt once he has been moved to a private room.  Princess BruinsAquicha Rhian Asebedo, MSW, LCSW Therapeutic Triage Specialist  [phone]
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
## 2018-01-06 NOTE — Consult Note (Signed)
  Anthony [name], 57 y.o., male [facility] with complaints of worsening depression and suicidal plan to walk into traffic.  Patient seen via telepsych by this provider; chart reviewed and consulted with Dr. Lucianne MussKumar on 01/06/18.  Patient was recently evaluated in ED 12/21/17 On evaluation Anthony. Anthony [name] reports  That he has had worsening depression since the recent break up with his wife.  "I caught her cheating on me and I left.  I been drilling on that a lot and depression got worse."  Patient continues to endorse suicidal ideation with plan to walk in front of a bus or truck "Something big"  Patient states that he does have a prior history of suicide attempt x 2.  Patient denies homicidal ideation, psychosis, and paranoia.  Patient states that he has had prior psychiatric inpatient [facility] and [facility].  States that he has [name] to restart services.  States that he was taking Seroquel, lithium, and Vistaril which worked for him when he was taking; but has been off of his medications for at least a month.  Patient not clear on who was prescribing medication but states that he was started on medication at his last hospital visit around October or November 2018.  Patient states that he lives alone and has no support system since he left his wife.  Patient is unable to contract for safety.    During evaluation Anthony [name] is alert/oriented x 4; calm/cooperative with pleasant affect.  He does not appear to be responding to internal/external stimuli or delusional thoughts .  Patient denies homicidal ideation, psychosis, and paranoia.  Patient continues to endorse suicidal ideation with plan and is unable to contract for safety.   Recommendation:  Inpatient psychiatric treatment  Disposition: Recommend psychiatric Inpatient admission when medically cleared.    Shuvon B. Rankin,NP

## 2018-01-06 NOTE — ED Provider Notes (Signed)
[facility] EMERGENCY DEPARTMENT Provider Note   CSN: [id number] Arrival date & time: 01/05/18  2142     History   Chief Complaint Chief Complaint  Patient presents with  . Chest Pain  . Suicidal    HPI Anthony [name] is a 57 y.o. male.  Patient presents with multiple complaints.  He reports he has chest pain.  This has been ongoing for days.  Pain is mostly in the shoulder and worsens when he moves his left arm.  He denies any injury.  Pain is severe if he tries to lift his arm above his head.  No shortness of breath, nausea, diaphoresis.  Patient also complains of worsening depression.  He reports that he has not had his psychiatric medications for a month or more.  He is depressed and suicidal, wants to run out into traffic.      Past Medical History:  Diagnosis Date  . Bell's palsy   . Cannabis abuse   . Cellulitis of right leg 04/2017  . Chronic left shoulder pain   . Depression   . Encephalitis   . Episodic mood disorder (HCC)   . Homelessness   . Post traumatic stress disorder   . Seizures (HCC)    last seizure 12 yrs ago  . Sepsis (HCC) 04/2017    Patient Active Problem List   Diagnosis Date Noted  . COPD (chronic obstructive pulmonary disease) (HCC) 09/13/2017  . GERD (gastroesophageal reflux disease) 09/13/2017  . Cannabis use disorder, moderate, dependence (HCC) 09/12/2017  . Tobacco use disorder 09/12/2017  . Cocaine use disorder, severe, dependence (HCC) 01/03/2017  . History of encephalitis 12/10/2016  . Bipolar I disorder, most recent episode depressed, severe without psychotic features (HCC) 12/09/2016  . Allergic reaction   . Rash   . Suicidal ideation   . PTSD (post-traumatic stress disorder) 09/12/2014  . Episodic mood disorder (HCC) 09/11/2014    Past Surgical History:  Procedure Laterality Date  . APPENDECTOMY    . brain damage    . CHOLECYSTECTOMY    . FOOT SURGERY Bilateral   . KNEE SURGERY Right         Home Medications    Prior to Admission medications   Medication Sig Start Date End Date Taking? Authorizing Provider  albuterol (PROVENTIL HFA;VENTOLIN HFA) 108 (90 Base) MCG/ACT inhaler Inhale 1 puff into the lungs every 6 (six) hours as needed for wheezing or shortness of breath. 09/16/17   Pucilowska, Jolanta B, MD  fluticasone (FLONASE) 50 MCG/ACT nasal spray Place 1 spray into both nostrils daily. 09/16/17   Pucilowska, Braulio Conte B, MD  gabapentin (NEURONTIN) 600 MG tablet Take 0.5 tablets (300 mg total) by mouth 3 (three) times daily. 09/16/17   Pucilowska, Braulio Conte B, MD  hydrOXYzine (ATARAX/VISTARIL) 50 MG tablet Take 1 tablet (50 mg total) by mouth 3 (three) times daily. 09/16/17   Pucilowska, Braulio Conte B, MD  lithium carbonate (LITHOBID) 300 MG CR tablet Take 2 tablets (600 mg total) by mouth every 12 (twelve) hours. 09/16/17   Pucilowska, Braulio Conte B, MD  loratadine (CLARITIN) 10 MG tablet Take 1 tablet (10 mg total) by mouth daily. 09/16/17   Pucilowska, Jolanta B, MD  pantoprazole (PROTONIX) 40 MG tablet Take 1 tablet (40 mg total) by mouth daily. 09/16/17   Pucilowska, Braulio Conte B, MD  QUEtiapine (SEROQUEL) 50 MG tablet Take 1 tablet (50 mg total) by mouth at bedtime. 09/16/17   Pucilowska, Jolanta B, MD  venlafaxine XR (EFFEXOR-XR) 75 MG 24 hr  capsule Take 3 capsules (225 mg total) by mouth daily with breakfast. 09/16/17   Pucilowska, Ellin GoodieJolanta B, MD    Family History Family History  Problem Relation Age of Onset  . Cancer Mother   . Heart attack Father   . Mental illness Sister   . Alcoholism Brother     Social History Social History   Tobacco Use  . Smoking status: Current Every Day Smoker    Packs/day: 1.00    Years: 38.00    Pack years: 38.00    Types: Cigarettes  . Smokeless tobacco: Never Used  Substance Use Topics  . Alcohol use: No  . Drug use: Yes    Frequency: 2.0 times per week    Types: Marijuana    Comment: Denies but has a history.       Allergies   Nuedexta [dextromethorphan-quinidine]   Review of Systems Review of Systems  Musculoskeletal: Positive for arthralgias.  Psychiatric/Behavioral: Positive for dysphoric mood and suicidal ideas.  All other systems reviewed and are negative.    Physical Exam Updated Vital Signs BP 109/71 (BP Location: Right Arm)   Pulse 88   Temp 98.1 F (36.7 C) (Oral)   Resp 18   SpO2 95%   Physical Exam  Constitutional: He is oriented to person, place, and time. He appears well-developed and well-nourished. No distress.  HENT:  Head: Normocephalic and atraumatic.  Right Ear: Hearing normal.  Left Ear: Hearing normal.  Nose: Nose normal.  Mouth/Throat: Oropharynx is clear and moist and mucous membranes are normal.  Eyes: Conjunctivae and EOM are normal. Pupils are equal, round, and reactive to light.  Neck: Normal range of motion. Neck supple.  Cardiovascular: Regular rhythm, S1 normal and S2 normal. Exam reveals no gallop and no friction rub.  No murmur heard. Pulmonary/Chest: Effort normal and breath sounds normal. No respiratory distress. He exhibits no tenderness.  Abdominal: Soft. Normal appearance and bowel sounds are normal. There is no hepatosplenomegaly. There is no tenderness. There is no rebound, no guarding, no tenderness at McBurney's point and negative Murphy's sign. No hernia.  Musculoskeletal:       Left shoulder: He exhibits decreased range of motion (Due to painful inhibition) and tenderness. He exhibits no deformity.  Neurological: He is alert and oriented to person, place, and time. He has normal strength. No cranial nerve deficit or sensory deficit. Coordination normal. GCS eye subscore is 4. GCS verbal subscore is 5. GCS motor subscore is 6.  Skin: Skin is warm, dry and intact. No rash noted. No cyanosis.  Psychiatric: His speech is normal. He is withdrawn. He exhibits a depressed mood. He expresses suicidal ideation. He expresses suicidal plans.   Nursing note and vitals reviewed.    ED Treatments / Results  Labs (all labs ordered are listed, but only abnormal results are displayed) Labs Reviewed  CBC - Abnormal; Notable for the following components:      Result Value   WBC 12.2 (*)    All other components within normal limits  COMPREHENSIVE METABOLIC PANEL - Abnormal; Notable for the following components:   BUN 21 (*)    All other components within normal limits  ACETAMINOPHEN LEVEL - Abnormal; Notable for the following components:   Acetaminophen (Tylenol), Serum <10 (*)    All other components within normal limits  RAPID URINE DRUG SCREEN, HOSP PERFORMED - Abnormal; Notable for the following components:   Cocaine POSITIVE (*)    All other components within normal limits  ETHANOL  SALICYLATE LEVEL  TROPONIN I  I-STAT TROPONIN, ED    EKG  EKG Interpretation  Date/Time:  Thursday January 05 2018 21:44:52 EST Ventricular Rate:  88 PR Interval:  140 QRS Duration: 88 QT Interval:  356 QTC Calculation: 430 R Axis:   18 Text Interpretation:  Normal sinus rhythm Normal ECG Confirmed by Zadie Rhine ([id number]) on 01/06/2018 4:00:14 AM       Radiology Dg Chest 2 View  Result Date: 01/05/2018 CLINICAL DATA:  Initial evaluation for acute chest pain. EXAM: CHEST  2 VIEW COMPARISON:  Prior radiograph from 09/10/2017. FINDINGS: The cardiac and mediastinal silhouettes are stable in size and contour, and remain within normal limits. The lungs are normally inflated. No airspace consolidation, pleural effusion, or pulmonary edema is identified. There is no pneumothorax. No acute osseous abnormality identified. IMPRESSION: No active cardiopulmonary disease. Electronically Signed   By: Rise Mu M.D.   On: 01/05/2018 22:19    Procedures Procedures (including critical care time)  Medications Ordered in ED Medications - No data to display   Initial Impression / Assessment and Plan / ED Course  I have reviewed the  triage vital signs and the nursing notes.  Pertinent labs & imaging results that were available during my care of the patient were reviewed by me and considered in my medical decision making (see chart for details).     Patient reports that he is having chest pain but he is actually having left shoulder pain.  He has tenderness to palpation over the subacromial bursa region and pain significantly worsens if he raises above his head.  This is clearly musculoskeletal, likely bursitis.  He does not require any further workup as his initial cardiac evaluation is unremarkable.  Patient has been evaluated multiple times for his suicidal ideation, persistently states that he wants to kill himself here tonight, will require repeat evaluation.  Final Clinical Impressions(s) / ED Diagnoses   Final diagnoses:  Suicidal ideation  Acute bursitis of left shoulder    ED Discharge Orders    None       Gilda Crease, MD 01/06/18 [phone]

## 2018-01-06 NOTE — ED Notes (Signed)
Pt's belongings inventoried and placed in locker 5. No valuables with security or meds with pharmacy.

## 2018-01-07 ENCOUNTER — Other Ambulatory Visit: Payer: Self-pay

## 2018-01-07 DIAGNOSIS — M7552 Bursitis of left shoulder: Secondary | ICD-10-CM | POA: Diagnosis not present

## 2018-01-07 NOTE — Progress Notes (Signed)
Patient meets criteria for inpatient treatment. [facility]. CS[facility] the [facility] for [facility]  [facility], [facility], [facility], [facility], [facility], [facility], [facility].  TTS will continue to seek bed placement.   Trula SladeHeather Smart, MSW, LCSW Clinical Social Worker 01/07/2018 8:18 AM

## 2018-01-07 NOTE — Progress Notes (Signed)
[facility] [place] in admissions. Pt may arrive anytime today. Accepting MD: Dr. Yehuda BuddSyeed. Number for report: [phone]. Pt will be admitted to the 'Main Building.' CSW contacted Becky RN to notify of disposition.  Trula SladeHeather Smart, MSW, LCSW Clinical Social Worker 01/07/2018 8:43 AM

## 2018-01-07 NOTE — [facility] Counselor (Signed)
Reassessment- Pt reports SI with a plan. Pt denies HI and AVH.   TTS will continue to seek placement.  Wolfgang PhoenixBrandi Violet Cart, [facility] Triage Specialist

## 2018-01-07 NOTE — ED Notes (Signed)
Pt eating sandwich and Caff-Free Coke given as requested.

## 2018-01-07 NOTE — ED Notes (Signed)
Re-TTS completed.  

## 2018-01-07 NOTE — ED Notes (Signed)
[facility] aware [facility] - voiced understanding and agreement w/tx plan.

## 2018-04-11 ENCOUNTER — Encounter (HOSPITAL_COMMUNITY): Payer: Self-pay | Admitting: Emergency Medicine

## 2018-04-11 ENCOUNTER — Emergency Department (HOSPITAL_COMMUNITY)
Admission: EM | Admit: 2018-04-11 | Discharge: 2018-04-11 | Discharge status: Against Medical Advice | Payer: Medicare HMO | Attending: Emergency Medicine | Admitting: Emergency Medicine

## 2018-04-11 ENCOUNTER — Other Ambulatory Visit: Payer: Self-pay

## 2018-04-11 DIAGNOSIS — R109 Unspecified abdominal pain: Secondary | ICD-10-CM | POA: Insufficient documentation

## 2018-04-11 DIAGNOSIS — R197 Diarrhea, unspecified: Secondary | ICD-10-CM

## 2018-04-11 DIAGNOSIS — R1110 Vomiting, unspecified: Secondary | ICD-10-CM | POA: Diagnosis present

## 2018-04-11 DIAGNOSIS — R112 Nausea with vomiting, unspecified: Secondary | ICD-10-CM | POA: Insufficient documentation

## 2018-04-11 DIAGNOSIS — R509 Fever, unspecified: Secondary | ICD-10-CM | POA: Insufficient documentation

## 2018-04-11 LAB — COMPREHENSIVE METABOLIC PANEL
ALBUMIN: 4.6 g/dL (ref 3.5–5.0)
ALK PHOS: 65 U/L (ref 38–126)
ALT: 26 U/L (ref 17–63)
AST: 21 U/L (ref 15–41)
Anion gap: 13 (ref 5–15)
BUN: 20 mg/dL (ref 6–20)
CHLORIDE: 107 mmol/L (ref 101–111)
CO2: 20 mmol/L — AB (ref 22–32)
CREATININE: 1.14 mg/dL (ref 0.61–1.24)
Calcium: 9.2 mg/dL (ref 8.9–10.3)
GFR calc Af Amer: >60 mL/min (ref >60)
GFR calc non Af Amer: >60 mL/min (ref >60)
Glucose, Bld: 86 mg/dL (ref 65–99)
POTASSIUM: 3.7 mmol/L (ref 3.5–5.1)
SODIUM: 140 mmol/L (ref 135–145)
Total Bilirubin: 0.9 mg/dL (ref 0.3–1.2)
Total Protein: 7.3 g/dL (ref 6.5–8.1)

## 2018-04-11 LAB — URINALYSIS, ROUTINE W REFLEX MICROSCOPIC
BILIRUBIN URINE: NEGATIVE
Glucose, UA: NEGATIVE mg/dL
Hgb urine dipstick: NEGATIVE
KETONES UR: 5 mg/dL — AB
LEUKOCYTES UA: NEGATIVE
Nitrite: NEGATIVE
PH: 6 (ref 5.0–8.0)
Protein, ur: 30 mg/dL — AB
SPECIFIC GRAVITY, URINE: 1.027 (ref 1.005–1.030)

## 2018-04-11 LAB — CBC
HCT: 50.7 % (ref 39.0–52.0)
HEMOGLOBIN: 17.3 g/dL — AB (ref 13.0–17.0)
MCH: 28.7 pg (ref 26.0–34.0)
MCHC: 34.1 g/dL (ref 30.0–36.0)
MCV: 84.2 fL (ref 78.0–100.0)
PLATELETS: 217 10*3/uL (ref 150–400)
RBC: 6.02 MIL/uL — AB (ref 4.22–5.81)
RDW: 14.1 % (ref 11.5–15.5)
WBC: 11.3 10*3/uL — AB (ref 4.0–10.5)

## 2018-04-11 LAB — RAPID URINE DRUG SCREEN, HOSP PERFORMED
Amphetamines: NOT DETECTED
BARBITURATES: NOT DETECTED
Benzodiazepines: NOT DETECTED
Cocaine: POSITIVE — AB
OPIATES: NOT DETECTED
TETRAHYDROCANNABINOL: POSITIVE — AB

## 2018-04-11 LAB — LIPASE, BLOOD: Lipase: 36 U/L (ref 11–51)

## 2018-04-11 MED ORDER — ONDANSETRON HCL 4 MG PO TABS: 4.0000 mg | ORAL_TABLET | Freq: Four times a day (QID) | ORAL | 0 refills | Status: DC

## 2018-04-11 MED ORDER — ONDANSETRON 4 MG PO TBDP
8.0000 mg | ORAL_TABLET | Freq: Once | ORAL | Status: AC
  Administered 2018-04-11: 8 mg via ORAL
  Filled 2018-04-11: qty 2

## 2018-04-11 NOTE — ED Notes (Signed)
Pt trying to leave AMA. MD Charm Barges alerted and is at bedside explaining to pt that he needs to stay for further evaluation. Pt said he needs to catch the bus and is leaving.

## 2018-04-11 NOTE — ED Triage Notes (Signed)
Patient complains of emesis x4 days. States he has been unable to keep fluid and food down. Denies fevers. Denies pain.

## 2018-04-11 NOTE — Discharge Instructions (Addendum)
Your evaluated in the emergency department for vomiting and diarrhea.  We are still waiting on the results of your lithium level but you were unwilling to stay any longer.  You should follow-up with your doctor and return if any worsening symptoms.

## 2018-04-11 NOTE — ED Notes (Signed)
Pt reports history of Bell's Palsey.  "I get it every other year or so."

## 2018-04-11 NOTE — ED Notes (Signed)
ED Provider at bedside. 

## 2018-04-11 NOTE — ED Notes (Signed)
Pt given food per RN. 

## 2018-04-11 NOTE — ED Provider Notes (Signed)
Patient placed in Quick Look pathway, seen and evaluated   Chief Complaint: Emesis  HPI: 57 year old male presents with vomiting.  States he has been vomiting approximately 4 times a day for the past 4 to 5 days.  Initially had vomiting and diarrhea however the diarrhea has resolved.  He thought his symptoms may have been related to cigarettes. He denies fever, chills, headache, chest pain, shortness of breath, abdominal pain, urinary symptoms.  He endorses marijuana use but denies any other drug or alcohol use.  Past surgical history is remarkable for a cholecystectomy and appendectomy.   ROS: +emesis, +diarrhea (resolved)  -fever, -abdominal pain   Physical Exam:   Gen: No distress  Neuro: Awake and Alert. Cooperative  Skin: Warm    Focused Exam: Cardiac: Regular rate and rhythm    Lungs: CTA    Abdomen: Soft, non-tender.  Plan: CBC, CMP, lipase, UA, UDS, Lithium. Zofran.  Initiation of care has begun. The patient has been counseled on the process, plan, and necessity for staying for the completion/evaluation, and the remainder of the medical screening examination    Bethel Born, PA-C 04/11/18 1327    Gerhard Munch, MD 04/12/18 1850

## 2018-05-19 ENCOUNTER — Other Ambulatory Visit: Payer: Self-pay

## 2018-05-19 ENCOUNTER — Emergency Department (HOSPITAL_COMMUNITY)
Admission: EM | Admit: 2018-05-19 | Discharge: 2018-05-19 | Disposition: A | Discharge status: Home / Self Care | Payer: Medicare HMO | Attending: Emergency Medicine | Admitting: Emergency Medicine

## 2018-05-19 ENCOUNTER — Encounter (HOSPITAL_COMMUNITY): Payer: Self-pay | Admitting: *Deleted

## 2018-05-19 DIAGNOSIS — J449 Chronic obstructive pulmonary disease, unspecified: Secondary | ICD-10-CM | POA: Diagnosis not present

## 2018-05-19 DIAGNOSIS — F17210 Nicotine dependence, cigarettes, uncomplicated: Secondary | ICD-10-CM | POA: Diagnosis not present

## 2018-05-19 DIAGNOSIS — H9201 Otalgia, right ear: Secondary | ICD-10-CM | POA: Diagnosis present

## 2018-05-19 DIAGNOSIS — H938X1 Other specified disorders of right ear: Secondary | ICD-10-CM | POA: Diagnosis not present

## 2018-05-19 DIAGNOSIS — H6591 Unspecified nonsuppurative otitis media, right ear: Secondary | ICD-10-CM

## 2018-05-19 MED ORDER — OXYMETAZOLINE HCL 0.05 % NA SOLN: 1.0000 | Freq: Two times a day (BID) | NASAL | 0 refills | Status: DC

## 2018-05-19 MED ORDER — FLUTICASONE PROPIONATE 50 MCG/ACT NA SUSP: 1.0000 | Freq: Every day | NASAL | 1 refills | Status: DC

## 2018-05-19 MED ORDER — LORATADINE 10 MG PO TABS: 10.0000 mg | ORAL_TABLET | Freq: Every day | ORAL | 1 refills | Status: AC

## 2018-05-19 NOTE — ED Provider Notes (Signed)
[facility]   CSN: [id number] Arrival date & time: 05/19/18  1626     History   Chief Complaint Chief Complaint  Patient presents with  . Otalgia    HPI Anthony [name] is a 57 y.o. male.  HPI   Patient is a 57 y.o. male with a history of bipolar disorder, seizures, depression, COPD, GERD, polysubstance use presenting for sensation of right ear fullness.  Patient reports that over the past 48 hours, he has had a sensation that there is fluid in the right ear.  Patient reporting that he also has some increasing pain in the right ear.  Patient reports he thought some dark drainage came out of his ear today.  Patient denies any fevers, chills.   Patient denies any sore throat, cough. Patient does report that he has chronic congestion due to allergic rhinitis and has been out of his medications.  No remedies attempted for symptoms.  Past Medical History:  Diagnosis Date  . Bell's palsy   . Cannabis abuse   . Cellulitis of right leg 04/2017  . Chronic left shoulder pain   . Depression   . Encephalitis   . Episodic mood disorder (HCC)   . Homelessness   . Post traumatic stress disorder   . Seizures (HCC)    last seizure 12 yrs ago  . Sepsis (HCC) 04/2017    Patient Active Problem List   Diagnosis Date Noted  . COPD (chronic obstructive pulmonary disease) (HCC) 09/13/2017  . GERD (gastroesophageal reflux disease) 09/13/2017  . Cannabis use disorder, moderate, dependence (HCC) 09/12/2017  . Tobacco use disorder 09/12/2017  . Cocaine use disorder, severe, dependence (HCC) 01/03/2017  . History of encephalitis 12/10/2016  . Bipolar I disorder, most recent episode depressed, severe without psychotic features (HCC) 12/09/2016  . Allergic reaction   . Rash   . Suicidal ideation   . PTSD (post-traumatic stress disorder) 09/12/2014  . Episodic mood disorder (HCC) 09/11/2014    Past Surgical History:  Procedure Laterality  Date  . APPENDECTOMY    . brain damage    . CHOLECYSTECTOMY    . FOOT SURGERY Bilateral   . KNEE SURGERY Right         Home Medications    Prior to Admission medications   Medication Sig Start Date End Date Taking? Authorizing Provider  albuterol (PROVENTIL HFA;VENTOLIN HFA) 108 (90 Base) MCG/ACT inhaler Inhale 1 puff into the lungs every 6 (six) hours as needed for wheezing or shortness of breath. Patient not taking: Reported on 01/06/2018 09/16/17   Pucilowska, Ellin GoodieJolanta B, MD  fluticasone (FLONASE) 50 MCG/ACT nasal spray Place 1 spray into both nostrils daily. Patient not taking: Reported on 01/06/2018 09/16/17   Pucilowska, Braulio ConteJolanta B, MD  gabapentin (NEURONTIN) 600 MG tablet Take 0.5 tablets (300 mg total) by mouth 3 (three) times daily. Patient not taking: Reported on 01/06/2018 09/16/17   Pucilowska, Braulio ConteJolanta B, MD  hydrOXYzine (ATARAX/VISTARIL) 50 MG tablet Take 1 tablet (50 mg total) by mouth 3 (three) times daily. Patient not taking: Reported on 01/06/2018 09/16/17   Pucilowska, Braulio ConteJolanta B, MD  lithium carbonate (LITHOBID) 300 MG CR tablet Take 2 tablets (600 mg total) by mouth every 12 (twelve) hours. Patient not taking: Reported on 01/06/2018 09/16/17   Pucilowska, Braulio ConteJolanta B, MD  loratadine (CLARITIN) 10 MG tablet Take 1 tablet (10 mg total) by mouth daily. Patient not taking: Reported on 01/06/2018 09/16/17   Shari ProwsPucilowska, Jolanta B, MD  ondansetron (  ZOFRAN) 4 MG tablet Take 1 tablet (4 mg total) by mouth every 6 (six) hours. 04/11/18   Terrilee Files, MD  pantoprazole (PROTONIX) 40 MG tablet Take 1 tablet (40 mg total) by mouth daily. Patient not taking: Reported on 01/06/2018 09/16/17   Pucilowska, Braulio Conte B, MD  QUEtiapine (SEROQUEL) 50 MG tablet Take 1 tablet (50 mg total) by mouth at bedtime. Patient not taking: Reported on 01/06/2018 09/16/17   Shari Prows, MD  venlafaxine XR (EFFEXOR-XR) 75 MG 24 hr capsule Take 3 capsules (225 mg total) by mouth daily with  breakfast. Patient not taking: Reported on 01/06/2018 09/16/17   Shari Prows, MD    Family History Family History  Problem Relation Age of Onset  . Cancer Mother   . Heart attack Father   . Mental illness Sister   . Alcoholism Brother     Social History Social History   Tobacco Use  . Smoking status: Current Every Day Smoker    Packs/day: 1.00    Years: 38.00    Pack years: 38.00    Types: Cigarettes  . Smokeless tobacco: Never Used  Substance Use Topics  . Alcohol use: No  . Drug use: Yes    Frequency: 2.0 times per week    Types: Marijuana    Comment: Denies but has a history.      Allergies   Nuedexta [dextromethorphan-quinidine]   Review of Systems Review of Systems  HENT: Positive for congestion, ear discharge, ear pain and rhinorrhea. Negative for sore throat, trouble swallowing and voice change.   Respiratory: Negative for cough and wheezing.    Physical Exam Updated Vital Signs BP 131/87 (BP Location: Right Arm)   Pulse (!) 103   Temp 98.8 F (37.1 C) (Oral)   Resp 17   Ht 5\' 9"  (1.753 m)   Wt 95.3 kg (210 lb)   SpO2 99%   BMI 31.01 kg/m   Physical Exam  Constitutional: He appears well-developed and well-nourished. No distress.  Sitting comfortably in bed.  HENT:  Head: Normocephalic and atraumatic.  No tenderness to palpation of bilateral mastoid, tragus, pinna, or auricles.   Right EAC without edema or drainage.  Right tympanic membrane with serous effusion but no evidence of suppuration. No erythema.  Left EAC without edema or drainage.  No effusion of left tympanic membrane.  Eyes: Conjunctivae are normal. Right eye exhibits no discharge. Left eye exhibits no discharge.  EOMs normal to gross examination.  Neck: Normal range of motion.  Cardiovascular: Normal rate, regular rhythm and normal heart sounds.  Pulmonary/Chest: Effort normal. He has wheezes.  Normal respiratory effort. Patient converses comfortably. Soft wheezes in  bilateral lung bases.   Abdominal: He exhibits no distension.  Musculoskeletal: Normal range of motion.  Neurological: He is alert.  Cranial nerves intact to gross observation. Patient moves extremities without difficulty.  Skin: Skin is warm and dry. He is not diaphoretic.  Psychiatric: He has a normal mood and affect. His behavior is normal. Judgment and thought content normal.  Nursing note and vitals reviewed.    ED Treatments / Results  Labs (all labs ordered are listed, but only abnormal results are displayed) Labs Reviewed - No data to display  EKG None  Radiology No results found.  Procedures Procedures (including critical care time)  Medications Ordered in ED Medications - No data to display   Initial Impression / Assessment and Plan / ED Course  I have reviewed the triage vital signs and  the nursing notes.  Pertinent labs & imaging results that were available during my care of the patient were reviewed by me and considered in my medical decision making (see chart for details).     Patient nontoxic-appearing, afebrile, no acute distress.  Patient exhibits a serous effusion of the right ear, likely accounting for his sensation of ear fullness.  There is no evidence of infection in the right ear, mastoiditis, or otitis externa.  Will restart patient on Flonase, and instructed patient to take oxymetazoline for 3 days.  Patient was counseled not to take more than 3 days, as he will have rebound congestion.  Also restarted patient on antihistamine therapy.  Patient can return precautions for any worsening pain, drainage from the ear, or fever or chills.  Of note, patient also had soft wheezes on exam, and confirm that he has his inhaler prescriptions.  Patient is in understanding and agrees with the plan of care.  Final Clinical Impressions(s) / ED Diagnoses   Final diagnoses:  Sensation of fullness in right ear  Fluid level behind tympanic membrane of right ear     ED Discharge Orders        Ordered    fluticasone (FLONASE) 50 MCG/ACT nasal spray  Daily     05/19/18 1834    oxymetazoline (AFRIN NASAL SPRAY) 0.05 % nasal spray  2 times daily     05/19/18 1834    loratadine (CLARITIN) 10 MG tablet  Daily     05/19/18 1834       Elisha Ponder, PA-C 05/20/18 0257    Rolland Porter, MD 05/20/18 [phone]

## 2018-05-19 NOTE — ED Notes (Signed)
No answer for a room

## 2018-05-19 NOTE — ED Notes (Signed)
Patient verbalized understanding of discharge instructions and denies any further needs or questions at this time. VS stable. Patient ambulatory with steady gait using cane.

## 2018-05-19 NOTE — ED Triage Notes (Signed)
Rt ear draining and sle pain 2 days ago  He is unable to hear from his rt ear

## 2018-05-19 NOTE — Discharge Instructions (Signed)
Your examination is suggestive of fluid in the middle ear.  It does not appear infected.  This can often happen when you have allergies that clogged up with the passage between your ear and your nose.  Please resume Flonase and Claritin.  I will also add on oxymetazoline or Afrin.  This medication can be taken twice a day for 3 days only.  Do not persist beyond 3 days or else you could have worsening congestion.  Please return to the emergency department for any cough, worsening ear pain, or fevers with your symptoms.   Thank you for allowing us to participate in your care today.

## 2019-03-28 ENCOUNTER — Encounter (HOSPITAL_COMMUNITY): Payer: Self-pay

## 2019-03-28 ENCOUNTER — Other Ambulatory Visit: Payer: Self-pay

## 2019-03-28 ENCOUNTER — Inpatient Hospital Stay (HOSPITAL_COMMUNITY)
Admission: RE | Admit: 2019-03-28 | Discharge: 2019-04-02 | DRG: 885 | Disposition: A | Discharge status: Home / Self Care | Payer: Medicare HMO | Attending: Psychiatry | Admitting: Psychiatry

## 2019-03-28 DIAGNOSIS — F3130 Bipolar disorder, current episode depressed, mild or moderate severity, unspecified: Secondary | ICD-10-CM | POA: Diagnosis present

## 2019-03-28 DIAGNOSIS — F4310 Post-traumatic stress disorder, unspecified: Secondary | ICD-10-CM | POA: Diagnosis present

## 2019-03-28 DIAGNOSIS — Z818 Family history of other mental and behavioral disorders: Secondary | ICD-10-CM | POA: Diagnosis not present

## 2019-03-28 DIAGNOSIS — F322 Major depressive disorder, single episode, severe without psychotic features: Secondary | ICD-10-CM

## 2019-03-28 DIAGNOSIS — Z915 Personal history of self-harm: Secondary | ICD-10-CM

## 2019-03-28 DIAGNOSIS — F1011 Alcohol abuse, in remission: Secondary | ICD-10-CM | POA: Diagnosis present

## 2019-03-28 DIAGNOSIS — Z811 Family history of alcohol abuse and dependence: Secondary | ICD-10-CM | POA: Diagnosis not present

## 2019-03-28 DIAGNOSIS — Z8661 Personal history of infections of the central nervous system: Secondary | ICD-10-CM

## 2019-03-28 DIAGNOSIS — F314 Bipolar disorder, current episode depressed, severe, without psychotic features: Secondary | ICD-10-CM | POA: Diagnosis present

## 2019-03-28 DIAGNOSIS — Z8249 Family history of ischemic heart disease and other diseases of the circulatory system: Secondary | ICD-10-CM | POA: Diagnosis not present

## 2019-03-28 DIAGNOSIS — J449 Chronic obstructive pulmonary disease, unspecified: Secondary | ICD-10-CM | POA: Diagnosis present

## 2019-03-28 DIAGNOSIS — F1220 Cannabis dependence, uncomplicated: Secondary | ICD-10-CM | POA: Diagnosis present

## 2019-03-28 DIAGNOSIS — F1410 Cocaine abuse, uncomplicated: Secondary | ICD-10-CM | POA: Diagnosis present

## 2019-03-28 DIAGNOSIS — Z23 Encounter for immunization: Secondary | ICD-10-CM | POA: Diagnosis present

## 2019-03-28 DIAGNOSIS — R42 Dizziness and giddiness: Secondary | ICD-10-CM | POA: Diagnosis not present

## 2019-03-28 DIAGNOSIS — Z79899 Other long term (current) drug therapy: Secondary | ICD-10-CM | POA: Diagnosis not present

## 2019-03-28 DIAGNOSIS — G8194 Hemiplegia, unspecified affecting left nondominant side: Secondary | ICD-10-CM | POA: Diagnosis present

## 2019-03-28 DIAGNOSIS — Z888 Allergy status to other drugs, medicaments and biological substances status: Secondary | ICD-10-CM

## 2019-03-28 DIAGNOSIS — R45851 Suicidal ideations: Secondary | ICD-10-CM | POA: Diagnosis present

## 2019-03-28 DIAGNOSIS — F329 Major depressive disorder, single episode, unspecified: Secondary | ICD-10-CM | POA: Diagnosis present

## 2019-03-28 DIAGNOSIS — F1994 Other psychoactive substance use, unspecified with psychoactive substance-induced mood disorder: Secondary | ICD-10-CM | POA: Diagnosis present

## 2019-03-28 DIAGNOSIS — F3132 Bipolar disorder, current episode depressed, moderate: Secondary | ICD-10-CM

## 2019-03-28 MED ORDER — ACETAMINOPHEN 325 MG PO TABS
650.0000 mg | ORAL_TABLET | Freq: Four times a day (QID) | ORAL | Status: DC | PRN
  Administered 2019-03-30 – 2019-04-01 (×3): 650 mg via ORAL
  Filled 2019-03-28 (×3): qty 2

## 2019-03-28 MED ORDER — MAGNESIUM HYDROXIDE 400 MG/5ML PO SUSP: 30.0000 mL | Freq: Every day | ORAL | Status: DC | PRN

## 2019-03-28 MED ORDER — PNEUMOCOCCAL VAC POLYVALENT 25 MCG/0.5ML IJ INJ
0.5000 mL | INJECTION | INTRAMUSCULAR | Status: AC
  Administered 2019-03-29: 0.5 mL via INTRAMUSCULAR

## 2019-03-28 MED ORDER — NICOTINE 21 MG/24HR TD PT24
21.0000 mg | MEDICATED_PATCH | Freq: Every day | TRANSDERMAL | Status: DC
  Administered 2019-03-28 – 2019-04-02 (×6): 21 mg via TRANSDERMAL
  Filled 2019-03-28 (×8): qty 1

## 2019-03-28 MED ORDER — ALUM & MAG HYDROXIDE-SIMETH 200-200-20 MG/5ML PO SUSP
30.0000 mL | ORAL | Status: DC | PRN
Start: 2019-03-28 — End: 2019-04-02
  Administered 2019-03-29: 17:00:00 30 mL via ORAL
  Filled 2019-03-28: qty 30

## 2019-03-28 MED ORDER — QUETIAPINE FUMARATE 50 MG PO TABS
50.0000 mg | ORAL_TABLET | Freq: Every evening | ORAL | Status: DC | PRN
  Administered 2019-03-28: 23:00:00 50 mg via ORAL
  Filled 2019-03-28 (×7): qty 1

## 2019-03-28 NOTE — Tx Team (Signed)
Initial Treatment Plan 03/28/2019 2:32 PM Anthony [name] [id number]    PATIENT STRESSORS: Financial difficulties Health problems Medication change or noncompliance   PATIENT STRENGTHS: Ability for insight Capable of independent living Motivation for treatment/growth Physical Health Supportive family/friends   PATIENT IDENTIFIED PROBLEMS: "My meds aren't working"  "my depression is worse"  "working on Pharmacologist"                 DISCHARGE CRITERIA:  Ability to meet basic life and health needs Adequate post-discharge living arrangements Improved stabilization in mood, thinking, and/or behavior Motivation to continue treatment in a less acute level of care  PRELIMINARY DISCHARGE PLAN: Attend aftercare/continuing care group Attend PHP/IOP Return to previous living arrangement  PATIENT/FAMILY INVOLVEMENT: This treatment plan has been presented to and reviewed with the patient, Anthony [name].  The patient and family have been given the opportunity to ask questions and make suggestions.  Raylene Miyamoto, RN 03/28/2019, 2:32 PM
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
## 2019-03-28 NOTE — [facility] Group Notes (Signed)
Adult Psychoeducational Group Note  Date:  03/28/2019 Time:  9:37 PM  Group Topic/Focus:  Wrap-Up Group:   The focus of this group is to help patients review their daily goal of treatment and discuss progress on daily workbooks.  Participation Level:  Active  Participation Quality:  Appropriate and Attentive  Affect:  Appropriate  Cognitive:  Alert and Appropriate  Insight: Appropriate and Good  Engagement in Group:  Engaged  Modes of Intervention:  Discussion and Education  Additional Comments:  Pt attended and participated in wrap up group this evening. Pt day started off bad, but as the day passed, it has gotten better. Pt completed their goal, which was to make the best of each day.   Chrisandra Netters 03/28/2019, 9:37 PM

## 2019-03-28 NOTE — H&P (Signed)
Behavioral Health Medical Screening Exam  Anthony [name] is an 58 y.o. male.who presents as a walk-in for psychiatric evaluation. He endorses active suicidal ideations with a plan to, " cut my throat or walk in front of a bus." He identifies acute stressors as financial difficulties. He is unemployed and currently receiving disability. He lives alone. He reports a history of MDD, PTSD, and Bipolar disorder. He describes current symptoms of depression as feelings of hopelessness, worthlessness, lack of motivation, and decreased sleep. Denies any mania. Denies symptoms of PTSD. Denies auditory or visual hallucinations, paranoid thoughts or delusions. Reports 2 prior suicide attempts and at least two psychiatric hospitalizations in the past one, 1.5 years ago at [facility] and one, 5 years ago here at [facility]. He currently receives outpatient services through [facility]. Psychotropic medications reported as Seroquel and Latuda which he believes are not helpful. He has been on these medications for over a year per his report. He admits to smoking mariajuana every other day although denies other substance abuse or use.   Total Time spent with patient: 15 minutes  Psychiatric Specialty Exam: Physical Exam  Nursing note and vitals reviewed. Constitutional: He is oriented to person, place, and time.  Neurological: He is alert and oriented to person, place, and time.    Review of Systems  Psychiatric/Behavioral: Positive for depression and suicidal ideas. Negative for hallucinations, memory loss and substance abuse. The patient has insomnia. The patient is not nervous/anxious.   All other systems reviewed and are negative.   Blood pressure 128/89, pulse 70, temperature 98.1 F (36.7 C), temperature source Oral, resp. rate 18, SpO2 97 %.There is no height or weight on file to calculate BMI.  General Appearance: Casual  Eye Contact:  Good  Speech:  Clear and Coherent and Normal  Rate  Volume:  Normal  Mood:  Depressed  Affect:  Depressed  Thought Process:  Coherent, Goal Directed, Linear and Descriptions of Associations: Intact  Orientation:  Full (Time, Place, and Person)  Thought Content:  Logical  Suicidal Thoughts:  Yes.  with intent/plan  Homicidal Thoughts:  No  Memory:  Immediate;   Fair Recent;   Fair Remote;   Fair  Judgement:  Impaired  Insight:  Fair  Psychomotor Activity:  Normal  Concentration: Concentration: Fair and Attention Span: Fair  Recall:  [employer] of Knowledge:Fair  Language: Good  Akathisia:  Negative  Handed:  Right  AIMS (if indicated):     Assets:  Communication Skills Desire for Improvement Resilience Social Support  Sleep:       Musculoskeletal: Strength & Muscle Tone: within normal limits Gait & Station: normal Patient leans: N/A  Blood pressure 128/89, pulse 70, temperature 98.1 F (36.7 C), temperature source Oral, resp. rate 18, SpO2 97 %.  Recommendations:  Based on my evaluation the patient does not appear to have an emergency medical condition. Based off this assessment, I am recommending inpatient psychiatric hospitalization for stabilization and safety.   Denzil Magnuson, NP 03/28/2019, 12:22 PM
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
## 2019-03-28 NOTE — Progress Notes (Signed)
   03/28/19 2352  COVID-19 Daily Checkoff  Have you had a fever (temp > 37.80C/100F)  in the past 24 hours?  No  COVID-19 EXPOSURE  Have you traveled outside the state in the past 14 days? No  Have you been in contact with someone with a confirmed diagnosis of COVID-19 or PUI in the past 14 days without wearing appropriate PPE? No  Have you been living in the same home as a person with confirmed diagnosis of COVID-19 or a PUI (household contact)? No  Have you been diagnosed with COVID-19? No

## 2019-03-28 NOTE — BH Assessment (Signed)
Patient meets criteria for a [facility] inpatient admission. The accepting provider is [place], NP. The attending provider is Dr. Jama Flavors. Room #403-1.

## 2019-03-29 DIAGNOSIS — F1994 Other psychoactive substance use, unspecified with psychoactive substance-induced mood disorder: Secondary | ICD-10-CM

## 2019-03-29 DIAGNOSIS — F3132 Bipolar disorder, current episode depressed, moderate: Secondary | ICD-10-CM

## 2019-03-29 LAB — COMPREHENSIVE METABOLIC PANEL
ALT: 31 U/L (ref 0–44)
AST: 25 U/L (ref 15–41)
Albumin: 4.1 g/dL (ref 3.5–5.0)
Alkaline Phosphatase: 65 U/L (ref 38–126)
Anion gap: 10 (ref 5–15)
BUN: 25 mg/dL — ABNORMAL HIGH (ref 6–20)
CO2: 18 mmol/L — ABNORMAL LOW (ref 22–32)
Calcium: 8.7 mg/dL — ABNORMAL LOW (ref 8.9–10.3)
Chloride: 113 mmol/L — ABNORMAL HIGH (ref 98–111)
Creatinine, Ser: 1.06 mg/dL (ref 0.61–1.24)
GFR calc Af Amer: >60 mL/min (ref >60)
GFR calc non Af Amer: >60 mL/min (ref >60)
Glucose, Bld: 94 mg/dL (ref 70–99)
Potassium: 4.1 mmol/L (ref 3.5–5.1)
Sodium: 141 mmol/L (ref 135–145)
Total Bilirubin: 0.5 mg/dL (ref 0.3–1.2)
Total Protein: 7 g/dL (ref 6.5–8.1)

## 2019-03-29 LAB — ETHANOL: Alcohol, Ethyl (B): <10 mg/dL (ref <10)

## 2019-03-29 LAB — RAPID URINE DRUG SCREEN, HOSP PERFORMED
Amphetamines: NOT DETECTED
Barbiturates: NOT DETECTED
Benzodiazepines: NOT DETECTED
Cocaine: POSITIVE — AB
Opiates: NOT DETECTED
Tetrahydrocannabinol: POSITIVE — AB

## 2019-03-29 LAB — CBC
HCT: 48.8 % (ref 39.0–52.0)
Hemoglobin: 16.1 g/dL (ref 13.0–17.0)
MCH: 29.7 pg (ref 26.0–34.0)
MCHC: 33 g/dL (ref 30.0–36.0)
MCV: 90 fL (ref 80.0–100.0)
Platelets: 254 10*3/uL (ref 150–400)
RBC: 5.42 MIL/uL (ref 4.22–5.81)
RDW: 14.2 % (ref 11.5–15.5)
WBC: 11.1 10*3/uL — ABNORMAL HIGH (ref 4.0–10.5)
nRBC: 0 % (ref 0.0–0.2)

## 2019-03-29 LAB — LIPID PANEL
Cholesterol: 144 mg/dL (ref 0–200)
HDL: 59 mg/dL (ref >40)
LDL Cholesterol: 80 mg/dL (ref 0–99)
Total CHOL/HDL Ratio: 2.4 RATIO
Triglycerides: 26 mg/dL (ref <150)
VLDL: 5 mg/dL (ref 0–40)

## 2019-03-29 LAB — TSH: TSH: 2.426 u[IU]/mL (ref 0.350–4.500)

## 2019-03-29 MED ORDER — QUETIAPINE FUMARATE 50 MG PO TABS
50.0000 mg | ORAL_TABLET | Freq: Every day | ORAL | Status: DC
  Administered 2019-03-29: 50 mg via ORAL
  Filled 2019-03-29 (×3): qty 1

## 2019-03-29 MED ORDER — ARIPIPRAZOLE 5 MG PO TABS
5.0000 mg | ORAL_TABLET | Freq: Every day | ORAL | Status: DC
  Administered 2019-03-30: 5 mg via ORAL
  Filled 2019-03-29 (×3): qty 1

## 2019-03-29 MED ORDER — LORAZEPAM 0.5 MG PO TABS: 0.5000 mg | ORAL_TABLET | Freq: Four times a day (QID) | ORAL | Status: DC | PRN
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
## 2019-03-29 NOTE — H&P (Addendum)
Psychiatric Admission Assessment Adult  Patient Identification: Claudia Alvizo MRN:  [id number] Date of Evaluation:  03/29/2019 Chief Complaint:  Bipolar 1 Disorder, Most Recent Episode: Manic Principal Diagnosis: MDD (major depressive disorder) Diagnosis:  Principal Problem:   MDD (major depressive disorder) Active Problems:   Moderate bipolar I disorder, most recent episode depressed (HCC)   Substance induced mood disorder ([place])  History of Present Illness: Mr. Nabers is a 58 year old male with history of viral encephalitis with left sided hemiparesis, depression, anxiety, PTSD, and alcohol/cocaine use presenting for treatment of depression with suicidal ideation. From MD's admission SRA: Presented due to worsening depression over the last 1 to 2 weeks.  Attributes worsening mood in part to father's death anniversary last week.  He describes neurovegetative symptoms of depression including poor sleep, low energy, anhedonia, which he describes as mild.  He reports recent suicidal ideations, with thoughts of cutting his throat. Marland Kitchen He also reports he has been upset/angry at a peer at [employer] "he keeps on asking for my pipe  and he will not  stop ".  He states he was having homicidal thoughts towards this person ( named [name]), but denies any homicidal ideations at this time. Patient reports  history of bipolar disorder diagnoses.  He also states he has been diagnosed with PTSD stemming from history of combat experiences when young.  He described Seroquel and the [place].  He feels that the Seroquel (which she has been on for several years) has been helpful but he does not feel the Anette Guarneri is helping much and would prefer to change his medication.  States he has been on several different medications in the past, remembers Abilify is helpful.  Took it (in combination with Seroquel) for about 1 to 2 years in the past, does not member having had side effects. He describes a history of cocaine and cannabis  abuse.  States he uses cocaine regularly, last used about 2 weeks ago.  Uses cannabis almost every day.   Associated Signs/Symptoms: Depression Symptoms:  depressed mood, fatigue, difficulty concentrating, suicidal thoughts with specific plan, anxiety, (Hypo) Manic Symptoms:  Irritable Mood, Anxiety Symptoms:  Excessive Worry, Psychotic Symptoms:  denies PTSD Symptoms: History of PTSD from [place] in [place]. He reports nightmares/flashbacks have improved over time. Total Time spent with patient: 45 minutes  Past Psychiatric History: History of multiple hospitalizations, most recently at [facility] ~18 months ago for SI. One suicide attempt via overdose on Ambien about 15 years ago. Remote history of alcohol abuse but denies any alcohol use over the last 20 years. Denies history of self-injurious behaviors, mania, psychosis or panic attacks.   Is the patient at risk to self? Yes.    Has the patient been a risk to self in the past 6 months? No.  Has the patient been a risk to self within the distant past? Yes.    Is the patient a risk to others? No.  Has the patient been a risk to others in the past 6 months? No.  Has the patient been a risk to others within the distant past? No.   Prior Inpatient Therapy: Prior Inpatient Therapy: Yes Prior Therapy Dates: 2015 Prior Therapy Facilty/Provider(s): [facility] Reason for Treatment: bipolar disorder Prior Outpatient Therapy: Prior Outpatient Therapy: Yes Prior Therapy Dates: ongoing Prior Therapy Facilty/Provider(s): [place] Reason for Treatment: med management, PSE Does patient have an ACCT team?: No Does patient have Intensive In-House Services?  : No Does patient  have [facility] services? : No Does patient have P4CC services?: No  Alcohol Screening: 1. How often do you have a drink containing alcohol?: Never 2. How many drinks containing alcohol do you have on a typical day when you are drinking?:  1 or 2 3. How often do you have six or more drinks on one occasion?: Never AUDIT-C Score: 0 4. How often during the last year have you found that you were not able to stop drinking once you had started?: Never 5. How often during the last year have you failed to do what was normally expected from you becasue of drinking?: Never 6. How often during the last year have you needed a first drink in the morning to get yourself going after a heavy drinking session?: Never 7. How often during the last year have you had a feeling of guilt of remorse after drinking?: Never 8. How often during the last year have you been unable to remember what happened the night before because you had been drinking?: Never 9. Have you or someone else been injured as a result of your drinking?: No 10. Has a relative or friend or a doctor or another health worker been concerned about your drinking or suggested you cut down?: No Alcohol Use Disorder Identification Test Final Score (AUDIT): 0 Alcohol Brief Interventions/Follow-up: AUDIT Score <7 follow-up not indicated Substance Abuse History in the last 12 months:  Yes.  Reports cocaine use two weeks ago. Consequences of Substance Abuse: denies Previous Psychotropic Medications: Yes  Psychological Evaluations: No  Past Medical History:  Past Medical History:  Diagnosis Date  . Bell's palsy   . Cannabis abuse   . Cellulitis of right leg 04/2017  . Chronic left shoulder pain   . Depression   . Encephalitis   . Episodic mood disorder ([place])   . Homelessness   . Post traumatic stress disorder   . Seizures ([place])    last seizure 12 yrs ago  . Sepsis ([place]) 04/2017    Past Surgical History:  Procedure Laterality Date  . APPENDECTOMY    . brain damage    . CHOLECYSTECTOMY    . FOOT SURGERY Bilateral   . KNEE SURGERY Right    Family History:  Family History  Problem Relation Age of Onset  . Cancer Mother   . Heart attack Father   . Mental illness Sister   .  Alcoholism Brother    Family Psychiatric  History: Brother with bipolar disorder, PTSD and alcohol use disorder. Sister with schizophrenia. Uncle with alcohol use disorder. Tobacco Screening:   Social History:  Social History   Substance and Sexual Activity  Alcohol Use No     Social History   Substance and Sexual Activity  Drug Use Yes  . Frequency: 2.0 times per week  . Types: Marijuana   Comment: Denies but has a history.     Additional Social History: Marital status: Single    Pain Medications: see MAR Prescriptions: see MAR Over the Counter: see MAR History of alcohol / drug use?: No history of alcohol / drug abuse                    Allergies:   Allergies  Allergen Reactions  . Nuedexta [Dextromethorphan-Quinidine] Other (See Comments)    hallucinations   Lab Results:  Results for orders placed or performed during the hospital encounter of 03/28/19 (from the past 48 hour(s))  Urine rapid drug screen (hosp performed)not at [facility]  Status: Abnormal   Collection Time: 03/28/19  7:00 PM  Result Value Ref Range   Opiates NONE DETECTED NONE DETECTED   Cocaine POSITIVE (A) NONE DETECTED   Benzodiazepines NONE DETECTED NONE DETECTED   Amphetamines NONE DETECTED NONE DETECTED   Tetrahydrocannabinol POSITIVE (A) NONE DETECTED   Barbiturates NONE DETECTED NONE DETECTED    Comment: (NOTE) DRUG SCREEN FOR MEDICAL PURPOSES ONLY.  IF CONFIRMATION IS NEEDED FOR ANY PURPOSE, NOTIFY LAB WITHIN 5 DAYS. LOWEST DETECTABLE LIMITS FOR URINE DRUG SCREEN Drug Class                     Cutoff (ng/mL) Amphetamine and metabolites    1000 Barbiturate and metabolites    200 Benzodiazepine                 179 Tricyclics and metabolites     300 Opiates and metabolites        300 Cocaine and metabolites        300 THC                            50 Performed at [facility], [place] [address] [place], [place] [address]   Comprehensive metabolic panel      Status: Abnormal   Collection Time: 03/29/19  6:56 AM  Result Value Ref Range   Sodium 141 135 - 145 mmol/L   Potassium 4.1 3.5 - 5.1 mmol/L   Chloride 113 (H) 98 - 111 mmol/L   CO2 18 (L) 22 - 32 mmol/L   Glucose, Bld 94 70 - 99 mg/dL   BUN 25 (H) 6 - 20 mg/dL   Creatinine, Ser 1.06 0.61 - 1.24 mg/dL   Calcium 8.7 (L) 8.9 - 10.3 mg/dL   Total Protein 7.0 6.5 - 8.1 g/dL   Albumin 4.1 3.5 - 5.0 g/dL   AST 25 15 - 41 U/L   ALT 31 0 - 44 U/L   Alkaline Phosphatase 65 38 - 126 U/L   Total Bilirubin 0.5 0.3 - 1.2 mg/dL   GFR calc non Af Amer >60 >60 mL/min   GFR calc Af Amer >60 >60 mL/min   Anion gap 10 5 - 15    Comment: Performed at [facility], [place] [address] [place], [place] [address]  Ethanol     Status: None   Collection Time: 03/29/19  6:56 AM  Result Value Ref Range   Alcohol, Ethyl (B) <10 <10 mg/dL    Comment: (NOTE) Lowest detectable limit for serum alcohol is 10 mg/dL. For medical purposes only. Performed at [facility], [place] [address] [place], [place] [address]   Lipid panel     Status: None   Collection Time: 03/29/19  6:56 AM  Result Value Ref Range   Cholesterol 144 0 - 200 mg/dL    Comment:        ATP III CLASSIFICATION:  <200     mg/dL   Desirable  200-239  mg/dL   Borderline High  >=240    mg/dL   High           Triglycerides 26 <150 mg/dL   HDL 59 >40 mg/dL   Total CHOL/HDL Ratio 2.4 RATIO   VLDL 5 0 - 40 mg/dL   LDL Cholesterol 80 0 - 99 mg/dL    Comment:        Total Cholesterol/HDL:CHD Risk Coronary Heart Disease Risk  Table                     Men   Women  1/2 Average Risk   3.4   3.3  Average Risk       5.0   4.4  2 X Average Risk   9.6   7.1  3 X Average Risk  23.4   11.0        Use the calculated Patient Ratio above and the CHD Risk Table to determine the patient's CHD Risk.        ATP III CLASSIFICATION (LDL):  <100     mg/dL   Optimal  100-129  mg/dL   Near or Above                     Optimal  130-159  mg/dL   Borderline  160-189  mg/dL   High  >190     mg/dL   Very High Performed at [place] [address] [place], [place] [address]     Blood Alcohol level:  Lab Results  Component Value Date   [facility] <10 03/29/2019   ETH <10 89/[birth year]1941    Metabolic Disorder Labs:  Lab Results  Component Value Date   HGBA1C 5.4 09/13/2017   MPG 108.28 09/13/2017   MPG 117 12/11/2016   Lab Results  Component Value Date   PROLACTIN 15.5 (H) 12/11/2016   Lab Results  Component Value Date   CHOL 144 03/29/2019   TRIG 26 03/29/2019   HDL 59 03/29/2019   CHOLHDL 2.4 03/29/2019   VLDL 5 03/29/2019   LDLCALC 80 03/29/2019   LDLCALC 87 09/13/2017    Current Medications: Current Facility-Administered Medications  Medication Dose Route Frequency Provider Last Rate Last Dose  . acetaminophen (TYLENOL) tablet 650 mg  650 mg Oral Q6H PRN Mordecai Maes, NP      . alum & mag hydroxide-simeth (MAALOX/MYLANTA) 200-200-20 MG/5ML suspension 30 mL  30 mL Oral Q4H PRN Mordecai Maes, NP      . Derrill Memo ON 03/30/2019] ARIPiprazole (ABILIFY) tablet 5 mg  5 mg Oral Daily Uliana Brinker, Myer Peer, MD      . LORazepam (ATIVAN) tablet 0.5 mg  0.5 mg Oral Q6H PRN Shaquille Janes, Myer Peer, MD      . magnesium hydroxide (MILK OF MAGNESIA) suspension 30 mL  30 mL Oral Daily PRN Mordecai Maes, NP      . nicotine (NICODERM CQ - dosed in mg/24 hours) patch 21 mg  21 mg Transdermal Daily Linnell Swords, Myer Peer, MD   21 mg at 03/29/19 0820  . QUEtiapine (SEROQUEL) tablet 50 mg  50 mg Oral QHS Konnor Vondrasek, Myer Peer, MD       PTA Medications: Medications Prior to Admission  Medication Sig Dispense Refill Last Dose  . albuterol (PROVENTIL HFA;VENTOLIN HFA) 108 (90 Base) MCG/ACT inhaler Inhale 1 puff into the lungs every 6 (six) hours as needed for wheezing or shortness of breath. 1 Inhaler 1 Past Month at Unknown time  . fluticasone (FLONASE) 50 MCG/ACT nasal spray Place 1 spray into both nostrils  daily. 16 g 1 Past Month at Unknown time  . gabapentin (NEURONTIN) 600 MG tablet Take 0.5 tablets (300 mg total) by mouth 3 (three) times daily. 90 tablet 1 Past Month at Unknown time  . hydrOXYzine (ATARAX/VISTARIL) 50 MG tablet Take 1 tablet (50 mg total) by mouth 3 (three) times daily. 90 tablet 1 Past Month at Unknown time  .  loratadine (CLARITIN) 10 MG tablet Take 1 tablet (10 mg total) by mouth daily. 30 tablet 1 Past Month at Unknown time  . lurasidone (LATUDA) 20 MG TABS tablet Take 20 mg by mouth at bedtime.   Past Month at Unknown time  . pantoprazole (PROTONIX) 40 MG tablet Take 1 tablet (40 mg total) by mouth daily. 30 tablet 1 Past Month at Unknown time  . venlafaxine XR (EFFEXOR-XR) 75 MG 24 hr capsule Take 3 capsules (225 mg total) by mouth daily with breakfast. 90 capsule 1 Past Month at Unknown time    Musculoskeletal: Strength & Muscle Tone: L sided weakness Gait & Station: ambulates well with cane Patient leans: N/A  Psychiatric Specialty Exam: Physical Exam  Nursing note and vitals reviewed. Constitutional: He is oriented to person, place, and time. He appears well-developed and well-nourished.  Cardiovascular: Normal rate.  Respiratory: Effort normal.  Neurological: He is alert and oriented to person, place, and time.    Review of Systems  Constitutional: Negative.   Respiratory: Negative for cough and shortness of breath.   Cardiovascular: Negative for chest pain.  Gastrointestinal: Negative for nausea and vomiting.  Neurological: Negative for tremors.  Psychiatric/Behavioral: Positive for depression, substance abuse (cocaine) and suicidal ideas. Negative for hallucinations. The patient is not nervous/anxious and does not have insomnia.     Blood pressure (!) 134/96, pulse 97, temperature 98.2 F (36.8 C), temperature source Oral, resp. rate 18, height '5\' 10"'$  (1.778 m), weight 98 kg, SpO2 97 %.Body mass index is 30.99 kg/m.  See MD's admission SRA     Treatment Plan Summary: Daily contact with patient to assess and evaluate symptoms and progress in treatment and Medication management   Inpatient hospitalization.  See MD's admission SRA for medication management.  Patient will participate in the therapeutic group milieu.  Discharge disposition in progress.   Observation Level/Precautions:  15 minute checks  Laboratory:  CBC, TSH, a1c  Psychotherapy:  Group therapy  Medications:  See MAR  Consultations:  PRN  Discharge Concerns:  Safety and stabilization  Estimated LOS: 3-5 days  Other:     Physician Treatment Plan for Primary Diagnosis: MDD (major depressive disorder) Long Term Goal(s): Improvement in symptoms so as ready for discharge  Short Term Goals: Ability to identify changes in lifestyle to reduce recurrence of condition will improve, Ability to verbalize feelings will improve and Ability to disclose and discuss suicidal ideas  Physician Treatment Plan for Secondary Diagnosis: Principal Problem:   MDD (major depressive disorder) Active Problems:   Moderate bipolar I disorder, most recent episode depressed (HCC)   Substance induced mood disorder ([place])  Long Term Goal(s): Improvement in symptoms so as ready for discharge  Short Term Goals: Ability to demonstrate self-control will improve, Ability to identify and develop effective coping behaviors will improve and Ability to identify triggers associated with substance abuse/mental health issues will improve  I certify that inpatient services furnished can reasonably be expected to improve the patient's condition.    Connye Burkitt, NP 4/30/20204:57 PM   I have discussed case with NP and have met with patient  Agree with NP note and assessment  58 year old male.  Divorced.  Lives alone.  On SSDI.  2 adult children. Presented due to worsening depression over the last 1 to 2 weeks.  Attributes worsening mood in part to father's death anniversary last week.  He  describes neurovegetative symptoms of depression including poor sleep, low energy, anhedonia, which he describes as mild.  He  reports recent suicidal ideations, with thoughts of cutting his throat. Marland Kitchen He also reports he has been upset/angry at a peer at [employer] "he keeps on asking for my pipe  and he will not  stop ".  He states he was having homicidal thoughts towards this person ( named [name]), but denies any homicidal ideations at this time.  History of several prior psychiatric admissions, most recently last year ( for depression). He was admitted at [facility] in October 2018, for depression, suicidal ideations, diagnosed with Bipolar Disorder, Depressed at the time and discharged on Li, Seroquel, Effexor XR at the time. Patient states he follows up at [facility] for treatment and that Effexor XR, Li,  and Neurontin were stopped months ago. Currently on Seroquel, Latuda . Patient reports  history of bipolar disorder diagnoses.  He also states he has been diagnosed with PTSD stemming from history of combat experiences when young.  He described Seroquel and the [place].  He feels that the Seroquel (which she has been on for several years) has been helpful but he does not feel the Anette Guarneri is helping much and would prefer to change his medication.  States he has been on several different medications in the past, remembers Abilify is helpful.  Took it (in combination with Seroquel) for about 1 to 2 years in the past, does not member having had side effects.  Reports a history of prior suicide attempt by overdosing on Ambien years ago.  Denies history of psychosis. Patient reports a remote history of alcohol dependence, has not consumed alcohol in 20 years.  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  States he uses cocaine regularly, last used about 2 weeks ago.  Uses cannabis almost every day.  Admit BAL negative, admit UDS (+) for Cannabis and Cocaine Medical history is remarkable for left  hemiparesis which he states is a sequelae of viral encephalitis in his 31s.  Dx- Bipolar Disorder by history , currently depressed .  Plan - Inpatient admission. Medication options discussed. Continue Seroquel, D/C Latuda, start Abilify 5 mgrs QDAY initially.

## 2019-03-29 NOTE — [facility] Suicide Risk Assessment (Signed)
[facility] INPATIENT:  Family/Significant Other Suicide Prevention Education  Suicide Prevention Education:  Patient Refusal for Family/Significant Other Suicide Prevention Education: The patient Anthony [name] has refused to provide written consent for family/significant other to be provided Family/Significant Other Suicide Prevention Education during admission and/or prior to discharge.  Physician notified.  SPE completed with patient, as patient refused to consent to family contact. SPI pamphlet provided to pt and pt was encouraged to share information with support network, ask questions, and talk about any concerns relating to SPE. Patient denies access to guns/firearms and verbalized understanding of information provided. Mobile Crisis information also provided to patient.    [name] 03/29/2019, 10:43 AM

## 2019-03-29 NOTE — Progress Notes (Signed)
[place] NOVEL CORONAVIRUS (COVID-19) DAILY CHECK-OFF SYMPTOMS - answer yes or no to each - every day NO YES  Have you had a fever in the past 24 hours?  . Fever (Temp > 37.80C / 100F) X   Have you had any of these symptoms in the past 24 hours? . New Cough .  Sore Throat  .  Shortness of Breath .  Difficulty Breathing .  Unexplained Body Aches   X   Have you had any one of these symptoms in the past 24 hours not related to allergies?   . Runny Nose .  Nasal Congestion .  Sneezing   X   If you have had runny nose, nasal congestion, sneezing in the past 24 hours, has it worsened?  X   EXPOSURES - check yes or no X   Have you traveled outside the state in the past 14 days?  X   Have you been in contact with someone with a confirmed diagnosis of COVID-19 or PUI in the past 14 days without wearing appropriate PPE?  X   Have you been living in the same home as a person with confirmed diagnosis of COVID-19 or a PUI (household contact)?    X   Have you been diagnosed with COVID-19?    X              What to do next: Answered NO to all: Answered YES to anything:   Proceed with unit schedule Follow the [facility] Inpatient Flowsheet.   

## 2019-03-29 NOTE — Progress Notes (Signed)
D: Pt was in the dayroom upon initial approach.  Pt presents with depressed affect and mood.  He reports he is "doing good" and his goal is to "sleep good."  He is using cane for mobility.  Pt denies HI, denies hallucinations, denies pain.  He reports SI with plan to "step out in front of a bus, cut my throat."  Pt verbally contracts for safety with Probation officer.  He smiles on occasion while talking to staff.  Pt has been visible in milieu interacting with peers and staff appropriately.  Pt attended evening group.    A: Introduced self to pt.  Met with pt 1:1.  Actively listened to pt and offered support and encouragement.  Pt reports he usually takes Seroquel for sleep and on-site provider was notified.  New order placed and medication administered per order.  Fall prevention techniques reviewed with pt and he verbalized understanding.  Q15 minute safety checks maintained.  R: Pt is safe on the unit.  Pt is compliant with medication.  Pt verbally contracts for safety.  Will continue to monitor and assess.
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
## 2019-03-30 LAB — HEMOGLOBIN A1C
Hgb A1c MFr Bld: 5.7 % — ABNORMAL HIGH (ref 4.8–5.6)
Mean Plasma Glucose: 116.89 mg/dL

## 2019-03-30 MED ORDER — QUETIAPINE FUMARATE 25 MG PO TABS
25.0000 mg | ORAL_TABLET | Freq: Every day | ORAL | Status: DC
  Administered 2019-03-30: 25 mg via ORAL
  Filled 2019-03-30 (×4): qty 1

## 2019-03-30 MED ORDER — ARIPIPRAZOLE 2 MG PO TABS
2.0000 mg | ORAL_TABLET | Freq: Every day | ORAL | Status: DC
  Filled 2019-03-30 (×3): qty 1

## 2019-03-30 NOTE — Progress Notes (Signed)
D: Patient observed up and visible in dayroom all evening. Social with peers. Patient denies any needs. Patient's affect masked, superficial, mood depressed. Denies pain, physical complaints. COVID-19 screen negative, afebrile. Respiratory assessment WDL.  A: Medicated per orders, no prns requested. Medication education provided. Level III obs in place for safety. Emotional support offered. Patient encouraged to complete Suicide Safety Plan before discharge. Encouraged to attend and participate in unit programming.  Fall prevention plan in place and reviewed with patient as pt is a high fall risk.   R: Patient verbalizes understanding of POC, falls prevention education. Patient denies SI/HI/AVH and remains safe on level III obs. Will continue to monitor throughout the night.

## 2019-03-30 NOTE — Progress Notes (Signed)
D:Pt reported passive si this morning and contracted for safety. He requested a mask this afternoon and when assessed, he reported that he wanted a mask because he was watching the world news and it made him scared. Pt said that the Abilify that he was given this morning made him sleepy and he did not want to take that medication again.  A:Offered support, encouragement and 15 minute checks.  R:Pt denies si and hi. Safety maintained on the unit.

## 2019-03-30 NOTE — [facility] Group Notes (Signed)
LCSW Group Therapy Note 03/30/2019 2:38 PM  Type of Therapy and Topic: Group Therapy: Overcoming Obstacles  Participation Level: Active  Description of Group:  In this group patients will be encouraged to explore what they see as obstacles to their own wellness and recovery. They will be guided to discuss their thoughts, feelings, and behaviors related to these obstacles. The group will process together ways to cope with barriers, with attention given to specific choices patients can make. Each patient will be challenged to identify changes they are motivated to make in order to overcome their obstacles. This group will be process-oriented, with patients participating in exploration of their own experiences as well as giving and receiving support and challenge from other group members.  Therapeutic Goals: 1. Patient will identify personal and current obstacles as they relate to admission. 2. Patient will identify barriers that currently interfere with their wellness or overcoming obstacles.  3. Patient will identify feelings, thought process and behaviors related to these barriers. 4. Patient will identify two changes they are willing to make to overcome these obstacles:   Summary of Patient Progress  Anthony [name] was engaged and participated throughout the group session. Anthony [name] was reports he does not feel well physically. He states that he has noticed his balance being "off" and wonders whether his medications are the reason.    Therapeutic Modalities:  Cognitive Behavioral Therapy Solution Focused Therapy Motivational Interviewing Relapse Prevention Therapy   Alcario Drought Clinical Social Worker

## 2019-03-30 NOTE — Progress Notes (Signed)
Adult Psychoeducational Group Note  Date:  03/30/2019 Time:  8:33 PM  Group Topic/Focus:  Wrap-Up Group:   The focus of this group is to help patients review their daily goal of treatment and discuss progress on daily workbooks.  Participation Level:  Active  Participation Quality:  Appropriate  Affect:  Appropriate  Cognitive:  Appropriate  Insight: Appropriate  Engagement in Group:  Engaged  Modes of Intervention:  Discussion  Additional Comments:  Pt stated he did not have any goals due to sleeping all day.  Pt stated his medication dosage was too much and made him sleep the day away.  Pt rated the day at a 8/10.  Adelfa Lozito 03/30/2019, 8:33 PM

## 2019-03-30 NOTE — Progress Notes (Signed)
[facility] MD Progress Note  03/30/2019 4:20 PM Boden Stucky  MRN:  [id number] Subjective:  Patient reports he is feeling better, and today denies suicidal ideations. States " I am feeling a whole lot better, I hope I can go home soon". Also denies homicidal or violent ideations towards anyone at this time. He reports he has had some dizziness today, which he attributes to Abilify.  Objective : I have reviewed case with treatment team and have met with patient . 58 year old male, lives alone , on disability. Presented for worsening depression, neuro-vegetative symptoms, and suicidal ideations of cutting his throat. Attributed depression to recent anniversary of father's death.  He also reported , on admission, some HI towards a peer named " [name]", because he was angry this person constantly asked him for his pipe/smoke. He has been diagnosed with Bipolar Disorder in the past , and reports history of PTSD.  At this time patient reports he is feeling a lot better and as above currently denies suicidal ideations, denies any homicidal ideations, specifically also denies any violent or homicidal ideations towards " [name]", and states " I am not going near him, I have no problem with him".  He reports Abilify has caused him to feel lightheaded. ( Patient had been on Seroquel /Latuda prior to admission but felt that the latter was not working well, and reported prior history of good response to Seroquel/Abilify combination). Patient has been visible on unit, interacting with peers . No disruptive or agitated behaviors. Labs reviewed - TSH WNL, HgbA1C 5.7   Principal Problem: MDD (major depressive disorder) Diagnosis: Principal Problem:   MDD (major depressive disorder) Active Problems:   Moderate bipolar I disorder, most recent episode depressed (HCC)   Substance induced mood disorder ([place])  Total Time spent with patient: 15 minutes  Past Psychiatric History:   Past Medical History:  Past Medical  History:  Diagnosis Date  . Bell's palsy   . Cannabis abuse   . Cellulitis of right leg 04/2017  . Chronic left shoulder pain   . Depression   . Encephalitis   . Episodic mood disorder ([place])   . Homelessness   . Post traumatic stress disorder   . Seizures ([place])    last seizure 12 yrs ago  . Sepsis ([place]) 04/2017    Past Surgical History:  Procedure Laterality Date  . APPENDECTOMY    . brain damage    . CHOLECYSTECTOMY    . FOOT SURGERY Bilateral   . KNEE SURGERY Right    Family History:  Family History  Problem Relation Age of Onset  . Cancer Mother   . Heart attack Father   . Mental illness Sister   . Alcoholism Brother    Family Psychiatric  History:  Social History:  Social History   Substance and Sexual Activity  Alcohol Use No     Social History   Substance and Sexual Activity  Drug Use Yes  . Frequency: 2.0 times per week  . Types: Marijuana   Comment: Denies but has a history.     Social History   Socioeconomic History  . Marital status: Divorced    Spouse name: Not on file  . Number of children: Not on file  . Years of education: Not on file  . Highest education level: Not on file  Occupational History  . Not on file  Social Needs  . Financial resource strain: Not on file  . Food insecurity:    Worry: Not  on file    Inability: Not on file  . Transportation needs:    Medical: Not on file    Non-medical: Not on file  Tobacco Use  . Smoking status: Current Every Day Smoker    Packs/day: 1.00    Years: 38.00    Pack years: 38.00    Types: Cigarettes  . Smokeless tobacco: Never Used  Substance and Sexual Activity  . Alcohol use: No  . Drug use: Yes    Frequency: 2.0 times per week    Types: Marijuana    Comment: Denies but has a history.   . Sexual activity: Yes    Birth control/protection: Condom  Lifestyle  . Physical activity:    Days per week: Not on file    Minutes per session: Not on file  . Stress: Not on file  Relationships   . Social connections:    Talks on phone: Not on file    Gets together: Not on file    Attends religious service: Not on file    Active member of club or organization: Not on file    Attends meetings of clubs or organizations: Not on file    Relationship status: Not on file  Other Topics Concern  . Not on file  Social History Narrative  . Not on file   Additional Social History:    Pain Medications: see MAR Prescriptions: see MAR Over the Counter: see MAR History of alcohol / drug use?: No history of alcohol / drug abuse  Sleep: improved   Appetite:  Good  Current Medications: Current Facility-Administered Medications  Medication Dose Route Frequency Provider Last Rate Last Dose  . acetaminophen (TYLENOL) tablet 650 mg  650 mg Oral Q6H PRN Mordecai Maes, NP   650 mg at 03/30/19 0867  . alum & mag hydroxide-simeth (MAALOX/MYLANTA) 200-200-20 MG/5ML suspension 30 mL  30 mL Oral Q4H PRN Mordecai Maes, NP   30 mL at 03/29/19 1728  . ARIPiprazole (ABILIFY) tablet 5 mg  5 mg Oral Daily , Myer Peer, MD   5 mg at 03/30/19 0823  . LORazepam (ATIVAN) tablet 0.5 mg  0.5 mg Oral Q6H PRN , Myer Peer, MD      . magnesium hydroxide (MILK OF MAGNESIA) suspension 30 mL  30 mL Oral Daily PRN Mordecai Maes, NP      . nicotine (NICODERM CQ - dosed in mg/24 hours) patch 21 mg  21 mg Transdermal Daily , Myer Peer, MD   21 mg at 03/30/19 6195  . QUEtiapine (SEROQUEL) tablet 50 mg  50 mg Oral QHS , Myer Peer, MD   50 mg at 03/29/19 2239    Lab Results:  Results for orders placed or performed during the hospital encounter of 03/28/19 (from the past 48 hour(s))  Urine rapid drug screen (hosp performed)not at [facility]     Status: Abnormal   Collection Time: 03/28/19  7:00 PM  Result Value Ref Range   Opiates NONE DETECTED NONE DETECTED   Cocaine POSITIVE (A) NONE DETECTED   Benzodiazepines NONE DETECTED NONE DETECTED   Amphetamines NONE DETECTED NONE DETECTED    Tetrahydrocannabinol POSITIVE (A) NONE DETECTED   Barbiturates NONE DETECTED NONE DETECTED    Comment: (NOTE) DRUG SCREEN FOR MEDICAL PURPOSES ONLY.  IF CONFIRMATION IS NEEDED FOR ANY PURPOSE, NOTIFY LAB WITHIN 5 DAYS. LOWEST DETECTABLE LIMITS FOR URINE DRUG SCREEN Drug Class  Cutoff (ng/mL) Amphetamine and metabolites    1000 Barbiturate and metabolites    200 Benzodiazepine                 888 Tricyclics and metabolites     300 Opiates and metabolites        300 Cocaine and metabolites        300 THC                            50 Performed at [facility], [place] [address], [place], [place] [address]   Comprehensive metabolic panel     Status: Abnormal   Collection Time: 03/29/19  6:56 AM  Result Value Ref Range   Sodium 141 135 - 145 mmol/L   Potassium 4.1 3.5 - 5.1 mmol/L   Chloride 113 (H) 98 - 111 mmol/L   CO2 18 (L) 22 - 32 mmol/L   Glucose, Bld 94 70 - 99 mg/dL   BUN 25 (H) 6 - 20 mg/dL   Creatinine, Ser 1.06 0.61 - 1.24 mg/dL   Calcium 8.7 (L) 8.9 - 10.3 mg/dL   Total Protein 7.0 6.5 - 8.1 g/dL   Albumin 4.1 3.5 - 5.0 g/dL   AST 25 15 - 41 U/L   ALT 31 0 - 44 U/L   Alkaline Phosphatase 65 38 - 126 U/L   Total Bilirubin 0.5 0.3 - 1.2 mg/dL   GFR calc non Af Amer >60 >60 mL/min   GFR calc Af Amer >60 >60 mL/min   Anion gap 10 5 - 15    Comment: Performed at [facility], [place] [address] [place], [place] [address]  Ethanol     Status: None   Collection Time: 03/29/19  6:56 AM  Result Value Ref Range   Alcohol, Ethyl (B) <10 <10 mg/dL    Comment: (NOTE) Lowest detectable limit for serum alcohol is 10 mg/dL. For medical purposes only. Performed at [facility], [place] [address], [place], [place] [address]   Lipid panel     Status: None   Collection Time: 03/29/19  6:56 AM  Result Value Ref Range   Cholesterol 144 0 - 200 mg/dL    Comment:        ATP III CLASSIFICATION:  <200     mg/dL    Desirable  200-239  mg/dL   Borderline High  >=240    mg/dL   High           Triglycerides 26 <150 mg/dL   HDL 59 >40 mg/dL   Total CHOL/HDL Ratio 2.4 RATIO   VLDL 5 0 - 40 mg/dL   LDL Cholesterol 80 0 - 99 mg/dL    Comment:        Total Cholesterol/HDL:CHD Risk Coronary Heart Disease Risk Table                     Men   Women  1/2 Average Risk   3.4   3.3  Average Risk       5.0   4.4  2 X Average Risk   9.6   7.1  3 X Average Risk  23.4   11.0        Use the calculated Patient Ratio above and the CHD Risk Table to determine the patient's CHD Risk.        ATP III CLASSIFICATION (LDL):  <100  mg/dL   Optimal  100-129  mg/dL   Near or Above                    Optimal  130-159  mg/dL   Borderline  160-189  mg/dL   High  >190     mg/dL   Very High Performed at [place] [address] [place], [place] [address]   CBC     Status: Abnormal   Collection Time: 03/29/19  6:31 PM  Result Value Ref Range   WBC 11.1 (H) 4.0 - 10.5 K/uL   RBC 5.42 4.22 - 5.81 MIL/uL   Hemoglobin 16.1 13.0 - 17.0 g/dL   HCT 48.8 39.0 - 52.0 %   MCV 90.0 80.0 - 100.0 fL   MCH 29.7 26.0 - 34.0 pg   MCHC 33.0 30.0 - 36.0 g/dL   RDW 14.2 11.5 - 15.5 %   Platelets 254 150 - 400 K/uL   nRBC 0.0 0.0 - 0.2 %    Comment: Performed at [facility], [place] [address] [place], [place] [address]  Hemoglobin A1c     Status: Abnormal   Collection Time: 03/29/19  6:31 PM  Result Value Ref Range   Hgb A1c MFr Bld 5.7 (H) 4.8 - 5.6 %    Comment: (NOTE) Pre diabetes:          5.7%-6.4% Diabetes:              >6.4% Glycemic control for   <7.0% adults with diabetes    Mean Plasma Glucose 116.89 mg/dL    Comment: Performed at [place] [address] [place], [place] [address]  TSH     Status: None   Collection Time: 03/29/19  6:31 PM  Result Value Ref Range   TSH 2.426 0.350 - 4.500 uIU/mL    Comment: Performed by a 3rd Generation assay with a  functional sensitivity of <=0.01 uIU/mL. Performed at [facility], [place] [address] [place], [place] [address]     Blood Alcohol level:  Lab Results  Component Value Date   ETH <10 03/29/2019   ETH <10 59/74/1638    Metabolic Disorder Labs: Lab Results  Component Value Date   HGBA1C 5.7 (H) 03/29/2019   MPG 116.89 03/29/2019   MPG 108.28 09/13/2017   Lab Results  Component Value Date   PROLACTIN 15.5 (H) 12/11/2016   Lab Results  Component Value Date   CHOL 144 03/29/2019   TRIG 26 03/29/2019   HDL 59 03/29/2019   CHOLHDL 2.4 03/29/2019   VLDL 5 03/29/2019   LDLCALC 80 03/29/2019   LDLCALC 87 09/13/2017    Physical Findings: AIMS: Facial and Oral Movements Muscles of Facial Expression: None, normal Lips and Perioral Area: None, normal Jaw: None, normal Tongue: None, normal,Extremity Movements Upper (arms, wrists, hands, fingers): None, normal Lower (legs, knees, ankles, toes): None, normal, Trunk Movements Neck, shoulders, hips: None, normal, Overall Severity Severity of abnormal movements (highest score from questions above): None, normal Incapacitation due to abnormal movements: None, normal Patient's awareness of abnormal movements (rate only patient's report): No Awareness, Dental Status Current problems with teeth and/or dentures?: Yes Does patient usually wear dentures?: No  CIWA:    COWS:     Musculoskeletal: Strength & Muscle Tone: hemiparesia  Gait & Station: ambulates slowly with cane  Patient leans: N/A  Psychiatric Specialty Exam: Physical Exam  ROS denies chest pain, no shortness of breath, no  vomiting. Denies cough. Reports lightheadedness .  Blood pressure 103/75, pulse 77, temperature 97.9 F (36.6 C), temperature source Oral, resp. rate 18, height _0  (1.778 m), weight 98 kg, SpO2 97 %.Body mass index is 30.99 kg/m.  General Appearance: Fairly Groomed  Eye Contact:  Good  Speech:  Normal Rate  Volume:  Decreased   Mood:  reports mood is " a lot better"  Affect:  blunted, does smile briefly at times   Thought Process:  Linear and Descriptions of Associations: Intact  Orientation:  Other:  fully alert and attentive  Thought Content:  denies hallucinations, no delusions expressed   Suicidal Thoughts:  No currently denies suicidal ideations, contracts for safety on unit, also denies homicidal or violent ideations - see above   Homicidal Thoughts:  No  Memory:  recent and remote grossly intact   Judgement:  Fair/ improving   Insight:  Fair  Psychomotor Activity:  Decreased  Concentration:  Concentration: Good and Attention Span: Good  Recall:  Good  Fund of Knowledge:  Good  Language:  Good  Akathisia:  Negative  Handed:  Right  AIMS (if indicated):     Assets:  Desire for Improvement Resilience  ADL's:  Intact  Cognition:  WNL  Sleep:  Number of Hours: 4.75   Assessment -  58 year old male, lives alone , on disability. Presented for worsening depression, neuro-vegetative symptoms, and suicidal ideations of cutting his throat. Attributed depression to recent anniversary of father's death.  He also reported , on admission, some HI towards a peer named " [name]", because he was angry this person constantly asked him for his pipe/smoke. He has been diagnosed with Bipolar Disorder in the past , and reports history of PTSD.  Patient reports feeling better today, and at this time minimizes depression, denies suicidal ideations, and presents future oriented, hoping for discharge soon. Today denies suicidal or self injurious ideations , as well as any violent or homicidal ideations. Specifically also denies HI or violent ideations towards " [name]"  , which he reports  is an associate he had been having violent thoughts towards on admission. On Seroquel and Abilify, which is new, but which he reports he had been on in the past . Reports dizziness/lightheadedness .   Treatment Plan Summary: Daily  contact with patient to assess and evaluate symptoms and progress in treatment, Medication management, Plan inpatient treatment ' and medications as below Encourage group and milieu participation Decrease Seroquel to 25 mgrs QHS for mood disorder- dose taper to minimize side effects/sedation, will titrate as tolerated  Decrease Abilify to 2 mgrs QDAY for mood disorder- same rational as above, will titrate as tolerated  Continue Ativan 0.5 mgr Q 6 hours PRN for anxiety Treatment team working on disposition planning options Jenne Campus, MD 03/30/2019, 4:20 PM

## 2019-03-30 NOTE — Tx Team (Signed)
Interdisciplinary Treatment and Diagnostic Plan Update  03/30/2019 Time of Session:  Anthony [name] MRN: [id number]  Principal Diagnosis: MDD (major depressive disorder)  Secondary Diagnoses: Principal Problem:   MDD (major depressive disorder) Active Problems:   Moderate bipolar I disorder, most recent episode depressed (HCC)   Substance induced mood disorder (HCC)   Current Medications:  Current Facility-Administered Medications  Medication Dose Route Frequency Provider Last Rate Last Dose  . acetaminophen (TYLENOL) tablet 650 mg  650 mg Oral Q6H PRN Denzil Magnuson, NP      . alum & mag hydroxide-simeth (MAALOX/MYLANTA) 200-200-20 MG/5ML suspension 30 mL  30 mL Oral Q4H PRN Denzil Magnuson, NP   30 mL at 03/29/19 1728  . ARIPiprazole (ABILIFY) tablet 5 mg  5 mg Oral Daily Cobos, Rockey Situ, MD   5 mg at 03/30/19 0823  . LORazepam (ATIVAN) tablet 0.5 mg  0.5 mg Oral Q6H PRN Cobos, Rockey Situ, MD      . magnesium hydroxide (MILK OF MAGNESIA) suspension 30 mL  30 mL Oral Daily PRN Denzil Magnuson, NP      . nicotine (NICODERM CQ - dosed in mg/24 hours) patch 21 mg  21 mg Transdermal Daily Cobos, Rockey Situ, MD   21 mg at 03/30/19 3244  . QUEtiapine (SEROQUEL) tablet 50 mg  50 mg Oral QHS Cobos, Rockey Situ, MD   50 mg at 03/29/19 2239   PTA Medications: Medications Prior to Admission  Medication Sig Dispense Refill Last Dose  . albuterol (PROVENTIL HFA;VENTOLIN HFA) 108 (90 Base) MCG/ACT inhaler Inhale 1 puff into the lungs every 6 (six) hours as needed for wheezing or shortness of breath. 1 Inhaler 1 Past Month at Unknown time  . fluticasone (FLONASE) 50 MCG/ACT nasal spray Place 1 spray into both nostrils daily. 16 g 1 Past Month at Unknown time  . gabapentin (NEURONTIN) 600 MG tablet Take 0.5 tablets (300 mg total) by mouth 3 (three) times daily. 90 tablet 1 Past Month at Unknown time  . hydrOXYzine (ATARAX/VISTARIL) 50 MG tablet Take 1 tablet (50 mg total) by mouth 3 (three) times  daily. 90 tablet 1 Past Month at Unknown time  . loratadine (CLARITIN) 10 MG tablet Take 1 tablet (10 mg total) by mouth daily. 30 tablet 1 Past Month at Unknown time  . lurasidone (LATUDA) 20 MG TABS tablet Take 20 mg by mouth at bedtime.   Past Month at Unknown time  . pantoprazole (PROTONIX) 40 MG tablet Take 1 tablet (40 mg total) by mouth daily. 30 tablet 1 Past Month at Unknown time  . venlafaxine XR (EFFEXOR-XR) 75 MG 24 hr capsule Take 3 capsules (225 mg total) by mouth daily with breakfast. 90 capsule 1 Past Month at Unknown time    Patient Stressors: Financial difficulties Health problems Medication change or noncompliance  Patient Strengths: Ability for insight Capable of independent living Motivation for treatment/growth Physical Health Supportive family/friends  Treatment Modalities: Medication Management, Group therapy, Case management,  1 to 1 session with clinician, Psychoeducation, Recreational therapy.   Physician Treatment Plan for Primary Diagnosis: MDD (major depressive disorder) Long Term Goal(s): Improvement in symptoms so as ready for discharge Improvement in symptoms so as ready for discharge   Short Term Goals: Ability to identify changes in lifestyle to reduce recurrence of condition will improve Ability to verbalize feelings will improve Ability to disclose and discuss suicidal ideas Ability to demonstrate self-control will improve Ability to identify and develop effective coping behaviors will improve Ability to identify triggers associated  with substance abuse/mental health issues will improve  Medication Management: Evaluate patient's response, side effects, and tolerance of medication regimen.  Therapeutic Interventions: 1 to 1 sessions, Unit Group sessions and Medication administration.  Evaluation of Outcomes: Progressing  Physician Treatment Plan for Secondary Diagnosis: Principal Problem:   MDD (major depressive disorder) Active Problems:    Moderate bipolar I disorder, most recent episode depressed (HCC)   Substance induced mood disorder (HCC)  Long Term Goal(s): Improvement in symptoms so as ready for discharge Improvement in symptoms so as ready for discharge   Short Term Goals: Ability to identify changes in lifestyle to reduce recurrence of condition will improve Ability to verbalize feelings will improve Ability to disclose and discuss suicidal ideas Ability to demonstrate self-control will improve Ability to identify and develop effective coping behaviors will improve Ability to identify triggers associated with substance abuse/mental health issues will improve     Medication Management: Evaluate patient's response, side effects, and tolerance of medication regimen.  Therapeutic Interventions: 1 to 1 sessions, Unit Group sessions and Medication administration.  Evaluation of Outcomes: Progressing   RN Treatment Plan for Primary Diagnosis: MDD (major depressive disorder) Long Term Goal(s): Knowledge of disease and therapeutic regimen to maintain health will improve  Short Term Goals: Ability to participate in decision making will improve, Ability to verbalize feelings will improve, Ability to disclose and discuss suicidal ideas, Ability to identify and develop effective coping behaviors will improve and Compliance with prescribed medications will improve  Medication Management: RN will administer medications as ordered by provider, will assess and evaluate patient's response and provide education to patient for prescribed medication. RN will report any adverse and/or side effects to prescribing provider.  Therapeutic Interventions: 1 on 1 counseling sessions, Psychoeducation, Medication administration, Evaluate responses to treatment, Monitor vital signs and CBGs as ordered, Perform/monitor CIWA, COWS, AIMS and Fall Risk screenings as ordered, Perform wound care treatments as ordered.  Evaluation of Outcomes:  Progressing   LCSW Treatment Plan for Primary Diagnosis: MDD (major depressive disorder) Long Term Goal(s): Safe transition to appropriate next level of care at discharge, Engage patient in therapeutic group addressing interpersonal concerns.  Short Term Goals: Engage patient in aftercare planning with referrals and resources  Therapeutic Interventions: Assess for all discharge needs, 1 to 1 time with Social worker, Explore available resources and support systems, Assess for adequacy in community support network, Educate family and significant other(s) on suicide prevention, Complete Psychosocial Assessment, Interpersonal group therapy.  Evaluation of Outcomes: Progressing   Progress in Treatment: Attending groups: Yes. Participating in groups: Yes. Taking medication as prescribed: Yes. Toleration medication: Yes. Family/Significant other contact made: No, will contact:  patient declined consent for collateral contacts Patient understands diagnosis: Yes. Discussing patient identified problems/goals with staff: Yes. Medical problems stabilized or resolved: Yes. Denies suicidal/homicidal ideation: Yes. Issues/concerns per patient self-inventory: No. Other:   New problem(s) identified:  None   New Short Term/Long Term Goal(s):medication stabilization, elimination of SI thoughts, development of comprehensive mental wellness plan.    Patient Goals:    Discharge Plan or Barriers: Patient plans to return home to his apartment alone. He reports he would like to follow up with an outpatient medication management and therapy services at discharge. CSW will continue to follow and assess for appropriate referrals and possible discharge planning.   Reason for Continuation of Hospitalization: Anxiety Depression Medication stabilization Suicidal ideation  Estimated Length of Stay: 3-5 days   Attendees: Patient: 03/30/2019 8:30 AM  Physician: Dr. Nehemiah MassedFernando Cobos, MD  03/30/2019 8:30 AM   Nursing: Jan.W, RN 03/30/2019 8:30 AM  RN Care Manager: 03/30/2019 8:30 AM  Social Worker: Baldo Daub, LCSWA 03/30/2019 8:30 AM  Recreational Therapist:  03/30/2019 8:30 AM  Other:  03/30/2019 8:30 AM  Other:  03/30/2019 8:30 AM  Other: 03/30/2019 8:30 AM    Scribe for Treatment Team: [name], LCSWA 03/30/2019 8:30 AM

## 2019-03-30 NOTE — Progress Notes (Signed)
Recreation Therapy Notes  Date:  5.1.20 Time: 0930 Location: [address] Dayroom  Group Topic: Stress Management  Goal Area(s) Addresses:  Patient will identify positive stress management techniques. Patient will identify benefits of using stress management post d/c.  Intervention:  Stress Management  Activity :  Meditation.  LRT introduced the stress management technique of meditation.  Meditation focused on making the most of the and the possibilities that come with it.  Patients were to follow along as meditation played in order to engage in activity.  Education:  Stress Management, Discharge Planning.   Education Outcome: Acknowledges Education  Clinical Observations/Feedback:  Pt did not attend group.    Shai Mckenzie, LRT/CTRS         Madalin Hughart A 03/30/2019 10:58 AM 

## 2019-03-31 MED ORDER — QUETIAPINE FUMARATE 50 MG PO TABS
50.0000 mg | ORAL_TABLET | Freq: Every day | ORAL | Status: DC
  Administered 2019-04-01: 25 mg via ORAL
  Filled 2019-03-31 (×3): qty 1

## 2019-03-31 MED ORDER — QUETIAPINE FUMARATE 25 MG PO TABS
25.0000 mg | ORAL_TABLET | Freq: Once | ORAL | Status: AC
  Administered 2019-03-31: 25 mg via ORAL
  Filled 2019-03-31 (×2): qty 1

## 2019-03-31 MED ORDER — VENLAFAXINE HCL ER 37.5 MG PO CP24
37.5000 mg | ORAL_CAPSULE | Freq: Every day | ORAL | Status: DC
  Administered 2019-04-01 – 2019-04-02 (×2): 37.5 mg via ORAL
  Filled 2019-03-31 (×3): qty 1

## 2019-03-31 NOTE — Progress Notes (Signed)
Adult Psychoeducational Group Note  Date:  03/31/2019 Time:  6:35 PM  Group Topic/Focus:  Goals Group:   The focus of this group is to help patients establish daily goals to achieve during treatment and discuss how the patient can incorporate goal setting into their daily lives to aide in recovery.  Participation Level:  Active  Participation Quality:  Appropriate  Affect:  Appropriate  Cognitive:  Appropriate  Insight: Appropriate  Engagement in Group:  Engaged  Modes of Intervention:  Discussion  Additional Comments:  Pt attended group and participated in discussion.  Mariel Gaudin R Anchor Dwan 03/31/2019, 6:35 PM

## 2019-03-31 NOTE — Progress Notes (Signed)
[facility] MD Progress Note  03/31/2019 3:50 PM Anthony [name]  MRN:  [id number] Subjective:  He reports he is feeling "OK", currently describes improving mood and denies suicidal ideations. He denies homicidal or violent ideations. He states he did not tolerate Abilify well , as he feels it is causing some dizziness , and prefers to discontinue this medication. Seroquel has been better tolerated, reports mild sedation.  Objective : I have reviewed chart notes and have met with patient . 58 year old male, lives alone , on disability. Presented for worsening depression, neuro-vegetative symptoms, and suicidal ideations of cutting his throat. Attributed depression to recent anniversary of father's death.  He also reported , on admission, some HI towards a peer named " Anthony [name]", because he was angry this person constantly asked him for his pipe/smoke. He has been diagnosed with Bipolar Disorder in the past , and reports history of PTSD.   Patient reports he is feeling better than on admission and currently denies suicidal ideations and presents future oriented, hopeful for discharge soon. He also denies homicidal ideations towards anyone and specifically also denies HI towards the individual he had mentioned on admission . No disruptive or agitated behaviors on unit.Has been visible in day room, interactive with peers . Reports dizziness , lightheadedness related to Abilify, even at current lower dose and  prefers to discontinue. We discussed options and at this time prefers to continue Seroquel as monotherapy.    Principal Problem: MDD (major depressive disorder) Diagnosis: Principal Problem:   MDD (major depressive disorder) Active Problems:   Moderate bipolar I disorder, most recent episode depressed (HCC)   Substance induced mood disorder ([place])  Total Time spent with patient: 15 minutes  Past Psychiatric History:   Past Medical History:  Past Medical History:  Diagnosis Date  . Bell's palsy   .  Cannabis abuse   . Cellulitis of right leg 04/2017  . Chronic left shoulder pain   . Depression   . Encephalitis   . Episodic mood disorder ([place])   . Homelessness   . Post traumatic stress disorder   . Seizures ([place])    last seizure 12 yrs ago  . Sepsis ([place]) 04/2017    Past Surgical History:  Procedure Laterality Date  . APPENDECTOMY    . brain damage    . CHOLECYSTECTOMY    . FOOT SURGERY Bilateral   . KNEE SURGERY Right    Family History:  Family History  Problem Relation Age of Onset  . Cancer Mother   . Heart attack Father   . Mental illness Sister   . Alcoholism Brother    Family Psychiatric  History:  Social History:  Social History   Substance and Sexual Activity  Alcohol Use No     Social History   Substance and Sexual Activity  Drug Use Yes  . Frequency: 2.0 times per week  . Types: Marijuana   Comment: Denies but has a history.     Social History   Socioeconomic History  . Marital status: Divorced    Spouse name: Not on file  . Number of children: Not on file  . Years of education: Not on file  . Highest education level: Not on file  Occupational History  . Not on file  Social Needs  . Financial resource strain: Not on file  . Food insecurity:    Worry: Not on file    Inability: Not on file  . Transportation needs:    Medical: Not on  file    Non-medical: Not on file  Tobacco Use  . Smoking status: Current Every Day Smoker    Packs/day: 1.00    Years: 38.00    Pack years: 38.00    Types: Cigarettes  . Smokeless tobacco: Never Used  Substance and Sexual Activity  . Alcohol use: No  . Drug use: Yes    Frequency: 2.0 times per week    Types: Marijuana    Comment: Denies but has a history.   . Sexual activity: Yes    Birth control/protection: Condom  Lifestyle  . Physical activity:    Days per week: Not on file    Minutes per session: Not on file  . Stress: Not on file  Relationships  . Social connections:    Talks on phone: Not  on file    Gets together: Not on file    Attends religious service: Not on file    Active member of club or organization: Not on file    Attends meetings of clubs or organizations: Not on file    Relationship status: Not on file  Other Topics Concern  . Not on file  Social History Narrative  . Not on file   Additional Social History:    Pain Medications: see MAR Prescriptions: see MAR Over the Counter: see MAR History of alcohol / drug use?: No history of alcohol / drug abuse  Sleep: improved   Appetite:  Good  Current Medications: Current Facility-Administered Medications  Medication Dose Route Frequency Provider Last Rate Last Dose  . acetaminophen (TYLENOL) tablet 650 mg  650 mg Oral Q6H PRN Mordecai Maes, NP   650 mg at 03/31/19 3299  . alum & mag hydroxide-simeth (MAALOX/MYLANTA) 200-200-20 MG/5ML suspension 30 mL  30 mL Oral Q4H PRN Mordecai Maes, NP   30 mL at 03/29/19 1728  . LORazepam (ATIVAN) tablet 0.5 mg  0.5 mg Oral Q6H PRN , Myer Peer, MD      . magnesium hydroxide (MILK OF MAGNESIA) suspension 30 mL  30 mL Oral Daily PRN Mordecai Maes, NP      . nicotine (NICODERM CQ - dosed in mg/24 hours) patch 21 mg  21 mg Transdermal Daily , Myer Peer, MD   21 mg at 03/31/19 2426  . QUEtiapine (SEROQUEL) tablet 50 mg  50 mg Oral QHS , Myer Peer, MD      . Derrill Memo ON 04/01/2019] venlafaxine XR (EFFEXOR-XR) 24 hr capsule 37.5 mg  37.5 mg Oral Q breakfast , Myer Peer, MD        Lab Results:  Results for orders placed or performed during the hospital encounter of 03/28/19 (from the past 48 hour(s))  CBC     Status: Abnormal   Collection Time: 03/29/19  6:31 PM  Result Value Ref Range   WBC 11.1 (H) 4.0 - 10.5 K/uL   RBC 5.42 4.22 - 5.81 MIL/uL   Hemoglobin 16.1 13.0 - 17.0 g/dL   HCT 48.8 39.0 - 52.0 %   MCV 90.0 80.0 - 100.0 fL   MCH 29.7 26.0 - 34.0 pg   MCHC 33.0 30.0 - 36.0 g/dL   RDW 14.2 11.5 - 15.5 %   Platelets 254 150 - 400 K/uL    nRBC 0.0 0.0 - 0.2 %    Comment: Performed at [facility], [place] [address] [place], [place] [address]  Hemoglobin A1c     Status: Abnormal   Collection Time: 03/29/19  6:31 PM  Result Value Ref  Range   Hgb A1c MFr Bld 5.7 (H) 4.8 - 5.6 %    Comment: (NOTE) Pre diabetes:          5.7%-6.4% Diabetes:              >6.4% Glycemic control for   <7.0% adults with diabetes    Mean Plasma Glucose 116.89 mg/dL    Comment: Performed at [place] [address] [place], [place] [address]  TSH     Status: None   Collection Time: 03/29/19  6:31 PM  Result Value Ref Range   TSH 2.426 0.350 - 4.500 uIU/mL    Comment: Performed by a 3rd Generation assay with a functional sensitivity of <=0.01 uIU/mL. Performed at [facility], [place] [address], [place], [place] [address]     Blood Alcohol level:  Lab Results  Component Value Date   ETH <10 03/29/2019   ETH <10 21/09/7355    Metabolic Disorder Labs: Lab Results  Component Value Date   HGBA1C 5.7 (H) 03/29/2019   MPG 116.89 03/29/2019   MPG 108.28 09/13/2017   Lab Results  Component Value Date   PROLACTIN 15.5 (H) 12/11/2016   Lab Results  Component Value Date   CHOL 144 03/29/2019   TRIG 26 03/29/2019   HDL 59 03/29/2019   CHOLHDL 2.4 03/29/2019   VLDL 5 03/29/2019   LDLCALC 80 03/29/2019   LDLCALC 87 09/13/2017    Physical Findings: AIMS: Facial and Oral Movements Muscles of Facial Expression: None, normal Lips and Perioral Area: None, normal Jaw: None, normal Tongue: None, normal,Extremity Movements Upper (arms, wrists, hands, fingers): None, normal Lower (legs, knees, ankles, toes): None, normal, Trunk Movements Neck, shoulders, hips: None, normal, Overall Severity Severity of abnormal movements (highest score from questions above): None, normal Incapacitation due to abnormal movements: None, normal Patient's awareness of abnormal movements (rate only patient's report):  No Awareness, Dental Status Current problems with teeth and/or dentures?: Yes Does patient usually wear dentures?: No  CIWA:    COWS:     Musculoskeletal: Strength & Muscle Tone: hemiparesia  Gait & Station: ambulates slowly with cane  Patient leans: N/A  Psychiatric Specialty Exam: Physical Exam  ROS denies chest pain, no shortness of breath, no vomiting. Denies cough. Reports lightheadedness .  Blood pressure 103/75, pulse 77, temperature 97.9 F (36.6 C), temperature source Oral, resp. rate 18, height '5\' 10"'$  (1.778 m), weight 98 kg, SpO2 97 %.Body mass index is 30.99 kg/m.  General Appearance: improving grooming   Eye Contact:  Good  Speech:  Normal Rate  Volume:  Normal  Mood:  improving mood   Affect:  noted to be more reactive, smiles at times appropriately  Thought Process:  Linear and Descriptions of Associations: Intact  Orientation:  Other:  fully alert and attentive  Thought Content:  denies hallucinations, no delusions expressed   Suicidal Thoughts:  No currently denies suicidal ideations, contracts for safety on unit, also denies homicidal or violent ideations - see above   Homicidal Thoughts:  No, specifically also denies any violent or homicidal ideations towards " Anthony [name]" ( individual he had mentioned at admission)   Memory:  recent and remote grossly intact   Judgement:  improving   Insight:  Fair/ improving   Psychomotor Activity:  Normal  Concentration:  Concentration: Good and Attention Span: Good  Recall:  Good  Fund of Knowledge:  Good  Language:  Good  Akathisia:  Negative  Handed:  Right  AIMS (  if indicated):     Assets:  Desire for Improvement Resilience  ADL's:  Intact  Cognition:  WNL  Sleep:  Number of Hours: 6.25   Assessment -  58 year old male, lives alone , on disability. Presented for worsening depression, neuro-vegetative symptoms, and suicidal ideations of cutting his throat. Attributed depression to recent anniversary of father's  death.  He also reported , on admission, some HI towards a peer named " Anthony [name]", because he was angry this person constantly asked him for his pipe/smoke. He has been diagnosed with Bipolar Disorder in the past , and reports history of PTSD.  Patient is reporting improving mood and presents with fuller range of affect. No SI, no HI at this time. He reports persistent sense of  dizziness, lightheadedness ( which he attributes to Abilify) , even after dose was lowered. He states Seroquel is better tolerated, although has caused some sedation. Reports Effexor XR was helpful for PTSD,anxiety symptoms and does not remember having had side effects.  Treatment Plan Summary: Daily contact with patient to assess and evaluate symptoms and progress in treatment, Medication management, Plan inpatient treatment ' and medications as below  Treatment Plan reviewed as below today 5/2  Encourage group and milieu participation Continue  Seroquel at 50  QHS for mood disorder D/C Abilify Start Effexor XR 37.5 mgr QDAY for depression,anxiety/PTSD  Decrease Abilify to 2 mgrs QDAY for mood disorder- same rational as above, will titrate as tolerated  Continue Ativan 0.5 mgr Q 6 hours PRN for anxiety Treatment team working on disposition planning options Jenne Campus, MD 03/31/2019, 3:50 PM   Patient ID: Anthony [name], male   DOB: 1961/05/17, 58 y.o.   MRN: [id number]

## 2019-03-31 NOTE — [facility] Group Notes (Signed)
[facility] Group Notes:  (Nursing)  Date:  03/31/2019  Time: 100 PM Type of Therapy:  Nurse Education  Participation Level:  Active  Participation Quality:  Appropriate  Affect:  Appropriate  Cognitive:  Appropriate  Insight:  Appropriate  Engagement in Group:  Improving  Modes of Intervention:  Education  Summary of Progress/Problems: Life Skills Group  Shela Nevin 03/31/2019, 6:12 PM
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
## 2019-03-31 NOTE — Progress Notes (Signed)
D.  Pt pleasant on approach, denies complaints at this time.  Pt was positive for evening wrap up group, observed engaged in appropriate interaction with peers on the unit.  Pt denies SI/HI/AVH at this time.  A.  Support and encouragement offered, medication given as ordered  R.  Pt remains safe on the unit, will continue to monitor.  

## 2019-03-31 NOTE — Progress Notes (Signed)
Adult Psychoeducational Group Note  Date:  03/31/2019 Time:  8:48 PM  Group Topic/Focus:  Wrap-Up Group:   The focus of this group is to help patients review their daily goal of treatment and discuss progress on daily workbooks.  Participation Level:  Active  Participation Quality:  Appropriate  Affect:  Appropriate  Cognitive:  Appropriate  Insight: Appropriate  Engagement in Group:  Engaged  Modes of Intervention:  Discussion  Additional Comments:  Pt rated his day 7/10. Pt stated that he felt most at peace when he was eating lunch. His goal is to get his medications adjusted.   [name] 03/31/2019, 8:48 PM

## 2019-03-31 NOTE — Progress Notes (Signed)
[place] NOVEL CORONAVIRUS (COVID-19) DAILY CHECK-OFF SYMPTOMS - answer yes or no to each - every day NO YES  Have you had a fever in the past 24 hours?  . Fever (Temp > 37.80C / 100F) X   Have you had any of these symptoms in the past 24 hours? . New Cough .  Sore Throat  .  Shortness of Breath .  Difficulty Breathing .  Unexplained Body Aches   X   Have you had any one of these symptoms in the past 24 hours not related to allergies?   . Runny Nose .  Nasal Congestion .  Sneezing   X   If you have had runny nose, nasal congestion, sneezing in the past 24 hours, has it worsened?  X   EXPOSURES - check yes or no X   Have you traveled outside the state in the past 14 days?  X   Have you been in contact with someone with a confirmed diagnosis of COVID-19 or PUI in the past 14 days without wearing appropriate PPE?  X   Have you been living in the same home as a person with confirmed diagnosis of COVID-19 or a PUI (household contact)?    X   Have you been diagnosed with COVID-19?    X              What to do next: Answered NO to all: Answered YES to anything:   Proceed with unit schedule Follow the [facility] Inpatient Flowsheet.   

## 2019-03-31 NOTE — Progress Notes (Signed)
D. Pt reports improving mood today-friendly during interactions- observed interacting appropriately with peers and staff. Pt currently denies SI/HI and AVH  A. Labs and vitals monitored.  Pt supported emotionally and encouraged to express concerns and ask questions.   R. Pt remains safe with 15 minute checks. Will continue POC.

## 2019-03-31 NOTE — Progress Notes (Signed)
D: Patient observed in dayroom all evening with peers. Social, watching tv. Jokes with staff and peers. Affect somewhat masked and behavior can be silly and attention-seeking. Patient states, "I'm doing better. That seroquel sure helps, but I don't want that abilify again. I didn't like the way I felt. I was even drooling."  Denies pain, physical complaints. COVID-19 screen negative, afebrile. Respiratory assessment WDL.  A: Medicated per orders, no prns requested or required. Medication education provided. Level III obs in place for safety. Emotional support offered. Patient encouraged to complete Suicide Safety Plan before discharge. Encouraged to attend and participate in unit programming.  Fall prevention plan in place and reviewed with patient as pt is a high fall risk.   R: Patient verbalizes understanding of POC, falls prevention education. Patient denies SI/HI/AVH and remains safe on level III obs. Will continue to monitor throughout the night.

## 2019-04-01 DIAGNOSIS — F314 Bipolar disorder, current episode depressed, severe, without psychotic features: Secondary | ICD-10-CM

## 2019-04-01 NOTE — [facility] Group Notes (Signed)
[facility] Group Notes:  (Nursing/MHT/Case Management/Adjunct)  Date:  04/01/2019  Time:  9:21 AM  Type of Therapy:  Goal Setting  Participation Level:  Active  Participation Quality:  Appropriate and Sharing  Affect:  Appropriate  Cognitive:  Lacking  Insight:  Improving  Engagement in Group:  Engaged  Modes of Intervention:  Discussion and Exploration  Summary of Progress/Problems: Anthony [name]'s Treatment goal is to feel better about himself and praying.  Breionna Punt R Pria Klosinski 04/01/2019, 9:21 AM

## 2019-04-01 NOTE — Progress Notes (Addendum)
Patient ID: Anthony [name], male   DOB: 1961/11/05, 58 y.o.   MRN: [id number] [name] MD Progress Note  04/01/2019 8:55 AM Anthony [name]  MRN:  [id number]   Subjective:  "I'm feeling great.  I ate two trays of breakfast."  1/10 depression but some anxiety lingers.  His sleep was good and preparing for discharge tomorrow.  Objective : History:  58 year old male, lives alone , on disability. Presented for worsening depression, neuro-vegetative symptoms, and suicidal ideations of cutting his throat. Attributed depression to recent anniversary of father's death. He also reported , on admission, some HI towards a peer named " Anthony [name]", because he was angry this person constantly asked him for his pipe/smoke.He has been diagnosed with Bipolar Disorder in the past , and reports history of PTSD.   Evaluation today:   Chart reviewed including notes, met patient face-face.  Minimal depression and anxiety, denies suicidal/homicidal ideations and withdrawal symptoms.  Last use of cocaine was two weeks ago but uses cannabis daily.  His plan is to stop using substances after discharge but is not able to verbalize how he is going to manage this.  He was eating heartily prior to assessment, reports his sleep was good.  Feels he has managed to get through the time of his father passing (6 years ago last week).  Anthony [name]'s goal is to work on coping skills to use after discharge.  No complaints of adverse medication side effects today.    Principal Problem: Bipolar I disorder, most recent episode depressed, severe without psychotic features ([place]) Diagnosis: Principal Problem:   Bipolar I disorder, most recent episode depressed, severe without psychotic features ([place]) Active Problems:   Cannabis use disorder, moderate, dependence ([place])   MDD (major depressive disorder)   Moderate bipolar I disorder, most recent episode depressed ([place])   Substance induced mood disorder ([place])  Total Time spent with patient: 15  minutes  Past Psychiatric History:   Past Medical History:  Past Medical History:  Diagnosis Date  . Bell's palsy   . Cannabis abuse   . Cellulitis of right leg 04/2017  . Chronic left shoulder pain   . Depression   . Encephalitis   . Episodic mood disorder ([place])   . Homelessness   . Post traumatic stress disorder   . Seizures ([place])    last seizure 12 yrs ago  . Sepsis ([place]) 04/2017    Past Surgical History:  Procedure Laterality Date  . APPENDECTOMY    . brain damage    . CHOLECYSTECTOMY    . FOOT SURGERY Bilateral   . KNEE SURGERY Right    Family History:  Family History  Problem Relation Age of Onset  . Cancer Mother   . Heart attack Father   . Mental illness Sister   . Alcoholism Brother    Family Psychiatric  History:  Social History:  Social History   Substance and Sexual Activity  Alcohol Use No     Social History   Substance and Sexual Activity  Drug Use Yes  . Frequency: 2.0 times per week  . Types: Marijuana   Comment: Denies but has a history.     Social History   Socioeconomic History  . Marital status: Divorced    Spouse name: Not on file  . Number of children: Not on file  . Years of education: Not on file  . Highest education level: Not on file  Occupational History  . Not on file  Social Needs  .  Financial resource strain: Not on file  . Food insecurity:    Worry: Not on file    Inability: Not on file  . Transportation needs:    Medical: Not on file    Non-medical: Not on file  Tobacco Use  . Smoking status: Current Every Day Smoker    Packs/day: 1.00    Years: 38.00    Pack years: 38.00    Types: Cigarettes  . Smokeless tobacco: Never Used  Substance and Sexual Activity  . Alcohol use: No  . Drug use: Yes    Frequency: 2.0 times per week    Types: Marijuana    Comment: Denies but has a history.   . Sexual activity: Yes    Birth control/protection: Condom  Lifestyle  . Physical activity:    Days per week: Not on  file    Minutes per session: Not on file  . Stress: Not on file  Relationships  . Social connections:    Talks on phone: Not on file    Gets together: Not on file    Attends religious service: Not on file    Active member of club or organization: Not on file    Attends meetings of clubs or organizations: Not on file    Relationship status: Not on file  Other Topics Concern  . Not on file  Social History Narrative  . Not on file   Additional Social History:    Pain Medications: see MAR Prescriptions: see MAR Over the Counter: see MAR History of alcohol / drug use?: No history of alcohol / drug abuse  Sleep: improved   Appetite:  Good  Current Medications: Current Facility-Administered Medications  Medication Dose Route Frequency Provider Last Rate Last Dose  . acetaminophen (TYLENOL) tablet 650 mg  650 mg Oral Q6H PRN Mordecai Maes, NP   650 mg at 04/01/19 0841  . alum & mag hydroxide-simeth (MAALOX/MYLANTA) 200-200-20 MG/5ML suspension 30 mL  30 mL Oral Q4H PRN Mordecai Maes, NP   30 mL at 03/29/19 1728  . LORazepam (ATIVAN) tablet 0.5 mg  0.5 mg Oral Q6H PRN Yuktha Kerchner, Myer Peer, MD      . magnesium hydroxide (MILK OF MAGNESIA) suspension 30 mL  30 mL Oral Daily PRN Mordecai Maes, NP      . nicotine (NICODERM CQ - dosed in mg/24 hours) patch 21 mg  21 mg Transdermal Daily Lonnell Chaput, Myer Peer, MD   21 mg at 04/01/19 3532  . QUEtiapine (SEROQUEL) tablet 50 mg  50 mg Oral QHS Quindarius Cabello A, MD      . venlafaxine XR (EFFEXOR-XR) 24 hr capsule 37.5 mg  37.5 mg Oral Q breakfast Noelia Lenart, Myer Peer, MD   37.5 mg at 04/01/19 9924    Lab Results:  No results found for this or any previous visit (from the past 48 hour(s)).  Blood Alcohol level:  Lab Results  Component Value Date   ETH <10 03/29/2019   ETH <10 26/83/4196    Metabolic Disorder Labs: Lab Results  Component Value Date   HGBA1C 5.7 (H) 03/29/2019   MPG 116.89 03/29/2019   MPG 108.28 09/13/2017   Lab  Results  Component Value Date   PROLACTIN 15.5 (H) 12/11/2016   Lab Results  Component Value Date   CHOL 144 03/29/2019   TRIG 26 03/29/2019   HDL 59 03/29/2019   CHOLHDL 2.4 03/29/2019   VLDL 5 03/29/2019   LDLCALC 80 03/29/2019   LDLCALC 87 09/13/2017  Physical Findings: AIMS: Facial and Oral Movements Muscles of Facial Expression: None, normal Lips and Perioral Area: None, normal Jaw: None, normal Tongue: None, normal,Extremity Movements Upper (arms, wrists, hands, fingers): None, normal Lower (legs, knees, ankles, toes): None, normal, Trunk Movements Neck, shoulders, hips: None, normal, Overall Severity Severity of abnormal movements (highest score from questions above): None, normal Incapacitation due to abnormal movements: None, normal Patient's awareness of abnormal movements (rate only patient's report): No Awareness, Dental Status Current problems with teeth and/or dentures?: Yes Does patient usually wear dentures?: No   Musculoskeletal: Strength & Muscle Tone: hemiparesia  Gait & Station: ambulates slowly with cane  Patient leans: N/A  Psychiatric Specialty Exam: Physical Exam  Nursing note and vitals reviewed. Constitutional: He appears well-developed and well-nourished.  HENT:  Head: Normocephalic.  Neck: Normal range of motion.  Respiratory: Effort normal.  Musculoskeletal:     Comments: Walks with a cane  Neurological: He is alert.  Psychiatric: His speech is normal and behavior is normal. Judgment and thought content normal. His mood appears anxious. Cognition and memory are normal. He exhibits a depressed mood.    Review of Systems  Psychiatric/Behavioral: Positive for depression and substance abuse. The patient is nervous/anxious.   All other systems reviewed and are negative.  denies chest pain, no shortness of breath, no vomiting. Denies cough. Reports lightheadedness .  Blood pressure 103/75, pulse 77, temperature 97.9 F (36.6 C),  temperature source Oral, resp. rate 18, height '5\' 10"'$  (1.778 m), weight 98 kg, SpO2 97 %.Body mass index is 30.99 kg/m.  General Appearance: Good  Eye Contact:  Good  Speech:  Normal Rate  Volume:  Normal  Mood:  Low level of depression and anxiety  Affect:  noted to be more reactive, smiles at times appropriately  Thought Process:  Linear and Descriptions of Associations: Intact  Orientation:  Other:  fully alert and attentive  Thought Content:  denies hallucinations, no delusions expressed   Suicidal Thoughts:  No   Homicidal Thoughts:  No,   Memory:  Recent, good; immediate, good; remote, good  Judgement:  Fair  Insight:  Fair/ improving   Psychomotor Activity:  Normal  Concentration:  Concentration: Good and Attention Span: Good  Recall:  Good  Fund of Knowledge:  Good  Language:  Good  Akathisia:  Negative  Handed:  Right  AIMS (if indicated):     Assets:  Desire for Improvement Resilience  ADL's:  Intact  Cognition:  WNL  Sleep:  Number of Hours: 6    Treatment Plan Summary: Daily contact with patient to assess and evaluate symptoms and progress in treatment, Medication management, Plan inpatient treatment ' and medications as below  Treatment Plan reviewed as below today 5/2   Bipolar affective disorder, depressed, mild (today) Continue Effexor XR 37.5 daily  -Continue Seroquel 50 mg at bedtime for mood and sleep  Insomnia  -Continue Seroquel 50 mg at bedtime   Anxiety: -Discontinue Ativan 0.5 mg every six hours PRN anxiety  -Encourage group and milieu participation -Individual and group therapy -Treatment team working on disposition planning options  -Discharge planned for 04/02/19  Waylan Boga, NP 04/01/2019, 8:55 AM   Attest to NP Progress Note

## 2019-04-01 NOTE — Progress Notes (Signed)
[place] NOVEL CORONAVIRUS (COVID-19) DAILY CHECK-OFF SYMPTOMS - answer yes or no to each - every day NO YES  Have you had a fever in the past 24 hours?  . Fever (Temp > 37.80C / 100F) X   Have you had any of these symptoms in the past 24 hours? . New Cough .  Sore Throat  .  Shortness of Breath .  Difficulty Breathing .  Unexplained Body Aches   X   Have you had any one of these symptoms in the past 24 hours not related to allergies?   . Runny Nose .  Nasal Congestion .  Sneezing   X   If you have had runny nose, nasal congestion, sneezing in the past 24 hours, has it worsened?  X   EXPOSURES - check yes or no X   Have you traveled outside the state in the past 14 days?  X   Have you been in contact with someone with a confirmed diagnosis of COVID-19 or PUI in the past 14 days without wearing appropriate PPE?  X   Have you been living in the same home as a person with confirmed diagnosis of COVID-19 or a PUI (household contact)?    X   Have you been diagnosed with COVID-19?    X              What to do next: Answered NO to all: Answered YES to anything:   Proceed with unit schedule Follow the [facility] Inpatient Flowsheet.   

## 2019-04-01 NOTE — [facility] Group Notes (Signed)
[facility] LCSW Group Therapy Note  04/01/2019  10:00-11:00AM  Type of Therapy and Topic:  Group Therapy:  Adding Supports Including Yourself  Participation Level:  Active   Description of Group:  Patients in this group were introduced to the concept that additional supports including self-support are an essential part of recovery.  Patients listed what supports they believe they need to add to their lives to achieve their goals at discharge, and they listed such things as therapist, family, doctor, support groups, and service dog.  CSW described the  continuum of mental health/substance abuse services available and the group discussed the differences among these including support group, therapy group, 12-step group, Doctor, hospital, Secondary school teacher, and such.  A song entitled "My Own Hero" was played and a group discussion ensued in which patients stated they could relate to the song and it inspired them to realize they have be willing to help themselves in order to succeed, because other people cannot achieve sobriety or stability for them.  A song was played called "I Am Enough" which led to a discussion about being willing to believe we are worth the effort of being a self-support.  Group members expressed appreciation for each other.  Therapeutic Goals: 1)  demonstrate the importance of being a key part of one's own support system 2)  discuss various available supports 3)  encourage patient to use music as part of their self-support and focus on goals 4)  elicit ideas from patients about supports that need to be added   Summary of Patient Progress:  The patient expressed a support that he would like to add is taking his medication regularly instead of self-medicating, and he agreed what this meant is he has to improve his own self-support.   Therapeutic Modalities:   Motivational Interviewing Activity  Lynnell Chad

## 2019-04-01 NOTE — Progress Notes (Signed)
D. Pt is friendly upon approach- calm and cooperative behavior. Observed interacting appropriately with peers in the dayroom, attending Rec therapy. Per pt's self inventory, pt rates his depression, hopelessness and anxiety all 0's. Pt currently denies SI/HI and AVH and agrees to contact staff before acting on any harmful thoughts.  A. Labs and vitals monitored. Pt compliant with medications. Pt supported emotionally and encouraged to express concerns and ask questions.   R. Pt remains safe with 15 minute checks. Will continue POC.

## 2019-04-02 MED ORDER — QUETIAPINE FUMARATE 25 MG PO TABS: 25.0000 mg | ORAL_TABLET | Freq: Every day | ORAL | Status: DC

## 2019-04-02 MED ORDER — QUETIAPINE FUMARATE 50 MG PO TABS: 50.0000 mg | ORAL_TABLET | Freq: Every day | ORAL | 0 refills | Status: DC

## 2019-04-02 MED ORDER — QUETIAPINE FUMARATE 25 MG PO TABS: 25.0000 mg | ORAL_TABLET | Freq: Every day | ORAL | 0 refills | Status: AC

## 2019-04-02 MED ORDER — VENLAFAXINE HCL ER 37.5 MG PO CP24: 37.5000 mg | ORAL_CAPSULE | Freq: Every day | ORAL | 0 refills | Status: AC

## 2019-04-02 NOTE — Discharge Summary (Addendum)
Physician Discharge Summary Note  Patient:  Anthony [name] is an [age] y.o., male MRN:  [id number] DOB:  05/10/1961 Patient phone:  [phone] (home)  Patient addRandolph [name]:   [name],  Total Time spent with patient: 20 minutes  Date of Admission:  03/28/2019 Date of Discharge: 04/02/19  Reason for Admission:  Worsening depression with SI  Principal Problem: Bipolar I disorder, most recent episode depressed, severe without psychotic features [facility](HCC) Discharge Diagnoses: Principal Problem:   Bipolar I disorder, most recent episode depressed, severe without psychotic features (HCC) Active Problems:   Cannabis use disorder, moderate, dependence (HCC)   MDD (major depressive disorder)   Moderate bipolar I disorder, most recent episode depressed (HCC)   Substance induced mood disorder (HCC)   Past Psychiatric History: History of multiple hospitalizations, most recently at [facility] months ago for SI. One suicide attempt via overdose on Ambien about 15 years ago. Remote history of alcohol abuse but denies any alcohol use over the last 20 years. Denies history of self-injurious behaviors, mania, psychosis or panic attacks  Past Medical History:  Past Medical History:  Diagnosis Date  . Bell's palsy   . Cannabis abuse   . Cellulitis of right leg 04/2017  . Chronic left shoulder pain   . Depression   . Encephalitis   . Episodic mood disorder (HCC)   . Homelessness   . Post traumatic stress disorder   . Seizures (HCC)    last seizure 12 yrs ago  . Sepsis (HCC) 04/2017    Past Surgical History:  Procedure Laterality Date  . APPENDECTOMY    . brain damage    . CHOLECYSTECTOMY    . FOOT SURGERY Bilateral   . KNEE SURGERY Right    Family History:  Family History  Problem Relation Age of Onset  . Cancer Mother   . Heart attack Father   . Mental illness Sister   . Alcoholism Brother    Family Psychiatric  History: Brother with bipolar  disorder, PTSD and alcohol use disorder. Sister with schizophrenia. Uncle with alcohol use disorder. Social History:  Social History   Substance and Sexual Activity  Alcohol Use No     Social History   Substance and Sexual Activity  Drug Use Yes  . Frequency: 2.0 times per week  . Types: Marijuana   Comment: Denies but has a history.     Social History   Socioeconomic History  . Marital status: Divorced    Spouse name: Not on file  . Number of children: Not on file  . Years of education: Not on file  . Highest education level: Not on file  Occupational History  . Not on file  Social Needs  . Financial resource strain: Not on file  . Food insecurity:    Worry: Not on file    Inability: Not on file  . Transportation needs:    Medical: Not on file    Non-medical: Not on file  Tobacco Use  . Smoking status: Current Every Day Smoker    Packs/day: 1.00    Years: 38.00    Pack years: 38.00    Types: Cigarettes  . Smokeless tobacco: Never Used  Substance and Sexual Activity  . Alcohol use: No  . Drug use: Yes    Frequency: 2.0 times per week    Types: Marijuana    Comment: Denies but has a history.   . Sexual activity: Yes    Birth  control/protection: Condom  Lifestyle  . Physical activity:    Days per week: Not on file    Minutes per session: Not on file  . Stress: Not on file  Relationships  . Social connections:    Talks on phone: Not on file    Gets together: Not on file    Attends religious service: Not on file    Active member of club or organization: Not on file    Attends meetings of clubs or organizations: Not on file    Relationship status: Not on file  Other Topics Concern  . Not on file  Social History Narrative  . Not on file    Hospital Course:   MD Assessment 03/29/19: 58 year old male with history of viral encephalitis with left sided hemiparesis, depression, anxiety, PTSD, and alcohol/cocaine use presenting for treatment of depression with  suicidal ideation. From MD's admission SRA: Presented due to worsening depression over the last 1 to 2 weeks. Attributes worsening mood in part to father's death anniversary last week.He describes neurovegetative symptoms of depression including poor sleep, low energy, anhedonia, which he describes as mild. He reports recent suicidal ideations,with thoughts of cutting his throat. Marland Kitchen Healso reports he has been upset/angry at a peer at [employer] "he keeps on asking for my pipeand he will notstop ".He states he was having homicidal thoughts towards this person ( named Anthony [name]),but denies any homicidal ideations at this time.Patient reports history of bipolar disorder diagnoses. He also states he has been diagnosed with PTSD stemming from history of combat experiences when young. He described Seroquel and the Jordan. He feels that the Seroquel (which she has been on for several years)has been helpful but he does not feel the Kasandra Knudsen is helping much and would prefer to change his medication.States he has been on several different medications in the past,remembers Abilify is helpful.Took it (in combination with Seroquel) for about 1 to 2 years in the past, does not member having had side effects.He describes a history of cocaine and cannabis abuse. States he uses cocaine regularly, last used about 2 weeks ago. Uses cannabis almost every day.  Patient remained on the [facility] unit for 4 days. The patient stabilized on medication and therapy. Patient was discharged on Seroquel 25 mg QHS and Effexor-XR 37.5 mg Daily. Patient has shown improvement with improved mood, affect, sleep, appetite, and interaction. Patient has attended group and participated. Patient has been seen in the day room interacting with peers and staff appropriately. Patient denies any SI/HI/AVH and contracts for safety. Patient agrees to follow up at [facility]. Patient is provided with prescriptions for their medications upon  discharge.   Physical Findings: AIMS: Facial and Oral Movements Muscles of Facial Expression: None, normal Lips and Perioral Area: None, normal Jaw: None, normal Tongue: None, normal,Extremity Movements Upper (arms, wrists, hands, fingers): None, normal Lower (legs, knees, ankles, toes): None, normal, Trunk Movements Neck, shoulders, hips: None, normal, Overall Severity Severity of abnormal movements (highest score from questions above): None, normal Incapacitation due to abnormal movements: None, normal Patient's awareness of abnormal movements (rate only patient's report): No Awareness, Dental Status Current problems with teeth and/or dentures?: Yes Does patient usually wear dentures?: No  CIWA:    COWS:     Musculoskeletal: Strength & Muscle Tone: within normal limits Gait & Station: unsteady, uses a cane Patient leans: N/A  Psychiatric Specialty Exam: Physical Exam  Nursing note and vitals reviewed. Constitutional: He is oriented to person, place, and time. He appears well-developed  and well-nourished.  Cardiovascular: Normal rate.  Respiratory: Effort normal.  Musculoskeletal: Normal range of motion.  Neurological: He is alert and oriented to person, place, and time.  Skin: Skin is warm.    Review of Systems  Constitutional: Negative.   HENT: Negative.   Eyes: Negative.   Respiratory: Negative.   Cardiovascular: Negative.   Gastrointestinal: Negative.   Genitourinary: Negative.   Musculoskeletal: Negative.   Skin: Negative.   Neurological: Negative.   Endo/Heme/Allergies: Negative.   Psychiatric/Behavioral: Negative.     Blood pressure 109/83, pulse 76, temperature 98.2 F (36.8 C), temperature source Oral, resp. rate 18, height  (1.778 m), weight 98 kg, SpO2 93 %.Body mass index is 30.99 kg/m.  General Appearance: Casual  Eye Contact:  Good  Speech:  Clear and Coherent and Normal Rate  Volume:  Normal  Mood:  Euthymic  Affect:  Congruent  Thought  Process:  Coherent and Descriptions of Associations: Intact  Orientation:  Full (Time, Place, and Person)  Thought Content:  WDL  Suicidal Thoughts:  No  Homicidal Thoughts:  No  Memory:  Immediate;   Good Recent;   Good Remote;   Good  Judgement:  Good  Insight:  Good  Psychomotor Activity:  Normal  Concentration:  Concentration: Good  Recall:  Good  Fund of Knowledge:  Good  Language:  Good  Akathisia:  No  Handed:  Right  AIMS (if indicated):     Assets:  Communication Skills Desire for Improvement Financial Resources/Insurance Housing Physical Health Social Support Transportation  ADL's:  Intact  Cognition:  WNL  Sleep:  Number of Hours: 6.25        Has this patient used any form of tobacco in the last 30 days? (Cigarettes, Smokeless Tobacco, Cigars, and/or Pipes) Yes, No  Blood Alcohol level:  Lab Results  Component Value Date   ETH <10 03/29/2019   ETH <10 01/05/2018    Metabolic Disorder Labs:  Lab Results  Component Value Date   HGBA1C 5.7 (H) 03/29/2019   MPG 116.89 03/29/2019   MPG 108.28 09/13/2017   Lab Results  Component Value Date   PROLACTIN 15.5 (H) 12/11/2016   Lab Results  Component Value Date   CHOL 144 03/29/2019   TRIG 26 03/29/2019   HDL 59 03/29/2019   CHOLHDL 2.4 03/29/2019   VLDL 5 03/29/2019   LDLCALC 80 03/29/2019   LDLCALC 87 09/13/2017    See Psychiatric Specialty Exam and Suicide Risk Assessment completed by Attending Physician prior to discharge.  Discharge destination:  Home  Is patient on multiple antipsychotic therapies at discharge:  No   Has Patient had three or more failed trials of antipsychotic monotherapy by history:  No  Recommended Plan for Multiple Antipsychotic Therapies: NA   Allergies as of 04/02/2019      Reactions   Nuedexta [dextromethorphan-quinidine] Other (See Comments)   hallucinations      Medication List    STOP taking these medications   fluticasone 50 MCG/ACT nasal spray Commonly  known as:  FLONASE   gabapentin 600 MG tablet Commonly known as:  NEURONTIN   hydrOXYzine 50 MG tablet Commonly known as:  ATARAX/VISTARIL   Latuda 20 MG Tabs tablet Generic drug:  lurasidone   pantoprazole 40 MG tablet Commonly known as:  PROTONIX     TAKE these medications     Indication  albuterol 108 (90 Base) MCG/ACT inhaler Commonly known as:  VENTOLIN HFA Inhale 1 puff into the lungs every 6 (six)  hours as needed for wheezing or shortness of breath.  Indication:  Chronic Obstructive Lung Disease   loratadine 10 MG tablet Commonly known as:  CLARITIN Take 1 tablet (10 mg total) by mouth daily.  Indication:  Hayfever   QUEtiapine 25 MG tablet Commonly known as:  SEROQUEL Take 1 tablet (25 mg total) by mouth at bedtime.  Indication:  mood stability   venlafaxine XR 37.5 MG 24 hr capsule Commonly known as:  EFFEXOR-XR Take 1 capsule (37.5 mg total) by mouth daily with breakfast. Start taking on:  Apr 03, 2019 What changed:    medication strength  how much to take  Indication:  Major Depressive Disorder      Follow-up Information    [facility] Follow up on 04/03/2019.   Specialty:  Family Medicine Why:  Medication management appointment with Arn Medal is Tuesday, 5/5 at 9:30a.  Please bring your current medications and discharge paperwork from this hospitalization.  Contact information: [address] [place] [address] [phone]           Follow-up recommendations:  Continue activity as tolerated. Continue diet as recommended by your PCP. Ensure to keep all appointments with outpatient providers.  Comments:  Patient is instructed prior to discharge to: Take all medications as prescribed by his/her mental healthcare provider. Report any adverse effects and or reactions from the medicines to his/her outpatient provider promptly. Patient has been instructed & cautioned: To not engage in alcohol and or  illegal drug use while on prescription medicines. In the event of worsening symptoms, patient is instructed to call the crisis hotline, 911 and or go to the nearest ED for appropriate evaluation and treatment of symptoms. To follow-up with his/her primary care provider for your other medical issues, concerns and or health care needs.    Signed: Gerlene Burdock Money, FNP 04/02/2019, 10:53 AM   Patient seen, Suicide Assessment Completed.  Disposition Plan Reviewed

## 2019-04-02 NOTE — Progress Notes (Signed)
Recreation Therapy Notes  Date:  5.4.20 Time: 0930 Location: 300 Hall Dayroom  Group Topic: Stress Management  Goal Area(s) Addresses:  Patient will identify positive stress management techniques. Patient will identify benefits of using stress management post d/c.  Intervention: Stress Management  Activity :  Meditation.  LRT introduced the stress management technique of meditation.  The meditation focused on being resilient in the face of adversity.  Patients were to listen as meditation played in order to engage in activity.  Education:  Stress Management, Discharge Planning.   Education Outcome: Acknowledges Education  Clinical Observations/Feedback: Pt did not attend group.     Anthony [name], LRT/CTRS         Anthony [name] 04/02/2019 10:51 AM 

## 2019-04-02 NOTE — BHH Suicide Risk Assessment (Signed)
[facility] Discharge Suicide Risk Assessment   Principal Problem: Bipolar I disorder, most recent episode depressed, severe without psychotic features [facility]) Discharge Diagnoses: Principal Problem:   Bipolar I disorder, most recent episode depressed, severe without psychotic features (HCC) Active Problems:   Cannabis use disorder, moderate, dependence (HCC)   MDD (major depressive disorder)   Moderate bipolar I disorder, most recent episode depressed (HCC)   Substance induced mood disorder (HCC)   Total Time spent with patient: 30 minutes  Musculoskeletal: Strength & Muscle Tone:  L side hemiparesia Gait & Station: walks with cane  Patient leans: N/A  Psychiatric Specialty Exam: ROS no shortness of breath, no coughing , no fever or chills   Blood pressure 109/83, pulse 76, temperature 98.2 F (36.8 C), temperature source Oral, resp. rate 18, height 5\' 10"  (1.778 m), weight 98 kg, SpO2 93 %.Body mass index is 30.99 kg/m.  General Appearance: improved grooming   Eye Contact::  Good  Speech:  Normal Rate409  Volume:  Normal  Mood:  reports mood " back to normal", and presents euthymic at present   Affect:  more reactive  Thought Process:  Linear and Descriptions of Associations: Intact  Orientation:  Full (Time, Place, and Person)  Thought Content:  denies hallucinations, no delusions   Suicidal Thoughts:  No denies suicidal or self injurious ideations, denies homicidal or violent ideations, and specifically continues to deny violent or homicidal ideations towards the person ( " Sheria Lang") he had identified on admission  Homicidal Thoughts:  No  Memory:  recent and remote grossly intact   Judgement:  Other:  improving  Insight:  improving  Psychomotor Activity:  Normal  Concentration:  Good  Recall:  Good  Fund of Knowledge:Good  Language: Good  Akathisia:  Negative  Handed:  Right  AIMS (if indicated):     Assets:  Communication Skills Desire for Improvement Resilience  Sleep:   Number of Hours: 6.25  Cognition: WNL  ADL's:  Intact   Mental Status Per Nursing Assessment::   On Admission:  Suicidal ideation indicated by patient, Plan includes specific time, place, or method, Intention to act on suicide plan, Self-harm thoughts, Belief that plan would result in death, Suicide plan  Demographic Factors:  58 year old male.  Divorced.  Lives alone.  On SSDI.  2 adult children.  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 Denies SI or HI, denies hallucinations.  Denies medication side effects at this time. Thus far tolerating Effexor XR trial well. States Seroquel has been effective and well tolerated at low doses , but attempts to titrate dose have caused excessive sedation. Currently no sedation and presents fully alert and attentive  Cognitive Features That Contribute To Risk:  No gross cognitive deficits noted upon discharge. Is alert , attentive, and oriented x 3   Suicide Risk:  Mild:  Suicidal ideation of limited frequency, intensity, duration, and specificity.  There are no identifiable plans, no associated intent, mild dysphoria and related symptoms, good self-control (both objective and subjective assessment), few other risk factors, and identifiable protective factors, including available and accessible social  support.  Follow-up Information    [facility] Follow up.   Specialty:  Family Medicine Contact information: [address] [place]  [id number] [phone]           Plan Of Care/Follow-up recommendations:  Activity:  as tolerated  Diet:  regular Tests:  NA Other:  See below  Patient is expressing readiness for discharge, and is leaving unit in good spirits  Plans to return home Plans to follow up as above   Craige CottaFernando A Marlen Mollica, MD 04/02/2019, 9:10 AM

## 2019-04-02 NOTE — Progress Notes (Signed)
Pt discharged to lobby. Pt was stable and appreciative at that time. All papers and prescriptions were given and valuables returned. Verbal understanding expressed. Denies SI/HI and A/VH. Pt given opportunity to express concerns and ask questions.  

## 2019-04-02 NOTE — Progress Notes (Signed)
D: Patient observed in dayroom all evening watching TV, interacting with peers. Patient states, "I am out of here tomorrow. I feel ready to go. The seroquel has really helped." Patient's affect animated, mood pleasant. Denies pain, physical complaints. COVID-19 screen negative, afebrile. Respiratory assessment WDL.  A: Medicated per orders however patient would only take 25mg  of prescribed 50mg  of seroquel. "I told Dr. Jama Flavors and I watched him change it. 50mg  makes me way too sleepy the next day." No prns requested or required. Medication education provided. Level III obs in place for safety. Emotional support offered. Patient encouraged to complete Suicide Safety Plan before discharge. Encouraged to attend and participate in unit programming.  Fall prevention plan in place and reviewed with patient as pt is a high fall risk.   R: Patient verbalizes understanding of POC, falls prevention education. Patient denies SI/HI/AVH and remains safe on level III obs. Will continue to monitor throughout the night.

## 2019-04-02 NOTE — Progress Notes (Signed)
  [facility] Adult Case Management Discharge Plan :  Will you be returning to the same living situation after discharge:  Yes,  patient reports he is returning to his apartment  At discharge, do you have transportation home?: Yes,  taxi voucher Do you have the ability to pay for your medications: Yes,  [employer] Medicare, SSDI  Release of information consent forms completed and in the chart;  Patient's signature needed at discharge.  Patient to Follow up at: Follow-up Information    [facility] Follow up on 04/03/2019.   Specialty:  Family Medicine Why:  Medication management appointment with Arn Medal is Tuesday, 5/5 at 9:30a.  Please bring your current medications and discharge paperwork from this hospitalization.  Contact information: [address] [place] [address] [phone]           Next level of care provider has access to [facility] Link:yes  Safety Planning and Suicide Prevention discussed: Yes,  with the patient      Has patient been referred to the Quitline?: N/A patient is not a smoker  Patient has been referred for addiction treatment: N/A  [name], LCSWA 04/02/2019, 9:57 AM

## 2019-06-22 ENCOUNTER — Other Ambulatory Visit: Payer: Self-pay

## 2019-06-22 ENCOUNTER — Encounter (HOSPITAL_COMMUNITY): Payer: Self-pay | Admitting: Psychiatry

## 2019-06-22 ENCOUNTER — Ambulatory Visit (HOSPITAL_COMMUNITY): Payer: Medicare HMO | Admitting: Psychiatry

## 2019-06-29 ENCOUNTER — Ambulatory Visit (INDEPENDENT_AMBULATORY_CARE_PROVIDER_SITE_OTHER): Payer: Medicare HMO | Admitting: Licensed Clinical Social Worker

## 2019-06-29 ENCOUNTER — Other Ambulatory Visit: Payer: Self-pay

## 2019-06-29 DIAGNOSIS — F322 Major depressive disorder, single episode, severe without psychotic features: Secondary | ICD-10-CM
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
## 2019-07-12 ENCOUNTER — Ambulatory Visit (HOSPITAL_COMMUNITY): Payer: Medicare HMO | Admitting: Psychiatry

## 2019-07-12 ENCOUNTER — Other Ambulatory Visit: Payer: Self-pay
